# Patient Record
Sex: Male | Born: 1960 | Race: Black or African American | Hispanic: No | Smoking: Current every day smoker
Health system: Southern US, Community
[De-identification: ages and names within clinical notes are randomized; demographics above are authoritative.]

## PROBLEM LIST (undated history)

## (undated) DIAGNOSIS — I10 Essential (primary) hypertension: Secondary | ICD-10-CM

## (undated) DIAGNOSIS — R569 Unspecified convulsions: Secondary | ICD-10-CM

## (undated) DIAGNOSIS — F329 Major depressive disorder, single episode, unspecified: Secondary | ICD-10-CM

## (undated) DIAGNOSIS — F32A Depression, unspecified: Secondary | ICD-10-CM

## (undated) DIAGNOSIS — K279 Peptic ulcer, site unspecified, unspecified as acute or chronic, without hemorrhage or perforation: Secondary | ICD-10-CM

## (undated) DIAGNOSIS — F1010 Alcohol abuse, uncomplicated: Secondary | ICD-10-CM

## (undated) HISTORY — PX: TONSILLECTOMY: SUR1361

## (undated) HISTORY — PX: BACK SURGERY: SHX140

---

## 1992-01-09 HISTORY — PX: NECK SURGERY: SHX720

## 2006-09-20 ENCOUNTER — Emergency Department (HOSPITAL_COMMUNITY): Admission: EM | Admit: 2006-09-20 | Discharge: 2006-09-20 | Payer: Self-pay | Admitting: Emergency Medicine

## 2007-10-08 ENCOUNTER — Emergency Department (HOSPITAL_COMMUNITY): Admission: EM | Admit: 2007-10-08 | Discharge: 2007-10-09 | Payer: Self-pay | Admitting: Emergency Medicine

## 2007-10-25 ENCOUNTER — Emergency Department (HOSPITAL_COMMUNITY): Admission: EM | Admit: 2007-10-25 | Discharge: 2007-10-26 | Payer: Self-pay | Admitting: Emergency Medicine

## 2009-05-16 ENCOUNTER — Emergency Department (HOSPITAL_COMMUNITY): Admission: EM | Admit: 2009-05-16 | Discharge: 2009-05-16 | Payer: Self-pay | Admitting: Emergency Medicine

## 2010-03-28 LAB — CBC
HCT: 40.8 % (ref 39.0–52.0)
Hemoglobin: 14.1 g/dL (ref 13.0–17.0)
WBC: 5.2 10*3/uL (ref 4.0–10.5)

## 2010-03-28 LAB — HEPATIC FUNCTION PANEL
ALT: 27 U/L (ref 0–53)
Albumin: 3.5 g/dL (ref 3.5–5.2)
Bilirubin, Direct: 0.2 mg/dL (ref 0.0–0.3)
Indirect Bilirubin: 1 mg/dL — ABNORMAL HIGH (ref 0.3–0.9)
Total Bilirubin: 1.2 mg/dL (ref 0.3–1.2)

## 2010-03-28 LAB — POCT I-STAT, CHEM 8
BUN: 9 mg/dL (ref 6–23)
Calcium, Ion: 1.18 mmol/L (ref 1.12–1.32)
Chloride: 103 mEq/L (ref 96–112)
Creatinine, Ser: 0.8 mg/dL (ref 0.4–1.5)
Hemoglobin: 15.6 g/dL (ref 13.0–17.0)
Potassium: 3.7 mEq/L (ref 3.5–5.1)
Sodium: 138 mEq/L (ref 135–145)
TCO2: 27 mmol/L (ref 0–100)

## 2010-03-28 LAB — URINALYSIS, ROUTINE W REFLEX MICROSCOPIC
Glucose, UA: NEGATIVE mg/dL
Nitrite: NEGATIVE
Urobilinogen, UA: 0.2 mg/dL (ref 0.0–1.0)

## 2010-03-28 LAB — URINE CULTURE
Colony Count: NO GROWTH
Culture: NO GROWTH

## 2010-03-28 LAB — POCT CARDIAC MARKERS

## 2010-03-28 LAB — DIFFERENTIAL
Basophils Relative: 0 % (ref 0–1)
Eosinophils Absolute: 0.2 10*3/uL (ref 0.0–0.7)
Lymphocytes Relative: 16 % (ref 12–46)

## 2010-09-18 ENCOUNTER — Encounter: Payer: Self-pay | Admitting: Gastroenterology

## 2010-09-24 ENCOUNTER — Emergency Department (HOSPITAL_COMMUNITY)
Admission: EM | Admit: 2010-09-24 | Discharge: 2010-09-24 | Disposition: A | Discharge status: Home / Self Care | Payer: Self-pay | Attending: Emergency Medicine | Admitting: Emergency Medicine

## 2010-09-24 ENCOUNTER — Emergency Department (HOSPITAL_COMMUNITY): Payer: Self-pay

## 2010-09-24 DIAGNOSIS — S0990XA Unspecified injury of head, initial encounter: Secondary | ICD-10-CM | POA: Insufficient documentation

## 2010-09-24 DIAGNOSIS — Z87891 Personal history of nicotine dependence: Secondary | ICD-10-CM | POA: Insufficient documentation

## 2010-09-24 DIAGNOSIS — S02401A Maxillary fracture, unspecified, initial encounter for closed fracture: Secondary | ICD-10-CM | POA: Insufficient documentation

## 2010-09-24 DIAGNOSIS — Z79899 Other long term (current) drug therapy: Secondary | ICD-10-CM | POA: Insufficient documentation

## 2010-09-24 DIAGNOSIS — S0280XA Fracture of other specified skull and facial bones, unspecified side, initial encounter for closed fracture: Secondary | ICD-10-CM | POA: Insufficient documentation

## 2010-09-24 DIAGNOSIS — T797XXA Traumatic subcutaneous emphysema, initial encounter: Secondary | ICD-10-CM | POA: Insufficient documentation

## 2010-09-24 DIAGNOSIS — R209 Unspecified disturbances of skin sensation: Secondary | ICD-10-CM | POA: Insufficient documentation

## 2010-09-24 DIAGNOSIS — H539 Unspecified visual disturbance: Secondary | ICD-10-CM | POA: Insufficient documentation

## 2010-09-24 DIAGNOSIS — I10 Essential (primary) hypertension: Secondary | ICD-10-CM | POA: Insufficient documentation

## 2010-10-09 LAB — URINALYSIS, ROUTINE W REFLEX MICROSCOPIC
Bilirubin Urine: NEGATIVE
Glucose, UA: NEGATIVE
Protein, ur: NEGATIVE
Specific Gravity, Urine: 1.008
Urobilinogen, UA: 1
pH: 5.5

## 2010-10-09 LAB — CBC
Platelets: 230
RBC: 4.57
WBC: 7

## 2010-10-09 LAB — DIFFERENTIAL
Basophils Absolute: 0
Monocytes Relative: 6

## 2010-10-09 LAB — COMPREHENSIVE METABOLIC PANEL
ALT: 26
AST: 45 — ABNORMAL HIGH
Alkaline Phosphatase: 69
BUN: 4 — ABNORMAL LOW
GFR calc non Af Amer: >60
Total Bilirubin: 1.5 — ABNORMAL HIGH
Total Protein: 7.2

## 2010-10-09 LAB — ETHANOL: Alcohol, Ethyl (B): 276 — ABNORMAL HIGH

## 2010-10-09 LAB — LIPASE, BLOOD: Lipase: 34

## 2010-10-20 LAB — COMPREHENSIVE METABOLIC PANEL
Albumin: 3.4 — ABNORMAL LOW
CO2: 27
Calcium: 9
GFR calc non Af Amer: >60
Glucose, Bld: 98
Sodium: 139

## 2013-06-04 ENCOUNTER — Encounter (HOSPITAL_COMMUNITY): Payer: Self-pay | Admitting: Emergency Medicine

## 2013-06-04 ENCOUNTER — Emergency Department (HOSPITAL_COMMUNITY): Payer: Self-pay

## 2013-06-04 ENCOUNTER — Emergency Department (HOSPITAL_COMMUNITY)
Admission: EM | Admit: 2013-06-04 | Discharge: 2013-06-04 | Disposition: A | Discharge status: Home / Self Care | Payer: Self-pay | Attending: Emergency Medicine | Admitting: Emergency Medicine

## 2013-06-04 DIAGNOSIS — R079 Chest pain, unspecified: Secondary | ICD-10-CM | POA: Insufficient documentation

## 2013-06-04 DIAGNOSIS — W3400XD Accidental discharge from unspecified firearms or gun, subsequent encounter: Secondary | ICD-10-CM

## 2013-06-04 DIAGNOSIS — G8911 Acute pain due to trauma: Secondary | ICD-10-CM | POA: Insufficient documentation

## 2013-06-04 HISTORY — DX: Essential (primary) hypertension: I10

## 2013-06-04 NOTE — ED Notes (Addendum)
MD at bedside. Resident  
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
## 2014-04-05 ENCOUNTER — Emergency Department (HOSPITAL_COMMUNITY)
Admission: EM | Admit: 2014-04-05 | Discharge: 2014-04-05 | Disposition: A | Discharge status: Home / Self Care | Payer: Self-pay | Attending: Emergency Medicine | Admitting: Emergency Medicine

## 2014-04-05 ENCOUNTER — Encounter (HOSPITAL_COMMUNITY): Payer: Self-pay | Admitting: Emergency Medicine

## 2014-04-05 ENCOUNTER — Emergency Department (HOSPITAL_COMMUNITY): Payer: Self-pay

## 2014-04-05 DIAGNOSIS — S6991XA Unspecified injury of right wrist, hand and finger(s), initial encounter: Secondary | ICD-10-CM | POA: Insufficient documentation

## 2014-04-05 DIAGNOSIS — Y9389 Activity, other specified: Secondary | ICD-10-CM | POA: Insufficient documentation

## 2014-04-05 DIAGNOSIS — S0031XA Abrasion of nose, initial encounter: Secondary | ICD-10-CM | POA: Insufficient documentation

## 2014-04-05 DIAGNOSIS — Y92410 Unspecified street and highway as the place of occurrence of the external cause: Secondary | ICD-10-CM | POA: Insufficient documentation

## 2014-04-05 DIAGNOSIS — S0993XA Unspecified injury of face, initial encounter: Secondary | ICD-10-CM | POA: Insufficient documentation

## 2014-04-05 DIAGNOSIS — R519 Headache, unspecified: Secondary | ICD-10-CM

## 2014-04-05 DIAGNOSIS — I10 Essential (primary) hypertension: Secondary | ICD-10-CM | POA: Insufficient documentation

## 2014-04-05 DIAGNOSIS — M25531 Pain in right wrist: Secondary | ICD-10-CM

## 2014-04-05 DIAGNOSIS — Y998 Other external cause status: Secondary | ICD-10-CM | POA: Insufficient documentation

## 2014-04-05 DIAGNOSIS — R51 Headache: Secondary | ICD-10-CM

## 2014-04-05 DIAGNOSIS — IMO0002 Reserved for concepts with insufficient information to code with codable children: Secondary | ICD-10-CM

## 2014-04-05 DIAGNOSIS — M549 Dorsalgia, unspecified: Secondary | ICD-10-CM

## 2014-04-05 DIAGNOSIS — S3992XA Unspecified injury of lower back, initial encounter: Secondary | ICD-10-CM | POA: Insufficient documentation

## 2014-04-05 DIAGNOSIS — Z720 Tobacco use: Secondary | ICD-10-CM | POA: Insufficient documentation

## 2014-04-05 MED ORDER — HYDROCODONE-ACETAMINOPHEN 5-325 MG PO TABS: 1.0000 | ORAL_TABLET | Freq: Four times a day (QID) | ORAL | Status: DC | PRN

## 2014-04-05 MED ORDER — OXYCODONE-ACETAMINOPHEN 5-325 MG PO TABS
2.0000 | ORAL_TABLET | Freq: Once | ORAL | Status: AC
  Administered 2014-04-05: 2 via ORAL
  Filled 2014-04-05: qty 2
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
## 2014-04-05 NOTE — Discharge Instructions (Signed)
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
## 2014-04-05 NOTE — ED Notes (Signed)
Patient transported to X-ray 

## 2014-08-06 ENCOUNTER — Encounter (HOSPITAL_COMMUNITY): Payer: Self-pay | Admitting: Emergency Medicine

## 2014-08-06 ENCOUNTER — Emergency Department (HOSPITAL_COMMUNITY)
Admission: EM | Admit: 2014-08-06 | Discharge: 2014-08-07 | Disposition: A | Discharge status: Other Acute Inpatient Hospital | Payer: Self-pay | Attending: Emergency Medicine | Admitting: Emergency Medicine

## 2014-08-06 DIAGNOSIS — F29 Unspecified psychosis not due to a substance or known physiological condition: Secondary | ICD-10-CM | POA: Insufficient documentation

## 2014-08-06 DIAGNOSIS — M549 Dorsalgia, unspecified: Secondary | ICD-10-CM | POA: Insufficient documentation

## 2014-08-06 DIAGNOSIS — F1210 Cannabis abuse, uncomplicated: Secondary | ICD-10-CM | POA: Insufficient documentation

## 2014-08-06 DIAGNOSIS — Z720 Tobacco use: Secondary | ICD-10-CM | POA: Insufficient documentation

## 2014-08-06 DIAGNOSIS — F1010 Alcohol abuse, uncomplicated: Secondary | ICD-10-CM | POA: Diagnosis present

## 2014-08-06 DIAGNOSIS — I10 Essential (primary) hypertension: Secondary | ICD-10-CM | POA: Insufficient documentation

## 2014-08-06 DIAGNOSIS — F333 Major depressive disorder, recurrent, severe with psychotic symptoms: Secondary | ICD-10-CM | POA: Diagnosis present

## 2014-08-06 DIAGNOSIS — F419 Anxiety disorder, unspecified: Secondary | ICD-10-CM | POA: Insufficient documentation

## 2014-08-06 LAB — COMPREHENSIVE METABOLIC PANEL
ALT: 45 U/L (ref 17–63)
ANION GAP: 9 (ref 5–15)
AST: 53 U/L — ABNORMAL HIGH (ref 15–41)
Albumin: 4.3 g/dL (ref 3.5–5.0)
Alkaline Phosphatase: 85 U/L (ref 38–126)
BILIRUBIN TOTAL: 1.2 mg/dL (ref 0.3–1.2)
BUN: 8 mg/dL (ref 6–20)
CO2: 29 mmol/L (ref 22–32)
Calcium: 9.7 mg/dL (ref 8.9–10.3)
Chloride: 103 mmol/L (ref 101–111)
Creatinine, Ser: 0.87 mg/dL (ref 0.61–1.24)
GFR calc Af Amer: >60 mL/min (ref >60)
GFR calc non Af Amer: >60 mL/min (ref >60)
GLUCOSE: 89 mg/dL (ref 65–99)
Potassium: 3.7 mmol/L (ref 3.5–5.1)
Sodium: 141 mmol/L (ref 135–145)
Total Protein: 8.4 g/dL — ABNORMAL HIGH (ref 6.5–8.1)

## 2014-08-06 LAB — RAPID URINE DRUG SCREEN, HOSP PERFORMED
Amphetamines: NOT DETECTED
Barbiturates: NOT DETECTED
Benzodiazepines: NOT DETECTED
COCAINE: NOT DETECTED
OPIATES: NOT DETECTED
TETRAHYDROCANNABINOL: POSITIVE — AB

## 2014-08-06 LAB — ETHANOL: Alcohol, Ethyl (B): <5 mg/dL (ref <5)

## 2014-08-06 LAB — SALICYLATE LEVEL

## 2014-08-06 LAB — CBC
HCT: 43.4 % (ref 39.0–52.0)
Hemoglobin: 14.5 g/dL (ref 13.0–17.0)
MCH: 30.4 pg (ref 26.0–34.0)
MCHC: 33.4 g/dL (ref 30.0–36.0)
MCV: 91 fL (ref 78.0–100.0)
PLATELETS: 244 10*3/uL (ref 150–400)
RBC: 4.77 MIL/uL (ref 4.22–5.81)
RDW: 15.3 % (ref 11.5–15.5)
WBC: 5.6 10*3/uL (ref 4.0–10.5)

## 2014-08-06 LAB — ACETAMINOPHEN LEVEL: Acetaminophen (Tylenol), Serum: <10 ug/mL — ABNORMAL LOW (ref 10–30)

## 2014-08-06 MED ORDER — LORAZEPAM 0.5 MG PO TABS: 1.0000 mg | ORAL_TABLET | Freq: Three times a day (TID) | ORAL | Status: DC | PRN

## 2014-08-06 MED ORDER — CLONIDINE HCL 0.1 MG PO TABS
0.1000 mg | ORAL_TABLET | Freq: Once | ORAL | Status: AC
  Administered 2014-08-06: 0.1 mg via ORAL
  Filled 2014-08-06: qty 1

## 2014-08-06 MED ORDER — ACETAMINOPHEN 325 MG PO TABS: 650.0000 mg | ORAL_TABLET | ORAL | Status: DC | PRN
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
## 2014-08-06 NOTE — ED Notes (Signed)
Pt. To [facility] from ED ambulatory without difficulty, to room 37 . Report from Frances RN. Pt. Is alert and oriented, warm and dry in no distress. Pt. Denies SI, HI, and AVH. Pt. Calm and cooperative. Pt. Made aware of security cameras and Q15 minute rounds. Pt. Encouraged to let Nursing staff know of any concerns or needs.  

## 2014-08-06 NOTE — ED Notes (Signed)
Telepsych in progress with Ala Dach.

## 2014-08-06 NOTE — BH Assessment (Signed)
Informed EDP that the examination and recommendation to determine necessity for involuntary commitment will need to be completed prior to pt being accepted to [facility].

## 2014-08-06 NOTE — ED Notes (Signed)
Pt arrived under escort of [employer] under IVC paperwork from [place].  Pt's paperwork states that he has auditory hallucinations telling him to hurt others.  According to paperwork the pt has mood disorder, alcohol dependence, and cocaine addiction.  Pt also has hypertension.  Pt does take prescribed medication but has not been compliant with said.

## 2014-08-06 NOTE — ED Notes (Signed)
Pt. Noted sleeping in room. No complaints or concerns voiced. No distress or abnormal behavior noted. Will continue to monitor with security cameras. Q 15 minute rounds continue. 

## 2014-08-06 NOTE — ED Notes (Signed)
Sandwich and soft drink given.  

## 2014-08-06 NOTE — ED Provider Notes (Addendum)
CSN: [id number]     Arrival date & time 08/06/14  1949 History   First MD Initiated Contact with Patient 08/06/14 2051     No chief complaint on file.    (Consider location/radiation/quality/duration/timing/severity/associated sxs/prior Treatment) HPI Comments: Patient with a history of hypertension mood disorder alcohol dependence and cocaine abuse presents with hallucinations. He initially was seen at [facility] and IVC was done. He was sent over here for further evaluation. Patient states that he is having hallucinations. He seen people that are not there. He states voices are telling him to hurt other people. He denies any suicidal ideations. He has some pain in his lower back which he says it's been there about 6 months and is unchanged from his baseline. He denies any recent injuries. He denies any other physical complaints.   Past Medical History  Diagnosis Date  . Hypertension    Past Surgical History  Procedure Laterality Date  . Tonsillectomy    . 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    No family history on file. History  Substance Use Topics  . Smoking status: Current Some Day Smoker -- 0.50 packs/day    Types: Cigarettes  . Smokeless tobacco: Not on file  . Alcohol Use: 3.6 oz/week    6 Cans of beer per week     Comment: every other day     Review of Systems  Constitutional: Negative for fever, chills, diaphoresis and fatigue.  HENT: Negative for congestion, rhinorrhea and sneezing.   Eyes: Negative.   Respiratory: Negative for cough, chest tightness and shortness of breath.   Cardiovascular: Negative for chest pain and leg swelling.  Gastrointestinal: Negative for nausea, vomiting, abdominal pain, diarrhea and blood in stool.  Genitourinary: Negative for frequency, hematuria, flank pain and difficulty urinating.  Musculoskeletal: Positive for back pain. Negative for arthralgias.   Skin: Negative for rash.  Neurological: Negative for dizziness, speech difficulty, weakness, numbness and headaches.  Psychiatric/Behavioral: Positive for hallucinations. Negative for suicidal ideas. The patient is nervous/anxious.       Allergies  Review of patient's allergies indicates no known allergies.  Home Medications   Prior to Admission medications   Medication Sig Start Date End Date Taking? Authorizing Provider  PRESCRIPTION MEDICATION Take 1 tablet by mouth daily. Blood pressure medication   Yes Historical Provider, MD  HYDROcodone-acetaminophen (NORCO/VICODIN) 5-325 MG per tablet Take 1 tablet by mouth every 6 (six) hours as needed. Patient not taking: Reported on 08/06/2014 04/05/14   Roxy Horseman, PA-C   BP 158/94 mmHg  Pulse 70  Temp(Src) 98.3 F (36.8 C) (Oral)  Resp 18  Ht 5\' 10"  (1.778 m)  Wt 177 lb (80.287 kg)  BMI 25.40 kg/m2  SpO2 100% Physical Exam  Constitutional: He is oriented to person, place, and time. He appears well-developed and well-nourished.  HENT:  Head: Normocephalic and atraumatic.  Eyes: Pupils are equal, round, and reactive to light.  Neck: Normal range of motion. Neck supple.  Cardiovascular: Normal rate, regular rhythm and normal heart sounds.   Pulmonary/Chest: Effort normal and breath sounds normal. No respiratory distress. He has no wheezes. He has no rales. He exhibits no tenderness.  Abdominal: Soft. Bowel sounds are normal. There is no tenderness. There is no rebound and no guarding.  Musculoskeletal: Normal range of motion. He exhibits no edema.  Mild tenderness to the mid and lower lumbar spine. There  is no step-offs or deformities.  Lymphadenopathy:    He has no cervical adenopathy.  Neurological: He is alert and oriented to person, place, and time.  He has normal sensation and motor function.  Skin: Skin is warm and dry. No rash noted.  Psychiatric: His affect is labile. He is actively hallucinating. Thought content is  paranoid. He expresses homicidal ideation.    ED Course  Procedures (including critical care time) Labs Review Results for orders placed or performed during the hospital encounter of 08/06/14  Comprehensive metabolic panel  Result Value Ref Range   Sodium 141 135 - 145 mmol/L   Potassium 3.7 3.5 - 5.1 mmol/L   Chloride 103 101 - 111 mmol/L   CO2 29 22 - 32 mmol/L   Glucose, Bld 89 65 - 99 mg/dL   BUN 8 6 - 20 mg/dL   Creatinine, Ser 1.61 0.61 - 1.24 mg/dL   Calcium 9.7 8.9 - 09.6 mg/dL   Total Protein 8.4 (H) 6.5 - 8.1 g/dL   Albumin 4.3 3.5 - 5.0 g/dL   AST 53 (H) 15 - 41 U/L   ALT 45 17 - 63 U/L   Alkaline Phosphatase 85 38 - 126 U/L   Total Bilirubin 1.2 0.3 - 1.2 mg/dL   GFR calc non Af Amer >60 >60 mL/min   GFR calc Af Amer >60 >60 mL/min   Anion gap 9 5 - 15  Ethanol (ETOH)  Result Value Ref Range   Alcohol, Ethyl (B) <5 <5 mg/dL  Salicylate level  Result Value Ref Range   Salicylate Lvl <4.0 2.8 - 30.0 mg/dL  Acetaminophen level  Result Value Ref Range   Acetaminophen (Tylenol), Serum <10 (L) 10 - 30 ug/mL  CBC  Result Value Ref Range   WBC 5.6 4.0 - 10.5 K/uL   RBC 4.77 4.22 - 5.81 MIL/uL   Hemoglobin 14.5 13.0 - 17.0 g/dL   HCT 04.5 40.9 - 81.1 %   MCV 91.0 78.0 - 100.0 fL   MCH 30.4 26.0 - 34.0 pg   MCHC 33.4 30.0 - 36.0 g/dL   RDW 91.4 78.2 - 95.6 %   Platelets 244 150 - 400 K/uL  Urine rapid drug screen (hosp performed) (Not at [facility])  Result Value Ref Range   Opiates NONE DETECTED NONE DETECTED   Cocaine NONE DETECTED NONE DETECTED   Benzodiazepines NONE DETECTED NONE DETECTED   Amphetamines NONE DETECTED NONE DETECTED   Tetrahydrocannabinol POSITIVE (A) NONE DETECTED   Barbiturates NONE DETECTED NONE DETECTED   No results found.    Imaging Review No results found.   EKG Interpretation   Date/Time:  Friday August 06 2014 21:45:57 EDT Ventricular Rate:  69 PR Interval:  212 QRS Duration: 85 QT Interval:  439 QTC Calculation: 470 R Axis:    50 Text Interpretation:  Sinus rhythm Prolonged PR interval Probable  anteroseptal infarct, old Minimal ST elevation, inferior leads Baseline  wander in lead(s) V2 No old tracing to compare Confirmed by Nyquan Selbe  MD,  Brittni Hult ([id number]) on 08/06/2014 11:11:16 PM      MDM   Final diagnoses:  Psychosis, unspecified psychosis type    Patient has been cleared medically. He will be sent back to the psych ED. He's pending TTS evaluation. His blood pressure is elevated. He's asymptomatic from this. I will give him a dose of clonidine and the pharmacist can attempt to find out what his chronic medication is.  Pt has been accepted to [facility] by Dr. Elna Breslow.  Repeat BP is 158/94.  No CP, SOB or other concerning symptoms.    Rolan Bucco, MD 08/06/14 [id number]  Rolan Bucco, MD 08/06/14 2137  Rolan Bucco, MD 08/06/14 [phone]

## 2014-08-06 NOTE — BH Specialist Note (Signed)
Received notification of TTS consult request. Spoke to Rodney Bucco, MD who said Pt has a mood disorder, alcohol abuse and is experiencing hallucination . Tele-assessment will be initiated.  Harlin Rain Patsy [place], LPC, [facility], [facility] Triage Specialist [phone]

## 2014-08-07 ENCOUNTER — Encounter (HOSPITAL_COMMUNITY): Payer: Self-pay | Admitting: Family

## 2014-08-07 ENCOUNTER — Encounter (HOSPITAL_COMMUNITY): Payer: Self-pay | Admitting: *Deleted

## 2014-08-07 ENCOUNTER — Inpatient Hospital Stay (HOSPITAL_COMMUNITY)
Admission: EM | Admit: 2014-08-07 | Discharge: 2014-08-12 | DRG: 885 | Disposition: A | Discharge status: Home / Self Care | Payer: Federal, State, Local not specified - Other | Source: Intra-hospital | Attending: Psychiatry | Admitting: Psychiatry

## 2014-08-07 DIAGNOSIS — G8929 Other chronic pain: Secondary | ICD-10-CM | POA: Diagnosis present

## 2014-08-07 DIAGNOSIS — F17210 Nicotine dependence, cigarettes, uncomplicated: Secondary | ICD-10-CM | POA: Diagnosis present

## 2014-08-07 DIAGNOSIS — Z9114 Patient's other noncompliance with medication regimen: Secondary | ICD-10-CM | POA: Diagnosis present

## 2014-08-07 DIAGNOSIS — F333 Major depressive disorder, recurrent, severe with psychotic symptoms: Secondary | ICD-10-CM | POA: Diagnosis present

## 2014-08-07 DIAGNOSIS — F29 Unspecified psychosis not due to a substance or known physiological condition: Secondary | ICD-10-CM | POA: Diagnosis present

## 2014-08-07 DIAGNOSIS — F1010 Alcohol abuse, uncomplicated: Secondary | ICD-10-CM | POA: Diagnosis present

## 2014-08-07 DIAGNOSIS — R45850 Homicidal ideations: Secondary | ICD-10-CM | POA: Diagnosis present

## 2014-08-07 DIAGNOSIS — G4700 Insomnia, unspecified: Secondary | ICD-10-CM | POA: Diagnosis present

## 2014-08-07 DIAGNOSIS — I10 Essential (primary) hypertension: Secondary | ICD-10-CM | POA: Diagnosis present

## 2014-08-07 DIAGNOSIS — R45851 Suicidal ideations: Secondary | ICD-10-CM | POA: Diagnosis present

## 2014-08-07 MED ORDER — LOPERAMIDE HCL 2 MG PO CAPS: 2.0000 mg | ORAL_CAPSULE | Freq: Once | ORAL | Status: DC

## 2014-08-07 MED ORDER — ACETAMINOPHEN 325 MG PO TABS
650.0000 mg | ORAL_TABLET | Freq: Four times a day (QID) | ORAL | Status: DC | PRN
  Administered 2014-08-07 – 2014-08-09 (×2): 650 mg via ORAL
  Filled 2014-08-07: qty 2
  Filled 2014-08-07: qty 1
  Filled 2014-08-07 (×2): qty 2

## 2014-08-07 MED ORDER — NICOTINE 21 MG/24HR TD PT24
21.0000 mg | MEDICATED_PATCH | Freq: Every day | TRANSDERMAL | Status: DC
  Administered 2014-08-07 – 2014-08-11 (×5): 21 mg via TRANSDERMAL
  Filled 2014-08-07: qty 1
  Filled 2014-08-07: qty 14
  Filled 2014-08-07 (×5): qty 1

## 2014-08-07 MED ORDER — NICOTINE 21 MG/24HR TD PT24
MEDICATED_PATCH | TRANSDERMAL | Status: AC
Start: 2014-08-07 — End: 2014-08-08
  Filled 2014-08-07: qty 1

## 2014-08-07 MED ORDER — IBUPROFEN 600 MG PO TABS
ORAL_TABLET | ORAL | Status: AC
  Filled 2014-08-07: qty 1

## 2014-08-07 MED ORDER — METHOCARBAMOL 500 MG PO TABS
500.0000 mg | ORAL_TABLET | Freq: Three times a day (TID) | ORAL | Status: AC
  Administered 2014-08-07 – 2014-08-10 (×9): 500 mg via ORAL
  Filled 2014-08-07 (×10): qty 1

## 2014-08-07 MED ORDER — MAGNESIUM HYDROXIDE 400 MG/5ML PO SUSP
30.0000 mL | Freq: Every day | ORAL | Status: DC | PRN
Start: 2014-08-07 — End: 2014-08-12

## 2014-08-07 MED ORDER — HYDROXYZINE HCL 25 MG PO TABS
25.0000 mg | ORAL_TABLET | ORAL | Status: DC | PRN
  Administered 2014-08-08 – 2014-08-12 (×3): 25 mg via ORAL
  Filled 2014-08-07 (×3): qty 1

## 2014-08-07 MED ORDER — TRAZODONE HCL 50 MG PO TABS
50.0000 mg | ORAL_TABLET | Freq: Every day | ORAL | Status: DC
  Administered 2014-08-07: 50 mg via ORAL
  Filled 2014-08-07 (×4): qty 1

## 2014-08-07 MED ORDER — LISINOPRIL 10 MG PO TABS
10.0000 mg | ORAL_TABLET | Freq: Every day | ORAL | Status: DC
  Administered 2014-08-07 – 2014-08-12 (×6): 10 mg via ORAL
  Filled 2014-08-07: qty 1
  Filled 2014-08-07: qty 14
  Filled 2014-08-07 (×5): qty 1
  Filled 2014-08-07: qty 2
  Filled 2014-08-07: qty 1

## 2014-08-07 MED ORDER — ALUM & MAG HYDROXIDE-SIMETH 200-200-20 MG/5ML PO SUSP: 30.0000 mL | ORAL | Status: DC | PRN

## 2014-08-07 MED ORDER — IBUPROFEN 800 MG PO TABS
800.0000 mg | ORAL_TABLET | Freq: Once | ORAL | Status: AC
  Administered 2014-08-07: 800 mg via ORAL
  Filled 2014-08-07: qty 1

## 2014-08-07 MED ORDER — LIDOCAINE 5 % EX PTCH
1.0000 | MEDICATED_PATCH | CUTANEOUS | Status: DC
  Administered 2014-08-07 – 2014-08-11 (×5): 1 via TRANSDERMAL
  Filled 2014-08-07: qty 14
  Filled 2014-08-07 (×7): qty 1

## 2014-08-07 MED ORDER — IBUPROFEN 600 MG PO TABS
600.0000 mg | ORAL_TABLET | Freq: Four times a day (QID) | ORAL | Status: DC | PRN
  Administered 2014-08-07 – 2014-08-12 (×8): 600 mg via ORAL
  Filled 2014-08-07 (×7): qty 1

## 2014-08-07 MED ORDER — LOPERAMIDE HCL 2 MG PO CAPS: 4.0000 mg | ORAL_CAPSULE | Freq: Once | ORAL | Status: DC

## 2014-08-07 NOTE — ED Notes (Signed)
Pt. Noted sleeping in room. No complaints or concerns voiced. No distress or abnormal behavior noted. Will continue to monitor with security cameras. Q 15 minute rounds continue. 

## 2014-08-07 NOTE — ED Notes (Addendum)
Pt OK to transport to [facility] per [employer]

## 2014-08-07 NOTE — ED Notes (Signed)
Up to the bathroom 

## 2014-08-07 NOTE — Progress Notes (Signed)
Pt is new to the unit, he stated that he was feeling ok.

## 2014-08-07 NOTE — ED Notes (Signed)
[facility] will call back for report 

## 2014-08-07 NOTE — ED Notes (Signed)
[employer] contacted for transport 
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
## 2014-08-07 NOTE — ED Notes (Addendum)
Pt ambulatory w/o difficulty to [facility] w/ [employer], belongings given to officers 

## 2014-08-07 NOTE — ED Notes (Signed)
[facility] unable to take report at this time, will call back 

## 2014-08-07 NOTE — Tx Team (Signed)
Initial Interdisciplinary Treatment Plan   PATIENT STRESSORS: Financial difficulties Health problems Substance abuse   PATIENT STRENGTHS: Ability for insight Communication skills Work skills   PROBLEM LIST: Problem List/Patient Goals Date to be addressed Date deferred Reason deferred Estimated date of resolution  Pt stated,"I need to speak to my attorney about this disability. I have an appointment with him in nSept."      Pt stated,"I have had back and neck surgery and some of my hip bone was placed into my neck.""I always live with pain."      Pt stated,"I drink a lot and have lost weight and have no appetite."                                           DISCHARGE CRITERIA:  Ability to meet basic life and health needs Motivation to continue treatment in a less acute level of care Safe-care adequate arrangements made  PRELIMINARY DISCHARGE PLAN: Attend aftercare/continuing care group Outpatient therapy Return to previous living arrangement  PATIENT/FAMIILY INVOLVEMENT: This treatment plan has been presented to and reviewed with the patient, [name], .  The patient has  been given the opportunity to ask questions and make suggestions.  [facility] 08/07/2014, 5:31 PM

## 2014-08-07 NOTE — ED Notes (Signed)
Pt. Noted in room. No complaints or concerns voiced. No distress or abnormal behavior noted. Will continue to monitor with security cameras. Q 15 minute rounds continue. 

## 2014-08-07 NOTE — ED Notes (Signed)
Dr J and Conrad NP into see 

## 2014-08-07 NOTE — ED Notes (Signed)
[facility] will call when pt can transport 

## 2014-08-07 NOTE — Progress Notes (Addendum)
Patient ID: Rodney Hartman, male   DOB: Sep 09, 1960, 54 y.o.   MRN: [id number] Pt is a 54 year old IVC patient who came to [facility] from the [facility]. Pt has NKDA. His medical hx includes: HTN, smoker and ETOH heavy times one year, back and neck surgery and a tonsillectomy. Pt was sent to the ED from [facility] for increased agitation and auditory and visual hallucinations. Presently he denies any hallucinations. Pt admits to drinking at least everyday times one year as much a 3 -40's a day. He resides with his sister and plans to return to the current living situation. Pt stated he has hired an Pensions consultant to try to get his disability situated. Pt was brought onto the unit and  Shown to his room. He appears pleasant and cooperative but does c/o significant back and neck pain a 8/10, Pt was given  of motrin and  of robaxin. NP made aware. Pt has a CIWA of a 3.

## 2014-08-07 NOTE — ED Notes (Signed)
Bed reassigned delay transport until ready per Marylu Lund RN

## 2014-08-08 DIAGNOSIS — R45851 Suicidal ideations: Secondary | ICD-10-CM

## 2014-08-08 DIAGNOSIS — F333 Major depressive disorder, recurrent, severe with psychotic symptoms: Principal | ICD-10-CM

## 2014-08-08 MED ORDER — ENSURE ENLIVE PO LIQD
237.0000 mL | Freq: Two times a day (BID) | ORAL | Status: DC
  Administered 2014-08-09 – 2014-08-11 (×3): 237 mL via ORAL

## 2014-08-08 MED ORDER — TRAZODONE HCL 50 MG PO TABS
50.0000 mg | ORAL_TABLET | Freq: Every evening | ORAL | Status: DC | PRN
  Administered 2014-08-08 – 2014-08-11 (×8): 50 mg via ORAL
  Filled 2014-08-08 (×11): qty 1

## 2014-08-08 MED ORDER — HALOPERIDOL 5 MG PO TABS
5.0000 mg | ORAL_TABLET | Freq: Every day | ORAL | Status: DC
  Administered 2014-08-08 – 2014-08-11 (×4): 5 mg via ORAL
  Filled 2014-08-08: qty 14
  Filled 2014-08-08 (×5): qty 1

## 2014-08-08 MED ORDER — BENZTROPINE MESYLATE 0.5 MG PO TABS
0.5000 mg | ORAL_TABLET | Freq: Every day | ORAL | Status: DC
  Administered 2014-08-08 – 2014-08-11 (×4): 0.5 mg via ORAL
  Filled 2014-08-08: qty 1
  Filled 2014-08-08: qty 14
  Filled 2014-08-08 (×4): qty 1

## 2014-08-08 MED ORDER — FLUOXETINE HCL 10 MG PO CAPS
10.0000 mg | ORAL_CAPSULE | Freq: Every day | ORAL | Status: DC
  Administered 2014-08-08 – 2014-08-12 (×5): 10 mg via ORAL
  Filled 2014-08-08: qty 14
  Filled 2014-08-08 (×7): qty 1

## 2014-08-08 NOTE — Progress Notes (Signed)
Writer observed patient up in the dayroom earlier before group watching tv and no interaction with peers. He reports having had a good day and has attended all groups. He c/o back and knee pain and received prn motrin before attending group. Writer informed him of medication changes for sleep aid and other that were added and he is agreeable to taking them. He denies si/hi/a/v hallucinations. Writer asked if he had any visitors today and he reported that he didn't want to see anyone and did not elaborate on it. Support and encouragement given, safety maintained on unit with 15 min checks.

## 2014-08-08 NOTE — Progress Notes (Signed)
NUTRITION ASSESSMENT  Pt identified as at risk on the Malnutrition Screen Tool  INTERVENTION: 1. Educated patient on the importance of nutrition and encouraged intake of food and beverages. 2. Discussed weight goals. 3. Supplements: Ensure Enlive po BID, each supplement provides 350 kcal and 20 grams of protein  NUTRITION DIAGNOSIS: Unintentional weight loss related to sub-optimal intake as evidenced by pt report.   Goal: Pt to meet >/= 90% of their estimated nutrition needs.  Monitor:  PO intake  Assessment:  PT admitted with depression and ETOH abuse. Pt reports 25 lb weight loss with decreased appetite. Pt would benefit form nutritional supplementation. RD to order.  Per weight history, pt has lost 26 lb since 3/28 (13% weight loss x 4 months, significant for time frame).   Height: Ht Readings from Last 1 Encounters:  08/07/14  (1.651 m)    Weight: Wt Readings from Last 1 Encounters:  08/07/14 172 lb (78.019 kg)    Weight Hx: Wt Readings from Last 10 Encounters:  08/07/14 172 lb (78.019 kg)  08/06/14 177 lb (80.287 kg)  04/05/14 198 lb (89.812 kg)  06/04/13 210 lb (95.255 kg)    BMI:  Body mass index is 28.62 kg/(m^2). Pt meets criteria for overweight based on current BMI.  Estimated Nutritional Needs: Kcal: 25-30 kcal/kg Protein: > 1 gram protein/kg Fluid: 1 ml/kcal  Diet Order: Diet regular Room service appropriate?: Yes; Fluid consistency:: Thin Pt is also offered choice of unit snacks mid-morning and mid-afternoon.  Pt is eating as desired.   Lab results and medications reviewed.   Tilda Franco, MS, RD, LDN Pager: [phone] After Hours Pager: [phone]
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
## 2014-08-08 NOTE — [facility] Group Notes (Signed)
[facility] Group Notes:  (Clinical Social Work)  08/08/2014  [facility] Group Notes:  (Clinical Social Work)  08/08/2014  11:00AM-12:00PM  Summary of Progress/Problems:  The main focus of today's process group was to listen to a variety of genres of music and to identify that different types of music provoke different responses.  The patient then was able to identify personally what was soothing for them, as well as energizing.  Handouts were used to record feelings evoked, as well as how patient can personally use this knowledge in sleep habits, with depression, and with other symptoms.  The patient expressed understanding of concepts, as well as knowledge of how each type of music affected him and how this can be used at home as a wellness/recovery tool.  He participated fully and with obvious understanding and enjoyment.  Type of Therapy:  Music Therapy   Participation Level:  Active  Participation Quality:  Attentive and Sharing  Affect:  Blunted  Cognitive:  Oriented  Insight:  Engaged  Engagement in Therapy:  Engaged  Modes of Intervention:   Activity, Exploration  Rodney Mantle, LCSW 08/08/2014

## 2014-08-08 NOTE — Discharge Instructions (Signed)

## 2014-08-08 NOTE — Progress Notes (Signed)
Writer spoke with patient 1:1 and he reported that he has had an ok day since being admitted. He was observed sitting in the dayroom watching tv and no interaction with peers. He c/o lower back pain earlier and received medication and heat pack. He was informed of his scheduled medication and agrees to take it. He reports that he would like to get help with his drinking. Writer encouraged him to let his social worker be aware of his this and to follow through with this. He denies si/hi/a/v hallucinations. Support and encouragement given, safety maintained on unit with 15 min checks.

## 2014-08-08 NOTE — [facility] Suicide Risk Assessment (Signed)
[facility] Admission Suicide Risk Assessment   Nursing information obtained from:  Patient Demographic factors:  Male, Low socioeconomic status Current Mental Status:    Loss Factors:  Decline in physical health Historical Factors:    Risk Reduction Factors:  Religious beliefs about death, Living with another person, especially a relative, Positive social support Total Time spent with patient: 45 minutes Principal Problem: MDD (major depressive disorder), recurrent, severe, with psychosis Diagnosis:   Patient Active Problem List   Diagnosis Date Noted  . Alcohol abuse [F10.10] 08/07/2014  . MDD (major depressive disorder), recurrent, severe, with psychosis [F33.3] 08/07/2014     Continued Clinical Symptoms:  Alcohol Use Disorder Identification Test Final Score (AUDIT): 25 The "Alcohol Use Disorders Identification Test", Guidelines for Use in Primary Care, Second Edition.  World Science writer [facility]). Score between 0-7:  no or low risk or alcohol related problems. Score between 8-15:  moderate risk of alcohol related problems. Score between 16-19:  high risk of alcohol related problems. Score 20 or above:  warrants further diagnostic evaluation for alcohol dependence and treatment.   CLINICAL FACTORS:   Depression:   Anhedonia Comorbid alcohol abuse/dependence Hopelessness Impulsivity Insomnia Severe Alcohol/Substance Abuse/Dependencies Chronic Pain More than one psychiatric diagnosis Currently Psychotic Unstable or Poor Therapeutic Relationship Previous Psychiatric Diagnoses and Treatments   Musculoskeletal: Strength & Muscle Tone: within normal limits Gait & Station: normal Patient leans: N/A  Psychiatric Specialty Exam: Physical Exam  ROS  Blood pressure 125/95, pulse 83, temperature 97.4 F (36.3 C), temperature source Oral, resp. rate 18, height  (1.651 m), weight 78.019 kg (172 lb), SpO2 100 %.Body mass index is 28.62 kg/(m^2).  General Appearance: Guarded   Eye Contact::  Fair  Speech:  Slow  Volume:  Decreased  Mood:  Anxious, Depressed, Dysphoric and Irritable  Affect:  Constricted, Depressed and Flat  Thought Process:  Linear  Orientation:  Full (Time, Place, and Person)  Thought Content:  Hallucinations: Auditory Visual, Paranoid Ideation and Rumination  Suicidal Thoughts:  Yes.  without intent/plan  Homicidal Thoughts:  Yes.  without intent/plan  Memory:  Immediate;   Fair Recent;   Fair Remote;   Fair  Judgement:  Impaired  Insight:  Lacking  Psychomotor Activity:  Restlessness  Concentration:  Fair  Recall:  [employer] of Knowledge:Fair  Language: Fair  Akathisia:  Yes  Handed:  Right  AIMS (if indicated):     Assets:  Communication Skills Desire for Improvement Financial Resources/Insurance  Sleep:     Cognition: WNL  ADL's:  Intact     COGNITIVE FEATURES THAT CONTRIBUTE TO RISK:  Closed-mindedness, Loss of executive function, Polarized thinking and Thought constriction (tunnel vision)    SUICIDE RISK:   Moderate:  Frequent suicidal ideation with limited intensity, and duration, some specificity in terms of plans, no associated intent, good self-control, limited dysphoria/symptomatology, some risk factors present, and identifiable protective factors, including available and accessible social support.  PLAN OF CARE: See admission note for more details  Medical Decision Making:  New problem, with additional work up planned, Review of Psycho-Social Stressors (1), Review or order clinical lab tests (1), Decision to obtain old records (1), Review and summation of old records (2), Established Problem, Worsening (2), New Problem, with no additional work-up planned (3), Review of Medication Regimen & Side Effects (2) and Review of New Medication or Change in Dosage (2)  I certify that inpatient services furnished can reasonably be expected to improve the patient's condition.   ARFEEN,SYED T.  08/08/2014, 10:50 AM

## 2014-08-08 NOTE — Progress Notes (Signed)
Adult Psychoeducational Group Note  Date:  08/08/2014 Time:  9:15 PM  Group Topic/Focus:  Wrap-Up Group:   The focus of this group is to help patients review their daily goal of treatment and discuss progress on daily workbooks.  Participation Level:  Active  Participation Quality:  Appropriate  Affect:  Appropriate  Cognitive:  Appropriate  Insight: Appropriate  Engagement in Group:  Engaged  Modes of Intervention:  Discussion  Additional Comments: The patient expressed that he had a good day.The patient also said that his back issues have bothered him today.  Octavio Manns 08/08/2014, 9:15 PM

## 2014-08-08 NOTE — Progress Notes (Signed)
Patient ID: Rodney Hartman, male   DOB: 1960/01/10, 54 y.o.   MRN: [id number]   D: Pt has been very flat and depressed on the unit today. Pt reported that he was depressed and that he was in a lot of pain. Pt also reported that he did not sleep well last night. Dr. Lolly Mustache made aware of patients complaints, new orders were noted. Pt reported that his depression was 6, his hopelessness was a 0, and his anxiety was a 7. Pt attended groups and engaged in treatment.  Pt reported being negative SI/HI, no AH/VH noted. A: 15 min checks continued for patient safety. R: Pt safety maintained.

## 2014-08-08 NOTE — [facility] Group Notes (Signed)
[facility] Group Notes:  (Nursing/MHT/Case Management/Adjunct)  Date:  08/08/2014  Time:  10:28 AM  Type of Therapy:  Psychoeducational Skills  Participation Level:  Did Not Attend  Participation Quality:  Did Not Attend  Affect:  Did Not Attend  Cognitive:  Did Not Attend  Insight:  None  Engagement in Group:  Did Not Attend  Modes of Intervention:  Did Not Attend  Summary of Progress/Problems: Pt did not attend patient self inventory group.   Jacquelyne Balint Shanta 08/08/2014, 10:28 AM

## 2014-08-09 NOTE — [facility] Group Notes (Signed)
[facility] LCSW Aftercare Discharge Planning Group Note   08/09/2014 10:41 AM   Patient was invited to attend group this morning but declined; stating, "my head is still hurting".   Liliana Cline

## 2014-08-09 NOTE — Tx Team (Signed)
Date: 08/09/2014 12:45 PM Progress in Treatment:  Attending groups: Yes  Participating in groups: Yes Taking medication as prescribed: Yes  Tolerating medication: Yes  Family/Significant othe contact made: No  Patient understands diagnosis: Yes  AEB asking for help with detox and getting into rehab Discussing patient identified problems/goals with staff:  Medical problems stabilized or resolved: Active signs of withdrawal Denies suicidal/homicidal ideation: Yes Patient has not harmed self or Others: Yes  New problem(s) identified: Discharge Plan or Barriers: Patient hopes to go to [facility] at dc but understands they may have a waiting list. CSW proposed a back up plan- possibly at [facility], and patient is agreeable.  Prozac, Haldol PO. Atarax PRN  Additional comments: Pt arrived under escort of [employer] under IVC paperwork from [facility]. Pt's paperwork states that he has auditory hallucinations telling him to hurt others. According to paperwork the pt has mood disorder, alcohol dependence, and cocaine addiction. Pt also has hypertension. Pt does take prescribed medication but has not been compliant with said.   Reports having headache (secondary to etoh withdrawal) Reason for Continuation of Hospitalization:   Severe depression  psychosis  hallucination  homicidal ideation Medical Issues Medication stabilization Withdrawal symptoms Psychotic Estimated length of stay: 3-5 days Review of initial/current patient goals per problem list:  1. Goal(s): Patient will participate in aftercare plan  Met: No  Target date:  As evidenced by: Patient will participate within aftercare plan AEB aftercare provider and housing plan at discharge being identified.  08/09/2014  Pt asking for referral to [facility] and [facility]-signed releases for both 2. Goal (s): Patient will exhibit decreased depressive symptoms and suicidal ideations.  Met: No  Target date:  As evidenced by: Patient will utilize self rating of  depression at 3 or below and demonstrate decreased signs of depression or be deemed stable for discharge by MD. 08/09/2014 Pt rates his depression a 5 today. 4. Goal(s): Patient will demonstrate decreased signs of withdrawal due to substance abuse  Met: Yes  Target date:  As evidenced by: Patient will produce a CIWA/COWS score of 0, have stable vitals signs, and no symptoms of withdrawal 08/09/2014  5. Goal(s): Patient will demonstrate decreased signs of psychosis   Met: No  Target date:  As evidenced by: Patient will demonstrate decreased frequency of AVH or return to baseline function 08/09/2014 Pt is c/o of AH today.     Patient:    Family:    Physician: Dr. Aneta Mins, MD  08/09/2014 12:45 PM  Nursing:  Desma Paganini  08/09/2014 12:45 PM  Clinical Social Worker Alzena Gerber Rose, [place] 08/09/2014 12:45 PM  Other: Jake Bathe Liasion 08/09/2014 12:45 PM  Clinical: RN 08/09/2014 12:45 PM  Other: , RN Charge Nurse 08/09/2014 12:45 PM  Other:       Eduard Clos, MSW, LCSWA

## 2014-08-09 NOTE — Progress Notes (Signed)
Patient ID: Rodney Hartman, male   DOB: March 07, 1960, 54 y.o.   MRN: [id number]  D: Patient pleasant and cooperative on approach tonight. Reports mood is better tonight. Did have a verbal confrontation with a psychotic peer but redirected. Denies any distress tonight.  A: Staff will monitor on q 15 minute checks, follow treatment plan, and give meds as ordered. R: Cooperative on the unit. Taking medications without issue

## 2014-08-09 NOTE — Progress Notes (Signed)
[facility] MD Progress Note  08/09/2014 3:05 PM Rodney Hartman  MRN:  [id number] Subjective:  Pt interviewed. Chart reviewed. Pt reports feeling the same as yesterday. This morning, he had a headache (improved now) and knee pain. Pt reports muscle cramps for few months. Pt lives with sister. Pt reports a peer wanted to fight him in the cafeteria, but pt stayed calm. Mood still depressed. Appetite improved. Sleeps well. Tolerating meds well. Pt denies SI/HI/VH/paranoia. Pt heard 1 voice this AM stating "to do something", but it was not clear. Pt last drank alcohol 4 days ago, and was drinking 3-4 40 oz beer per day. Pt reports having mild tremor this AM, now resolved. No other withdrawal symptoms reported.    Principal Problem: MDD (major depressive disorder), recurrent, severe, with psychosis Diagnosis:   Patient Active Problem List   Diagnosis Date Noted  . Alcohol abuse [F10.10] 08/07/2014  . MDD (major depressive disorder), recurrent, severe, with psychosis [F33.3] 08/07/2014   Total Time spent with patient: 30 minutes   Past Medical History:  Past Medical History  Diagnosis Date  . Hypertension     Past Surgical History  Procedure Laterality Date  . Tonsillectomy    . 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    Family History: History reviewed. No pertinent family history. Social History:  History  Alcohol Use  . 3.6 oz/week  . 6 Cans of beer per week    Comment: every other day      History  Drug Use No    History   Social History  . Marital Status: Legally Separated    Spouse Name: N/A  . Number of Children: N/A  . Years of Education: N/A   Social History Main Topics  . Smoking status: Current Some Day Smoker -- 0.50 packs/day    Types: Cigarettes  . Smokeless tobacco: Not on file  . Alcohol Use: 3.6 oz/week    6 Cans of beer per week     Comment: every other day   . Drug Use: No  . Sexual  Activity: Not on file   Other Topics Concern  . None   Social History Narrative   Additional History:    Sleep: Good  Appetite:  Good   Assessment:   Musculoskeletal: Strength & Muscle Tone: within normal limits Gait & Station: normal Patient leans: N/A   Psychiatric Specialty Exam: Physical Exam  ROS  Blood pressure 109/84, pulse 71, temperature 97.6 F (36.4 C), temperature source Oral, resp. rate 20, height 5\' 5"  (1.651 m), weight 78.019 kg (172 lb), SpO2 100 %.Body mass index is 28.62 kg/(m^2).  General Appearance: Casual and Fairly Groomed  Patent attorney::  Fair  Speech:  Normal Rate  Volume:  Normal  Mood:  Depressed  Affect:  Congruent and Depressed  Thought Process:  Coherent and Goal Directed  Orientation:  Full (Time, Place, and Person)  Thought Content:  Hallucinations: Auditory Command:  "to do something"  Suicidal Thoughts:  No  Homicidal Thoughts:  No  Memory:  Immediate;   Fair Recent;   Fair Remote;   Fair  Judgement:  Intact  Insight:  Lacking  Psychomotor Activity:  Normal  Concentration:  Fair  Recall:  [employer] of Knowledge:Fair  Language: Fair  Akathisia:  Negative  Handed:  Right  AIMS (if indicated):     Assets:  Communication Skills Desire  for Improvement  ADL's:  Intact  Cognition: WNL  Sleep:        Current Medications: Current Facility-Administered Medications  Medication Dose Route Frequency Provider Last Rate Last Dose  . acetaminophen (TYLENOL) tablet 650 mg  650 mg Oral Q6H PRN Beau Fanny, FNP   650 mg at 08/07/14 1947  . alum & mag hydroxide-simeth (MAALOX/MYLANTA) 200-200-20 MG/5ML suspension 30 mL  30 mL Oral Q4H PRN Beau Fanny, FNP      . benztropine (COGENTIN) tablet 0.5 mg  0.5 mg Oral QHS Cleotis Nipper, MD   0.5 mg at 08/08/14 2123  . feeding supplement (ENSURE ENLIVE) (ENSURE ENLIVE) liquid 237 mL  237 mL Oral BID BM Tilda Franco, RD   237 mL at 08/09/14 0938  . FLUoxetine (PROZAC) capsule 10 mg  10 mg  Oral Daily Cleotis Nipper, MD   10 mg at 08/09/14 0755  . haloperidol (HALDOL) tablet 5 mg  5 mg Oral QHS Cleotis Nipper, MD   5 mg at 08/08/14 2123  . hydrOXYzine (ATARAX/VISTARIL) tablet 25 mg  25 mg Oral Q4H PRN Sanjuana Kava, NP   25 mg at 08/08/14 0759  . ibuprofen (ADVIL,MOTRIN) tablet 600 mg  600 mg Oral Q6H PRN Sanjuana Kava, NP   600 mg at 08/09/14 0759  . lidocaine (LIDODERM) 5 % 1 patch  1 patch Transdermal Q24H Sanjuana Kava, NP   1 patch at 08/08/14 1900  . lisinopril (PRINIVIL,ZESTRIL) tablet 10 mg  10 mg Oral Daily Sanjuana Kava, NP   10 mg at 08/09/14 0755  . magnesium hydroxide (MILK OF MAGNESIA) suspension 30 mL  30 mL Oral Daily PRN Beau Fanny, FNP      . methocarbamol (ROBAXIN) tablet 500 mg  500 mg Oral TID Sanjuana Kava, NP   500 mg at 08/09/14 1203  . nicotine (NICODERM CQ - dosed in mg/24 hours) patch 21 mg  21 mg Transdermal Daily Sanjuana Kava, NP   21 mg at 08/09/14 0755  . traZODone (DESYREL) tablet 50 mg  50 mg Oral QHS,MR X 1 Cleotis Nipper, MD   50 mg at 08/08/14 2216    Lab Results: No results found for this or any previous visit (from the past 48 hour(s)).  Physical Findings: AIMS: Facial and Oral Movements Muscles of Facial Expression: None, normal Lips and Perioral Area: None, normal Jaw: None, normal Tongue: None, normal,Extremity Movements Upper (arms, wrists, hands, fingers): None, normal Lower (legs, knees, ankles, toes): None, normal, Trunk Movements Neck, shoulders, hips: None, normal, Overall Severity Severity of abnormal movements (highest score from questions above): None, normal Incapacitation due to abnormal movements: None, normal Patient's awareness of abnormal movements (rate only patient's report): No Awareness, Dental Status Current problems with teeth and/or dentures?: Yes Does patient usually wear dentures?: No  CIWA:  CIWA-Ar Total: 3 COWS:     Treatment Plan Summary: Daily contact with patient to assess and evaluate symptoms and  progress in treatment, Medication management and Plan continue cogentin, prozac, and haldol. pt requesting rehab upon discharge.    Medical Decision Making:  Established Problem, Stable/Improving (1) and Review of Medication Regimen & Side Effects (2)     Endrit Gittins 08/09/2014, 3:05 PM

## 2014-08-09 NOTE — Plan of Care (Signed)
Problem: Ineffective individual coping Goal: STG: Patient will remain free from self harm Outcome: Progressing Pt denies si thoughts Goal: STG: Patient will participate in after care plan Outcome: Not Progressing Pt did not attend group this morning. He stayed in bed with complaints of back and headache.

## 2014-08-09 NOTE — Progress Notes (Signed)
D:Pt c/o headache and backache this morning. He rates anxiety and depression as a 5 on 1-10 scale with 10 being the most. Pt has been in his bed with physical complaints and not attending group this morning. A:Offered support and 15 minute checks. Gave prn medication for pain along with ice pack. R:Pt is resting in his room. Respirations are even and unlabored. Safety maintained on the unit.

## 2014-08-09 NOTE — [facility] Group Notes (Signed)
[facility] LCSW Group Therapy  08/09/2014 3:02 PM  Type of Therapy:  Group Therapy  Participation Level:  Active  Participation Quality:  Appropriate and Supportive  Affect:  Appropriate and Flat  Cognitive:  Appropriate  Insight:  Developing/Improving and Engaged  Engagement in Therapy:  Engaged and Improving  Modes of Intervention:  Discussion, Education, Exploration, Problem-solving and Support  Summary of Progress/Problems: Patient spoke up early on in group and shared his insight regarding his etoh use and the obstacles there he is facing. Patient was able to voice how the etoh use leads to inability to work and thus inability to pay bills. "I'm ready for a fresh start". Patient shared with group that he was sober for 8 months and was at a rescue mission in [place]. He is hoping to do a similar program in [place] at [employer].  He has found the faith-based support and the "bible" are helpful.   Today's Topic: Overcoming Obstacles. Patients identified one short term goal and potential obstacles in reaching this goal. Patients processed barriers involved in overcoming these obstacles. Patients identified steps necessary for overcoming these obstacles and explored motivation (internal and external) for facing these difficulties head on. Today's Topic: Overcoming Obstacles. Patients identified one short term goal and potential obstacles in reaching this goal. Patients processed barriers involved in overcoming these obstacles. Patients identified steps necessary for overcoming these obstacles and explored motivation (internal and external) for facing these difficulties head on.     Liliana Cline 08/09/2014, 3:02 PM

## 2014-08-10 NOTE — Progress Notes (Signed)
Patient ID: Rodney Hartman, male   DOB: 1960/08/03, 54 y.o.   MRN: [id number] Adult Psychoeducational Group Note  Date:  08/10/2014 Time: 09:15am   Group Topic/Focus:  Recovery Goals:   The focus of this group is to identify appropriate goals for recovery and establish a plan to achieve them.  Participation Level:  Active  Participation Quality:  Appropriate and Attentive  Affect:  Flat  Cognitive:  Alert and Oriented  Insight: Improving  Engagement in Group:  Improving  Modes of Intervention:  Activity, Education, Orientation, Socialization and Support  Additional Comments:  Pt able to identify one goal to accomplish today. Pt engaged in group and showed insight into recovery.   Aurora Mask 08/10/2014, 10:16 AM

## 2014-08-10 NOTE — Plan of Care (Signed)
Problem: Ineffective individual coping Goal: STG: Patient will remain free from self harm Outcome: Progressing Pt remains safe with 15 minute checks     

## 2014-08-10 NOTE — [facility] Group Notes (Signed)
[facility] LCSW Group Therapy  08/10/2014 , 2:27 PM   Type of Therapy:  Group Therapy  Participation Level:  Active  Participation Quality:  Attentive  Affect:  Appropriate  Cognitive:  Alert  Insight:  Improving  Engagement in Therapy:  Engaged  Modes of Intervention:  Discussion, Exploration and Socialization  Summary of Progress/Problems: Today's group focused on the term Diagnosis.  Participants were asked to define the term, and then pronounce whether it is a negative, positive or neutral term.  Stayed the entire time, and was actively engaged throughout.  Talked about alcoholism diagnosis, took responsibility for his actions, and shared some his struggles over the years.  But also shared examples of his resilience as well.  Is glad that he ended up in the hospital, and is feeling optimistic about things going forward.  Rodney Hartman 08/10/2014 , 2:27 PM

## 2014-08-10 NOTE — Progress Notes (Signed)
Patient ID: Rodney Hartman, male   DOB: 1960-07-20, 54 y.o.   MRN: [id number] D: Visible on milieu with some peer interaction noted. Open and spontaneous in conversation. Appears depressed. Denies SI/HI/AVH and contracts for safety. Did complain of headache. States he feels like his mood did improve some throughout the day. Otherwise offered no questions or concerns.  A: Support and encouragement provided. Safety has been maintained with q15 minute observation. He is compliant with meds and treatment goals. He is attending groups. PRN provided for c/o headache.\\  R: Remain safe on the unit. Will f/u response to prn for pain. Will continue current POC and q15 minute obs for safety.

## 2014-08-10 NOTE — Progress Notes (Signed)
The focus of this group is to help patients review their daily goal of treatment and discuss progress on daily workbooks. Pt attended the evening group session and responded to all discussion prompts from the Writer. Pt shared that today was a difficult day on the unit, but that he was glad to have remained calm and patient with his peers on the unit. "Some people have really been getting on my nerves, but I've kept my cool." Pt was praised for this self-control and encouraged to speak with staff if another Pt begins to bother him. Rodney Hartman reported having no additional requests from Nursing Staff this evening. Pt's affect was appropriate.

## 2014-08-10 NOTE — Progress Notes (Signed)
Patient ID: Rodney Hartman, male   DOB: [birth year], 54 y.o.   MRN: [id number] [facility] MD Progress Note  08/10/2014 1:12 PM Subjective: Patient is a 54 year old male diagnosed with of major depressive disorder recurrent, severe without psychotic features and alcohol use disorder.  Patient reports that he still feels depressed but is no longer suicidal as he's not having any withdrawal symptoms. Patient adds that his alcohol use has worsened his depression, feels he needs to be in a residential rehabilitation as he is unable to stay clean by himself. He adds that he knows he needs to take responsibility for his actions but struggles with his alcohol use. He has that he is also homeless, wants to stay sober and work towards finding a job and having a place to live.  Patient denies any hallucinations, any but drove symptoms, is working on his coping skills and discharge goals. Patient states that he's okay with being discharged to a shelter if he does not get a bed at [facility]. Principal Problem: MDD (major depressive disorder), recurrent, severe, with psychosis Diagnosis:   Patient Active Problem List   Diagnosis Date Noted  . Alcohol abuse [F10.10] 08/07/2014  . MDD (major depressive disorder), recurrent, severe, with psychosis [F33.3] 08/07/2014   Total Time spent with patient: 15 minutes   Past Medical History:  Past Medical History  Diagnosis Date  . Hypertension     Past Surgical History  Procedure Laterality Date  . Tonsillectomy    . 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    Family History: History reviewed. No pertinent family history. Social History:  History  Alcohol Use  . 3.6 oz/week  . 6 Cans of beer per week    Comment: every other day      History  Drug Use No    History   Social History  . Marital Status: Legally Separated    Spouse Name: N/A  . Number of Children: N/A  .  Years of Education: N/A   Social History Main Topics  . Smoking status: Current Some Day Smoker -- 0.50 packs/day    Types: Cigarettes  . Smokeless tobacco: Not on file  . Alcohol Use: 3.6 oz/week    6 Cans of beer per week     Comment: every other day   . Drug Use: No  . Sexual Activity: Not on file   Other Topics Concern  . None   Social History Narrative   Additional History:    Sleep: Good  Appetite:  Good   Assessment:   Musculoskeletal: Strength & Muscle Tone: within normal limits Gait & Station: normal Patient leans: N/A   Psychiatric Specialty Exam: Physical Exam  Review of Systems  Constitutional: Positive for weight loss. Negative for fever and malaise/fatigue.  HENT: Negative.  Negative for congestion, nosebleeds and sore throat.   Eyes: Negative.  Negative for blurred vision, double vision, discharge and redness.  Respiratory: Negative.  Negative for cough and wheezing.   Cardiovascular: Negative.  Negative for chest pain, palpitations and PND.  Gastrointestinal: Negative.  Negative for heartburn, nausea, vomiting, abdominal pain and diarrhea.  Genitourinary: Negative.  Negative for dysuria.  Musculoskeletal: Negative.  Negative for myalgias and falls.  Skin: Negative.  Negative for rash.  Neurological: Negative.  Negative for dizziness, tingling, tremors, seizures, loss of consciousness, weakness and headaches.  Endo/Heme/Allergies: Negative.  Negative  for environmental allergies.  Psychiatric/Behavioral: Positive for depression and substance abuse. Negative for suicidal ideas, hallucinations and memory loss. The patient is not nervous/anxious and does not have insomnia.     Blood pressure 142/86, pulse 58, temperature 98.6 F (37 C), temperature source Oral, resp. rate 17, height 5\' 5"  (1.651 m), weight 78.019 kg (172 lb), SpO2 100 %.Body mass index is 28.62 kg/(m^2).  General Appearance: Casual and Fairly Groomed  Patent attorney::  Fair  Speech:  Normal  Rate  Volume:  Normal  Mood:  Depressed  Affect:  Congruent and Depressed  Thought Process:  Coherent and Goal Directed  Orientation:  Full (Time, Place, and Person)  Thought Content:  Rumination  Suicidal Thoughts:  No  Homicidal Thoughts:  No  Memory:  Immediate;   Fair Recent;   Fair Remote;   Fair  Judgement:  Intact  Insight:  Lacking  Psychomotor Activity:  Normal  Concentration:  Fair  Recall:  [employer] of Knowledge:Fair  Language: Fair  Akathisia:  Negative  Handed:  Right  AIMS (if indicated):     Assets:  Communication Skills Desire for Improvement  ADL's:  Intact  Cognition: WNL  Sleep:        Current Medications: Current Facility-Administered Medications  Medication Dose Route Frequency Provider Last Rate Last Dose  . acetaminophen (TYLENOL) tablet 650 mg  650 mg Oral Q6H PRN Beau Fanny, FNP   650 mg at 08/09/14 1713  . alum & mag hydroxide-simeth (MAALOX/MYLANTA) 200-200-20 MG/5ML suspension 30 mL  30 mL Oral Q4H PRN Beau Fanny, FNP      . benztropine (COGENTIN) tablet 0.5 mg  0.5 mg Oral QHS Cleotis Nipper, MD   0.5 mg at 08/09/14 2139  . feeding supplement (ENSURE ENLIVE) (ENSURE ENLIVE) liquid 237 mL  237 mL Oral BID BM Tilda Franco, RD   237 mL at 08/09/14 1531  . FLUoxetine (PROZAC) capsule 10 mg  10 mg Oral Daily Cleotis Nipper, MD   10 mg at 08/10/14 0804  . haloperidol (HALDOL) tablet 5 mg  5 mg Oral QHS Cleotis Nipper, MD   5 mg at 08/09/14 2139  . hydrOXYzine (ATARAX/VISTARIL) tablet 25 mg  25 mg Oral Q4H PRN Sanjuana Kava, NP   25 mg at 08/08/14 0759  . ibuprofen (ADVIL,MOTRIN) tablet 600 mg  600 mg Oral Q6H PRN Sanjuana Kava, NP   600 mg at 08/10/14 0805  . lidocaine (LIDODERM) 5 % 1 patch  1 patch Transdermal Q24H Sanjuana Kava, NP   1 patch at 08/09/14 1822  . lisinopril (PRINIVIL,ZESTRIL) tablet 10 mg  10 mg Oral Daily Sanjuana Kava, NP   10 mg at 08/10/14 0805  . magnesium hydroxide (MILK OF MAGNESIA) suspension 30 mL  30 mL Oral Daily  PRN Beau Fanny, FNP      . nicotine (NICODERM CQ - dosed in mg/24 hours) patch 21 mg  21 mg Transdermal Daily Sanjuana Kava, NP   21 mg at 08/10/14 0806  . traZODone (DESYREL) tablet 50 mg  50 mg Oral QHS,MR X 1 Cleotis Nipper, MD   50 mg at 08/09/14 2237    Lab Results: No results found for this or any previous visit (from the past 48 hour(s)).  Physical Findings: AIMS: Facial and Oral Movements Muscles of Facial Expression: None, normal Lips and Perioral Area: None, normal Jaw: None, normal Tongue: None, normal,Extremity Movements Upper (arms, wrists, hands, fingers): None, normal  Lower (legs, knees, ankles, toes): None, normal, Trunk Movements Neck, shoulders, hips: None, normal, Overall Severity Severity of abnormal movements (highest score from questions above): None, normal Incapacitation due to abnormal movements: None, normal Patient's awareness of abnormal movements (rate only patient's report): No Awareness, Dental Status Current problems with teeth and/or dentures?: Yes Does patient usually wear dentures?: No  CIWA:  CIWA-Ar Total: 3 COWS:     Treatment Plan Summary: Daily contact with patient to assess and evaluate symptoms and progress in treatment, Medication management and Plan continue cogentin, prozac, and haldol. pt requesting rehab upon discharge.  Patient states that he's anxious about being discharged to a shelter as he's afraid he is going to relapse. He adds that he's working on coping skills to help him stay sober if he is discharged to a shelter but prefers to go to rehabilitation so he can get his life back on track    [facility] 08/10/2014, 1:12 PM

## 2014-08-10 NOTE — Progress Notes (Signed)
Patient ID: Rodney Hartman, male   DOB: 03-05-1960, 54 y.o.   MRN: [id number]   Pt currently presents with a flat affect and cooperative behavior. Per self inventory, pt rates depression, hopelessness and anxiety at a 5. Pt's daily goal is "anxiety" and they intend to do so by "stay calm." Pt reports good sleep, a good appetite, normal energy and good concentration. Pt reports headache pain this morning. Pt is cooperative but forwards little to staff member.   Pt provided with medications per providers orders. Pt's labs and vitals were monitored throughout the day. Pt supported emotionally and encouraged to express concerns and questions. Pt educated on medications.  Pt's safety ensured with 15 minute and environmental checks. Pt currently denies SI/HI and A/V hallucinations. Pt verbally agrees to seek staff if SI/HI or A/VH occurs and to consult with staff before acting on these thoughts. Will continue POC.

## 2014-08-11 NOTE — [facility] Group Notes (Signed)
[facility] [employer] Group Therapy  08/11/2014 , 1:40 PM    Type of Therapy:  [employer] Presentation  Participation Level:  Active  Participation Quality:  Attentive  Affect:  Blunted  Cognitive:  Oriented  Insight:  Limited  Engagement in Therapy:  Engaged  Modes of Intervention:  Discussion, Education and Socialization  Summary of Progress/Problems:  Onalee Hua from [employer] came to present his recovery story and play the guitar.  Sat quietly and attentively listened attentively through out.  Stayed the entire time.  Thanked the speaker for coming.  Daryel Gerald B 08/11/2014 , 1:40 PM

## 2014-08-11 NOTE — Progress Notes (Signed)
Patient ID: Rodney Hartman, male   DOB: 02/01/1960, 54 y.o.   MRN: [id number] [facility] MD Progress Note  08/11/2014 11:57 AM Subjective: Patient is a 54 year old male diagnosed with of major depressive disorder recurrent, severe without psychotic features and alcohol use disorder.  Patient reports that he he felt overwhelmed last night, knows he's sabotaged his treatment. Patient states that he's afraid of being discharged, even to rehabilitation as he is afraid he will make poor choices.  Patient denies any hallucinations but did report it to staff last night, is working on his coping skills and discharge goals. Patient states that he's okay with being discharged to a shelter tomorrow Principal Problem: MDD (major depressive disorder), recurrent, severe, with psychosis Diagnosis:   Patient Active Problem List   Diagnosis Date Noted  . Alcohol abuse [F10.10] 08/07/2014  . MDD (major depressive disorder), recurrent, severe, with psychosis [F33.3] 08/07/2014   Total Time spent with patient: 15 minutes   Past Medical History:  Past Medical History  Diagnosis Date  . Hypertension     Past Surgical History  Procedure Laterality Date  . Tonsillectomy    . 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    Family History: History reviewed. No pertinent family history. Social History:  History  Alcohol Use  . 3.6 oz/week  . 6 Cans of beer per week    Comment: every other day      History  Drug Use No    History   Social History  . Marital Status: Legally Separated    Spouse Name: N/A  . Number of Children: N/A  . Years of Education: N/A   Social History Main Topics  . Smoking status: Current Some Day Smoker -- 0.50 packs/day    Types: Cigarettes  . Smokeless tobacco: Not on file  . Alcohol Use: 3.6 oz/week    6 Cans of beer per week     Comment: every other day   . Drug Use: No  . Sexual Activity: Not on  file   Other Topics Concern  . None   Social History Narrative   Additional History:    Sleep: Good  Appetite:  Good   Assessment:   Musculoskeletal: Strength & Muscle Tone: within normal limits Gait & Station: normal Patient leans: N/A   Psychiatric Specialty Exam: Physical Exam  Review of Systems  Constitutional: Positive for weight loss. Negative for fever and malaise/fatigue.  HENT: Negative.  Negative for congestion, nosebleeds and sore throat.   Eyes: Negative.  Negative for blurred vision, double vision, discharge and redness.  Respiratory: Negative.  Negative for cough and wheezing.   Cardiovascular: Negative.  Negative for chest pain, palpitations and PND.  Gastrointestinal: Negative.  Negative for heartburn, nausea, vomiting, abdominal pain and diarrhea.  Genitourinary: Negative.  Negative for dysuria.  Musculoskeletal: Negative.  Negative for myalgias and falls.  Skin: Negative.  Negative for rash.  Neurological: Negative.  Negative for dizziness, tingling, tremors, seizures, loss of consciousness, weakness and headaches.  Endo/Heme/Allergies: Negative.  Negative for environmental allergies.  Psychiatric/Behavioral: Positive for depression and substance abuse. Negative for suicidal ideas, hallucinations and memory loss. The patient is not nervous/anxious and does not have insomnia.     Blood pressure 146/95, pulse 70, temperature 97.6 F (36.4 C), temperature source Oral, resp. rate 18, height 5\' 5"  (1.651 m), weight 78.019 kg (172 lb), SpO2 100 %.Body mass  index is 28.62 kg/(m^2).  General Appearance: Casual and Fairly Groomed  Patent attorney::  Fair  Speech:  Normal Rate  Volume:  Normal  Mood:  Depressed  Affect:  Congruent and Depressed  Thought Process:  Coherent and Goal Directed  Orientation:  Full (Time, Place, and Person)  Thought Content:  Rumination  Suicidal Thoughts:  No  Homicidal Thoughts:  No  Memory:  Immediate;   Fair Recent;    Fair Remote;   Fair  Judgement:  Intact  Insight:  Lacking  Psychomotor Activity:  Normal  Concentration:  Fair  Recall:  [employer] of Knowledge:Fair  Language: Fair  Akathisia:  Negative  Handed:  Right  AIMS (if indicated):     Assets:  Communication Skills Desire for Improvement  ADL's:  Intact  Cognition: WNL  Sleep:  Number of Hours: 5.25     Current Medications: Current Facility-Administered Medications  Medication Dose Route Frequency Provider Last Rate Last Dose  . acetaminophen (TYLENOL) tablet 650 mg  650 mg Oral Q6H PRN Beau Fanny, FNP   650 mg at 08/09/14 1713  . alum & mag hydroxide-simeth (MAALOX/MYLANTA) 200-200-20 MG/5ML suspension 30 mL  30 mL Oral Q4H PRN Beau Fanny, FNP      . benztropine (COGENTIN) tablet 0.5 mg  0.5 mg Oral QHS Cleotis Nipper, MD   0.5 mg at 08/10/14 2133  . feeding supplement (ENSURE ENLIVE) (ENSURE ENLIVE) liquid 237 mL  237 mL Oral BID BM Tilda Franco, RD   237 mL at 08/09/14 1531  . FLUoxetine (PROZAC) capsule 10 mg  10 mg Oral Daily Cleotis Nipper, MD   10 mg at 08/11/14 0802  . haloperidol (HALDOL) tablet 5 mg  5 mg Oral QHS Cleotis Nipper, MD   5 mg at 08/10/14 2133  . hydrOXYzine (ATARAX/VISTARIL) tablet 25 mg  25 mg Oral Q4H PRN Sanjuana Kava, NP   25 mg at 08/08/14 0759  . ibuprofen (ADVIL,MOTRIN) tablet 600 mg  600 mg Oral Q6H PRN Sanjuana Kava, NP   600 mg at 08/10/14 2135  . lidocaine (LIDODERM) 5 % 1 patch  1 patch Transdermal Q24H Sanjuana Kava, NP   1 patch at 08/10/14 1831  . lisinopril (PRINIVIL,ZESTRIL) tablet 10 mg  10 mg Oral Daily Sanjuana Kava, NP   10 mg at 08/11/14 0802  . magnesium hydroxide (MILK OF MAGNESIA) suspension 30 mL  30 mL Oral Daily PRN Beau Fanny, FNP      . nicotine (NICODERM CQ - dosed in mg/24 hours) patch 21 mg  21 mg Transdermal Daily Sanjuana Kava, NP   21 mg at 08/11/14 0640  . traZODone (DESYREL) tablet 50 mg  50 mg Oral QHS,MR X 1 Cleotis Nipper, MD   50 mg at 08/10/14 2238    Lab  Results: No results found for this or any previous visit (from the past 48 hour(s)).  Physical Findings: AIMS: Facial and Oral Movements Muscles of Facial Expression: None, normal Lips and Perioral Area: None, normal Jaw: None, normal Tongue: None, normal,Extremity Movements Upper (arms, wrists, hands, fingers): None, normal Lower (legs, knees, ankles, toes): None, normal, Trunk Movements Neck, shoulders, hips: None, normal, Overall Severity Severity of abnormal movements (highest score from questions above): None, normal Incapacitation due to abnormal movements: None, normal Patient's awareness of abnormal movements (rate only patient's report): No Awareness, Dental Status Current problems with teeth and/or dentures?: No Does patient usually wear dentures?: No  CIWA:  CIWA-Ar Total: 3 COWS:     Treatment Plan Summary: Daily contact with patient to assess and evaluate symptoms and progress in treatment, Medication management and Plan continue cogentin, prozac, and haldol.  Patient states that he knows he sabotaged his treatment, reports that he was anxious about going to a drug rehabilitation program, felt overwhelmed with the thought of leaving the hospital as he feels safe here. Discussed in length with patient the need for him to move forward, patient willing to contract for safety and is willing for discharge tomorrow. Patient to work on his discharge: In learning how to cope with situations outpatient and the need for him to participate actively in outpatient treatment    Demari Kropp 08/11/2014, 11:57 AM

## 2014-08-11 NOTE — [facility] Group Notes (Signed)
[facility] LCSW Aftercare Discharge Planning Group Note   08/11/2014 1:37 PM  Participation Quality:  Minimal  Mood/Affect:  Depressed  Depression Rating:  10  Anxiety Rating:  10  Thoughts of Suicide:  No Will you contract for safety?   NA  Current AVH:  Yes  Plan for Discharge/Comments:  Pt had just gotten off the phone with [facility].  Told me they asked him about hearing voices, and he had confirmed that he is currently.  He is now suspecting that he will not get in as they told him they needed to talk to me.  I confirmed that they are not set up with a psychiatrist to work with people who are experiencing psychosis, but also suggested that maybe he could get in tomorrow.  He was despondent and left.  Transportation Means:   Supports:  Daryel Gerald B

## 2014-08-11 NOTE — Progress Notes (Signed)
Patient ID: Rodney Hartman, male   DOB: 27-May-1960, 54 y.o.   MRN: [id number]   Pt currently presents with a flat affect and guarded behavior. Pt mood is anxious, per Child psychotherapist, pt was assigned a bed at [facility]. The pt called [facility] and told them that he was actively psychotic. Plan is that pt will call [facility] tomorrow and if negative for auditory hallucinations, pt will be admitted.  Per self inventory, pt rates depression at a 5, hopelessness 4 and anxiety 6. Pt's daily goal is to "agitation" and they intend to do so by "work on agitation." Pt reports poor sleep, a good appetite, normal energy and good concentration.   Pt provided with medications per providers orders. Pt's labs and vitals were monitored throughout the day. Pt supported emotionally and encouraged to express concerns and questions. Pt educated on medications.  Pt's safety ensured with 15 minute and environmental checks. Pt currently denies SI/HI and A/V hallucinations. Pt verbally agrees to seek staff if SI/HI or A/VH occurs and to consult with staff before acting on these thoughts. Pt remains isolative, stays in bed throughout the afternoon. Pt does attend groups. Will continue POC.

## 2014-08-11 NOTE — Plan of Care (Signed)
Problem: Ineffective individual coping Goal: STG: Patient will remain free from self harm Outcome: Progressing Pt remains free from harm today. Pt denies SI.

## 2014-08-12 MED ORDER — LIDOCAINE 5 % EX PTCH: 1.0000 | MEDICATED_PATCH | CUTANEOUS | Status: DC

## 2014-08-12 MED ORDER — TRAZODONE HCL 50 MG PO TABS: 50.0000 mg | ORAL_TABLET | Freq: Every evening | ORAL | Status: DC | PRN

## 2014-08-12 MED ORDER — HYDROXYZINE HCL 25 MG PO TABS: 25.0000 mg | ORAL_TABLET | Freq: Three times a day (TID) | ORAL | Status: DC | PRN

## 2014-08-12 MED ORDER — HALOPERIDOL 5 MG PO TABS: 5.0000 mg | ORAL_TABLET | Freq: Every day | ORAL | Status: DC

## 2014-08-12 MED ORDER — LISINOPRIL 10 MG PO TABS: 10.0000 mg | ORAL_TABLET | Freq: Every day | ORAL | Status: DC

## 2014-08-12 MED ORDER — FLUOXETINE HCL 10 MG PO CAPS: 10.0000 mg | ORAL_CAPSULE | Freq: Every day | ORAL | Status: DC

## 2014-08-12 MED ORDER — HYDROXYZINE HCL 25 MG PO TABS
25.0000 mg | ORAL_TABLET | Freq: Three times a day (TID) | ORAL | Status: DC | PRN
  Administered 2014-08-12: 25 mg via ORAL
  Filled 2014-08-12 (×3): qty 21
  Filled 2014-08-12: qty 1

## 2014-08-12 MED ORDER — TRAZODONE HCL 50 MG PO TABS
50.0000 mg | ORAL_TABLET | Freq: Every evening | ORAL | Status: DC | PRN
  Filled 2014-08-12 (×2): qty 14

## 2014-08-12 MED ORDER — BENZTROPINE MESYLATE 1 MG PO TABS: 1.0000 mg | ORAL_TABLET | Freq: Every day | ORAL | Status: DC

## 2014-08-12 MED ORDER — NICOTINE 21 MG/24HR TD PT24: 21.0000 mg | MEDICATED_PATCH | Freq: Every day | TRANSDERMAL | Status: DC

## 2014-08-12 NOTE — Tx Team (Signed)
Date: 08/12/2014 10:38 AM Progress in Treatment:  Attending groups: Yes  Participating in groups: Yes Taking medication as prescribed: Yes  Tolerating medication: Yes  Family/Significant othe contact made: No  Patient understands diagnosis: Yes  AEB asking for help with detox and getting into rehab Discussing patient identified problems/goals with staff:  Medical problems stabilized or resolved: Active signs of withdrawal Denies suicidal/homicidal ideation: Yes Patient has not harmed self or Others: Yes  New problem(s) identified: Discharge Plan or Barriers:  See below Additional comments: Reason for Continuation of Hospitalization:    Estimated length of stay: D/C today Review of initial/current patient goals per problem list:  1. Goal(s): Patient will participate in aftercare plan  Met: Yes  Target date:  As evidenced by: Patient will participate within aftercare plan AEB aftercare provider and housing plan at discharge being identified.  08/09/14  Pt asking for referral to [facility] and [facility]-signed releases for both 08/12/2014 Rodney Hartman will return to stay with his sister, and follow up with [facility].  In the meantime, he will contact [facility] and [facility] about beds 2. Goal (s): Patient will exhibit decreased depressive symptoms and suicidal ideations.  Met: Yes  Target date:  As evidenced by: Patient will utilize self rating of depression at 3 or below and demonstrate decreased signs of depression or be deemed stable for discharge by MD. 08/09/14 Pt rates his depression a 5 today. 08/12/2014 Rodney Hartman rates his depression at a 3 today 4. Goal(s): Patient will demonstrate decreased signs of withdrawal due to substance abuse  Met: Yes  Target date:  As evidenced by: Patient will produce a CIWA/COWS score of 0, have stable vitals signs, and no symptoms of withdrawal 08/12/2014  5. Goal(s): Patient will demonstrate decreased signs of psychosis   Met: Yes  Target date:  As  evidenced by: Patient will demonstrate decreased frequency of AVH or return to baseline function 08/09/14: Pt is c/o of AH today. 08/12/2014 [name] denies psychosis today     Patient:    Family:    Physician: Hampton Abbot, MD  08/12/2014 10:38 AM  Nursing:  Ashby Dawes, RN  08/12/2014 10:38 AM  Clinical Social Worker [place], [place] 08/12/2014 10:38 AM  Other: Jake Bathe Liasion 08/12/2014 10:38 AM  Clinical: RN 08/12/2014 10:38 AM  Other: , RN Charge Nurse 08/12/2014 10:38 AM  Other:       Eduard Clos, MSW, LCSWA

## 2014-08-12 NOTE — Progress Notes (Cosign Needed)
Pt d/c to start [facility] Monday. D/c instructions, rx's, and supply of meds given and reviewed with pt. Pt verbalizes understanding. Pt given bus pass and directions per CM. Rodney Hartman says his son "is picking me up and taking me to his house in [place]".

## 2014-08-12 NOTE — Progress Notes (Signed)
D: Pt is at this time alert and oriented x 4. Pt complained of mild anxiety and increased worrying about leaving for [facility] on Thursday. Pt didn't understand that it was his call about being actively psychotic to [facility] that prevented him from leaving Wednesday; he states, "I was told I would be going to [facility] today; I guess I was deceived" Pt also complained of mild pain, however' denies SI/HI/AVH.  A: Pt attended wrap-up group. The nurse explained to patient the reason he was unable to leave on Wednesday. Pt shaved in readiness for his d/c on Thursday. Medications administered as prescribed.  Support, encouragement, and safe environment provided.  15-minute safety checks continue. R: Pt was med compliant.  Pt was very accepting. Safety checks continue.

## 2014-08-12 NOTE — BHH Suicide Risk Assessment (Signed)
[facility] INPATIENT:  Family/Significant Other Suicide Prevention Education  Suicide Prevention Education:  Patient Refusal for Family/Significant Other Suicide Prevention Education: The patient Rodney Hartman has refused to provide written consent for family/significant other to be provided Family/Significant Other Suicide Prevention Education during admission and/or prior to discharge.  Physician notified.  Daryel Gerald B 08/12/2014, 10:42 AM

## 2014-08-12 NOTE — Progress Notes (Signed)
  [facility] Adult Case Management Discharge Plan :  Will you be returning to the same living situation after discharge:  Yes,  back to sister's At discharge, do you have transportation home?: Yes,  bus pass Do you have the ability to pay for your medications: Yes,  mental health  Release of information consent forms completed and in the chart;  Patient's signature needed at discharge.  Patient to Follow up at: Follow-up Information    Follow up with [facility].   Why:  Go within the next 5 business days between 8 and 11 for your hospital follow up appointment.  It is important you go so that you have enough medication to take with you when you go to [facility] at [phone]] or [facility] [phone]   Contact information:   [address]  [place]  [phone]      Patient denies SI/HI: Yes,  yes    Safety Planning and Suicide Prevention discussed: Yes,  yes  Have you used any form of tobacco in the last 30 days? (Cigarettes, Smokeless Tobacco, Cigars, and/or Pipes): Yes  Has patient been referred to the Quitline?: Patient refused referral  Ida Rogue 08/12/2014, 10:37 AM
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
## 2014-08-12 NOTE — [facility] Suicide Risk Assessment (Signed)
[facility] Discharge Suicide Risk Assessment Patient is a 54 year old male diagnosed with of major depressive disorder recurrent, severe without psychotic features and alcohol use disorder.  Patient states that he is doing better, would prefer to go to art, are RTS on discharge but if he does not get of bed, he is willing to go home and stay with his sister. He adds that his mood is improved, he wants to get help for substance abuse and also plans to keep his outpatient follow-up.  On a scale of 0-10, with 0 being no symptoms and 10 being the worst, patient reports his depression is a 6 out of 10. He adds that pain is a precipitating factor in regards to his depression and reports that his kids and his sister are relieving factors. He adds that he wants to get better and has no thoughts of hurting himself or anyone else. He also denies any symptoms of psychosis, any symptoms of mania, any side effects with his medications.  Demographic Factors:  Male, Low socioeconomic status and Unemployed  Total Time spent with patient: 30 minutes  Musculoskeletal: Strength & Muscle Tone: within normal limits Gait & Station: normal Patient leans: N/A  Psychiatric Specialty Exam: Physical Exam  Review of Systems  Constitutional: Negative.  Negative for fever and malaise/fatigue.  HENT: Negative.  Negative for congestion and sore throat.   Eyes: Negative.  Negative for blurred vision, double vision, discharge and redness.  Respiratory: Negative.  Negative for cough, shortness of breath and wheezing.   Cardiovascular: Negative.  Negative for chest pain and palpitations.  Gastrointestinal: Negative.  Negative for heartburn, nausea, vomiting, abdominal pain, diarrhea and constipation.  Genitourinary: Negative.  Negative for dysuria.  Musculoskeletal: Positive for back pain and joint pain. Negative for myalgias, falls and neck pain.  Neurological: Negative.  Negative for dizziness, focal weakness, seizures, loss of  consciousness, weakness and headaches.  Endo/Heme/Allergies: Negative.  Negative for environmental allergies.  Psychiatric/Behavioral: Positive for depression. Negative for suicidal ideas, hallucinations, memory loss and substance abuse. The patient is not nervous/anxious and does not have insomnia.     Blood pressure 132/88, pulse 69, temperature 97.7 F (36.5 C), temperature source Oral, resp. rate 20, height 5\' 5"  (1.651 m), weight 78.019 kg (172 lb), SpO2 100 %.Body mass index is 28.62 kg/(m^2).  General Appearance: Casual  Eye Contact::  Fair  Speech:  Clear and Coherent and Normal Rate409  Volume:  Normal  Mood:  Euthymic  Affect:  Congruent and Full Range  Thought Process:  Coherent, Goal Directed and Intact  Orientation:  Full (Time, Place, and Person)  Thought Content:  WDL  Suicidal Thoughts:  No  Homicidal Thoughts:  No  Memory:  Immediate;   Fair Recent;   Fair Remote;   Fair  Judgement:  Fair  Insight:  Shallow  Psychomotor Activity:  Normal  Concentration:  Fair  Recall:  [employer] of Knowledge:Fair  Language: Fair  Akathisia:  No  Handed:  Right  AIMS (if indicated):     Assets:  Desire for Improvement Physical Health  Sleep:  Number of Hours: 5  Cognition: WNL  ADL's:  Intact   Have you used any form of tobacco in the last 30 days? (Cigarettes, Smokeless Tobacco, Cigars, and/or Pipes): Yes  Has this patient used any form of tobacco in the last 30 days? (Cigarettes, Smokeless Tobacco, Cigars, and/or Pipes) Yes, Prescription not provided because: as patient refused  Mental Status Per Nursing Assessment::   On Admission:  Current Mental Status by Physician: NA  Loss Factors: Financial problems/change in socioeconomic status  Historical Factors: NA  Risk Reduction Factors:   Religious beliefs about death, Living with another person, especially a relative and Positive social support  Continued Clinical Symptoms:  Depression:   Comorbid alcohol  abuse/dependence Alcohol/Substance Abuse/Dependencies More than one psychiatric diagnosis  Cognitive Features That Contribute To Risk:  None    Suicide Risk:  Minimal: No identifiable suicidal ideation.  Patients presenting with no risk factors but with morbid ruminations; may be classified as minimal risk based on the severity of the depressive symptoms  Principal Problem: MDD (major depressive disorder), recurrent, severe, with psychosis Discharge Diagnoses:  Patient Active Problem List   Diagnosis Date Noted  . Alcohol abuse [F10.10] 08/07/2014  . MDD (major depressive disorder), recurrent, severe, with psychosis [F33.3] 08/07/2014    Follow-up Information    Follow up with [facility].      Follow up with [facility].      Plan Of Care/Follow-up recommendations:  Activity:  as tolerated Diet:  Low-sodium diet Other:  To keep appointments, attend AA  Is patient on multiple antipsychotic therapies at discharge:  No   Has Patient had three or more failed trials of antipsychotic monotherapy by history:  No  Recommended Plan for Multiple Antipsychotic Therapies: NA    Kire Ferg 08/12/2014, 9:48 AM

## 2014-11-26 ENCOUNTER — Emergency Department (HOSPITAL_COMMUNITY): Payer: No Typology Code available for payment source

## 2014-11-26 ENCOUNTER — Encounter (HOSPITAL_COMMUNITY): Payer: Self-pay | Admitting: *Deleted

## 2014-11-26 ENCOUNTER — Inpatient Hospital Stay (HOSPITAL_COMMUNITY)
Admission: EM | Admit: 2014-11-26 | Discharge: 2014-11-29 | DRG: 088 | Disposition: A | Discharge status: Home / Self Care | Payer: No Typology Code available for payment source | Attending: General Surgery | Admitting: General Surgery

## 2014-11-26 DIAGNOSIS — S060X9A Concussion with loss of consciousness of unspecified duration, initial encounter: Principal | ICD-10-CM | POA: Diagnosis present

## 2014-11-26 DIAGNOSIS — K5900 Constipation, unspecified: Secondary | ICD-10-CM | POA: Diagnosis present

## 2014-11-26 DIAGNOSIS — I10 Essential (primary) hypertension: Secondary | ICD-10-CM | POA: Diagnosis present

## 2014-11-26 DIAGNOSIS — S36892A Contusion of other intra-abdominal organs, initial encounter: Secondary | ICD-10-CM

## 2014-11-26 DIAGNOSIS — F17200 Nicotine dependence, unspecified, uncomplicated: Secondary | ICD-10-CM | POA: Diagnosis present

## 2014-11-26 DIAGNOSIS — S300XXA Contusion of lower back and pelvis, initial encounter: Secondary | ICD-10-CM | POA: Diagnosis present

## 2014-11-26 DIAGNOSIS — D62 Acute posthemorrhagic anemia: Secondary | ICD-10-CM | POA: Diagnosis present

## 2014-11-26 DIAGNOSIS — K661 Hemoperitoneum: Secondary | ICD-10-CM | POA: Diagnosis present

## 2014-11-26 DIAGNOSIS — S0083XA Contusion of other part of head, initial encounter: Secondary | ICD-10-CM | POA: Diagnosis present

## 2014-11-26 LAB — PREPARE FRESH FROZEN PLASMA
UNIT DIVISION: 0
Unit division: 0

## 2014-11-26 LAB — URINALYSIS, ROUTINE W REFLEX MICROSCOPIC
BILIRUBIN URINE: NEGATIVE
Glucose, UA: NEGATIVE mg/dL
KETONES UR: NEGATIVE mg/dL
Leukocytes, UA: NEGATIVE
NITRITE: NEGATIVE
PH: 5.5 (ref 5.0–8.0)
Protein, ur: NEGATIVE mg/dL
SPECIFIC GRAVITY, URINE: 1.013 (ref 1.005–1.030)

## 2014-11-26 LAB — COMPREHENSIVE METABOLIC PANEL
ALK PHOS: 75 U/L (ref 38–126)
ALT: 28 U/L (ref 17–63)
AST: 57 U/L — AB (ref 15–41)
Albumin: 4 g/dL (ref 3.5–5.0)
Anion gap: 10 (ref 5–15)
BUN: 8 mg/dL (ref 6–20)
CALCIUM: 9.3 mg/dL (ref 8.9–10.3)
CHLORIDE: 101 mmol/L (ref 101–111)
CO2: 25 mmol/L (ref 22–32)
CREATININE: 0.95 mg/dL (ref 0.61–1.24)
GFR calc non Af Amer: >60 mL/min (ref >60)
Glucose, Bld: 95 mg/dL (ref 65–99)
Potassium: 4 mmol/L (ref 3.5–5.1)
SODIUM: 136 mmol/L (ref 135–145)
Total Bilirubin: 0.5 mg/dL (ref 0.3–1.2)
Total Protein: 7.9 g/dL (ref 6.5–8.1)

## 2014-11-26 LAB — TYPE AND SCREEN
ABO/RH(D): A POS
Antibody Screen: NEGATIVE
UNIT DIVISION: 0
Unit division: 0

## 2014-11-26 LAB — I-STAT CHEM 8, ED
BUN: 8 mg/dL (ref 6–20)
CALCIUM ION: 1.13 mmol/L (ref 1.12–1.23)
CHLORIDE: 98 mmol/L — AB (ref 101–111)
Creatinine, Ser: 1.5 mg/dL — ABNORMAL HIGH (ref 0.61–1.24)
GLUCOSE: 101 mg/dL — AB (ref 65–99)
HCT: 45 % (ref 39.0–52.0)
Hemoglobin: 15.3 g/dL (ref 13.0–17.0)
Potassium: 3.8 mmol/L (ref 3.5–5.1)
SODIUM: 136 mmol/L (ref 135–145)
TCO2: 25 mmol/L (ref 0–100)

## 2014-11-26 LAB — ETHANOL: ALCOHOL ETHYL (B): 359 mg/dL — AB (ref <5)

## 2014-11-26 LAB — CBC
HCT: 39.1 % (ref 39.0–52.0)
Hemoglobin: 13.2 g/dL (ref 13.0–17.0)
MCH: 30.8 pg (ref 26.0–34.0)
MCHC: 33.8 g/dL (ref 30.0–36.0)
MCV: 91.1 fL (ref 78.0–100.0)
PLATELETS: 267 10*3/uL (ref 150–400)
RBC: 4.29 MIL/uL (ref 4.22–5.81)
RDW: 15.1 % (ref 11.5–15.5)
WBC: 6.4 10*3/uL (ref 4.0–10.5)

## 2014-11-26 LAB — ABO/RH: ABO/RH(D): A POS

## 2014-11-26 LAB — URINE MICROSCOPIC-ADD ON

## 2014-11-26 LAB — I-STAT CG4 LACTIC ACID, ED: LACTIC ACID, VENOUS: 2.02 mmol/L — AB (ref 0.5–2.0)

## 2014-11-26 LAB — PROTIME-INR
INR: 1.05 (ref 0.00–1.49)
Prothrombin Time: 13.9 seconds (ref 11.6–15.2)

## 2014-11-26 MED ORDER — ONDANSETRON HCL 4 MG PO TABS: 4.0000 mg | ORAL_TABLET | Freq: Four times a day (QID) | ORAL | Status: DC | PRN

## 2014-11-26 MED ORDER — MORPHINE SULFATE (PF) 4 MG/ML IV SOLN
4.0000 mg | Freq: Once | INTRAVENOUS | Status: AC
  Administered 2014-11-26: 4 mg via INTRAVENOUS
  Filled 2014-11-26: qty 1

## 2014-11-26 MED ORDER — ACETAMINOPHEN 325 MG PO TABS
650.0000 mg | ORAL_TABLET | ORAL | Status: DC | PRN
  Administered 2014-11-28: 650 mg via ORAL
  Filled 2014-11-26: qty 2

## 2014-11-26 MED ORDER — HYDROCHLOROTHIAZIDE 25 MG PO TABS
25.0000 mg | ORAL_TABLET | Freq: Every day | ORAL | Status: DC
  Administered 2014-11-27 – 2014-11-29 (×3): 25 mg via ORAL
  Filled 2014-11-26 (×5): qty 1

## 2014-11-26 MED ORDER — OXYCODONE HCL 5 MG PO TABS: 5.0000 mg | ORAL_TABLET | ORAL | Status: DC | PRN

## 2014-11-26 MED ORDER — FENTANYL CITRATE (PF) 100 MCG/2ML IJ SOLN
50.0000 ug | Freq: Once | INTRAMUSCULAR | Status: AC
  Administered 2014-11-26: 50 ug via INTRAVENOUS

## 2014-11-26 MED ORDER — TETANUS-DIPHTH-ACELL PERTUSSIS 5-2.5-18.5 LF-MCG/0.5 IM SUSP
0.5000 mL | Freq: Once | INTRAMUSCULAR | Status: AC
  Administered 2014-11-26: 0.5 mL via INTRAMUSCULAR
  Filled 2014-11-26: qty 0.5

## 2014-11-26 MED ORDER — PANTOPRAZOLE SODIUM 40 MG IV SOLR
40.0000 mg | Freq: Every day | INTRAVENOUS | Status: DC
  Filled 2014-11-26 (×2): qty 40

## 2014-11-26 MED ORDER — SODIUM CHLORIDE 0.9 % IV SOLN
INTRAVENOUS | Status: DC
  Administered 2014-11-26 – 2014-11-28 (×2): via INTRAVENOUS

## 2014-11-26 MED ORDER — ONDANSETRON HCL 4 MG/2ML IJ SOLN
4.0000 mg | Freq: Four times a day (QID) | INTRAMUSCULAR | Status: DC | PRN
  Administered 2014-11-27: 4 mg via INTRAVENOUS
  Filled 2014-11-26: qty 2

## 2014-11-26 MED ORDER — OXYCODONE HCL 5 MG PO TABS
10.0000 mg | ORAL_TABLET | ORAL | Status: DC | PRN
  Administered 2014-11-27 – 2014-11-29 (×11): 10 mg via ORAL
  Filled 2014-11-26 (×11): qty 2

## 2014-11-26 MED ORDER — PNEUMOCOCCAL VAC POLYVALENT 25 MCG/0.5ML IJ INJ
0.5000 mL | INJECTION | INTRAMUSCULAR | Status: DC
  Filled 2014-11-26: qty 0.5

## 2014-11-26 MED ORDER — HYDROMORPHONE HCL 1 MG/ML IJ SOLN
1.0000 mg | INTRAMUSCULAR | Status: DC | PRN
  Administered 2014-11-26 – 2014-11-29 (×4): 1 mg via INTRAVENOUS
  Filled 2014-11-26 (×4): qty 1

## 2014-11-26 MED ORDER — PANTOPRAZOLE SODIUM 40 MG PO TBEC
40.0000 mg | DELAYED_RELEASE_TABLET | Freq: Every day | ORAL | Status: DC
  Administered 2014-11-27 – 2014-11-29 (×3): 40 mg via ORAL
  Filled 2014-11-26 (×3): qty 1

## 2014-11-26 MED ORDER — IOHEXOL 300 MG/ML  SOLN
100.0000 mL | Freq: Once | INTRAMUSCULAR | Status: AC | PRN
  Administered 2014-11-26: 100 mL via INTRAVENOUS

## 2014-11-26 MED ORDER — FENTANYL CITRATE (PF) 100 MCG/2ML IJ SOLN
INTRAMUSCULAR | Status: AC
  Filled 2014-11-26: qty 2

## 2014-11-26 NOTE — ED Notes (Signed)
Pt returned from CT. Pt in no apparent distress  

## 2014-11-26 NOTE — ED Notes (Signed)
Mrs Charlton HawsMcConnell (neice) [phone] called with BP med.  Losartin/HCTZ 50-12.5mg  daily

## 2014-11-26 NOTE — ED Notes (Signed)
Placed 4X4 over pt head lac. Pt complaining of pain, requesting pain meds. Nurse notified.
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
## 2014-11-26 NOTE — ED Notes (Signed)
Pt transported to CT by RN.
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
## 2014-11-26 NOTE — ED Notes (Signed)
Patient laying on his left side with family at the bedside.  CSI here to photograph
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
## 2014-11-27 ENCOUNTER — Inpatient Hospital Stay (HOSPITAL_COMMUNITY): Payer: No Typology Code available for payment source

## 2014-11-27 LAB — CBC
HEMATOCRIT: 31.4 % — AB (ref 39.0–52.0)
HEMATOCRIT: 28.3 % — AB (ref 39.0–52.0)
HEMOGLOBIN: 10.5 g/dL — AB (ref 13.0–17.0)
Hemoglobin: 9.2 g/dL — ABNORMAL LOW (ref 13.0–17.0)
MCH: 30.7 pg (ref 26.0–34.0)
MCH: 29.8 pg (ref 26.0–34.0)
MCHC: 33.4 g/dL (ref 30.0–36.0)
MCHC: 32.5 g/dL (ref 30.0–36.0)
MCV: 91.8 fL (ref 78.0–100.0)
MCV: 91.6 fL (ref 78.0–100.0)
Platelets: 240 10*3/uL (ref 150–400)
Platelets: 219 10*3/uL (ref 150–400)
RBC: 3.42 MIL/uL — ABNORMAL LOW (ref 4.22–5.81)
RBC: 3.09 MIL/uL — AB (ref 4.22–5.81)
RDW: 15.6 % — ABNORMAL HIGH (ref 11.5–15.5)
RDW: 15.3 % (ref 11.5–15.5)
WBC: 9.3 10*3/uL (ref 4.0–10.5)
WBC: 7.8 10*3/uL (ref 4.0–10.5)

## 2014-11-27 LAB — BASIC METABOLIC PANEL
Anion gap: 12 (ref 5–15)
BUN: 5 mg/dL — ABNORMAL LOW (ref 6–20)
CHLORIDE: 104 mmol/L (ref 101–111)
CO2: 21 mmol/L — AB (ref 22–32)
CREATININE: 0.92 mg/dL (ref 0.61–1.24)
Calcium: 8.3 mg/dL — ABNORMAL LOW (ref 8.9–10.3)
GFR calc non Af Amer: >60 mL/min (ref >60)
GLUCOSE: 98 mg/dL (ref 65–99)
Potassium: 3.8 mmol/L (ref 3.5–5.1)
Sodium: 137 mmol/L (ref 135–145)

## 2014-11-27 LAB — MRSA PCR SCREENING: MRSA by PCR: NEGATIVE

## 2014-11-27 LAB — CDS SEROLOGY

## 2014-11-27 MED ORDER — PNEUMOCOCCAL VAC POLYVALENT 25 MCG/0.5ML IJ INJ: 0.5000 mL | INJECTION | INTRAMUSCULAR | Status: DC | PRN

## 2014-11-27 MED ORDER — METHOCARBAMOL 500 MG PO TABS: 1000.0000 mg | ORAL_TABLET | Freq: Three times a day (TID) | ORAL | Status: DC | PRN
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
## 2014-11-28 ENCOUNTER — Inpatient Hospital Stay (HOSPITAL_COMMUNITY): Payer: No Typology Code available for payment source

## 2014-11-28 LAB — CBC
HEMATOCRIT: 26.9 % — AB (ref 39.0–52.0)
Hemoglobin: 8.8 g/dL — ABNORMAL LOW (ref 13.0–17.0)
MCH: 29.8 pg (ref 26.0–34.0)
MCHC: 32.7 g/dL (ref 30.0–36.0)
MCV: 91.2 fL (ref 78.0–100.0)
PLATELETS: 215 10*3/uL (ref 150–400)
RBC: 2.95 MIL/uL — AB (ref 4.22–5.81)
RDW: 14.9 % (ref 11.5–15.5)
WBC: 7.1 10*3/uL (ref 4.0–10.5)

## 2014-11-28 MED ORDER — POLYETHYLENE GLYCOL 3350 17 G PO PACK
17.0000 g | PACK | Freq: Two times a day (BID) | ORAL | Status: DC | PRN
  Administered 2014-11-29: 17 g via ORAL
  Filled 2014-11-28: qty 1

## 2014-11-28 MED ORDER — DOCUSATE SODIUM 100 MG PO CAPS
100.0000 mg | ORAL_CAPSULE | Freq: Two times a day (BID) | ORAL | Status: DC
Start: 2014-11-28 — End: 2014-11-29
  Administered 2014-11-28 – 2014-11-29 (×3): 100 mg via ORAL
  Filled 2014-11-28 (×3): qty 1

## 2014-11-28 MED ORDER — BISACODYL 10 MG RE SUPP
10.0000 mg | Freq: Every day | RECTAL | Status: DC
Start: 2014-11-28 — End: 2014-11-29
  Filled 2014-11-28 (×2): qty 1
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
## 2014-11-28 NOTE — Clinical Social Work Note (Signed)
Clinical Social Work Assessment  Patient Details  Name: Rodney Hartman MRN: [id number] Date of Birth: [birth year]  Date of referral:  11/28/14               Reason for consult:  Housing Concerns/Homelessness                Permission sought to share information with:  Other (none) Permission granted to share information::  No  Name::        Agency::     Relationship::     Contact Information:     Housing/Transportation Living arrangements for the past 2 months:   (off-and-on with sister) Source of Information:  Patient Patient Interpreter Needed:  None Criminal Activity/Legal Involvement Pertinent to Current Situation/Hospitalization:  No - Comment as needed Significant Relationships:  Siblings Lives with:  Siblings Do you feel safe going back to the place where you live?  Yes Need for family participation in patient care:  No (Coment)  Care giving concerns:  n/a   Office managerocial Worker assessment / plan:  Pt stated that he has been living with his sister sporadically for a period of time prior to this admission.  Pt stated that he will return to his sister's home upon d/c.  Pt stated that this is a safe place for him and that he is fine with being there.  SW provided Pt with the homeless shelter list and [employer], complete with free meal listings in [place].  Employment status:  Unemployed Health and safety inspectornsurance information:  Other (Comment Required) (no ins) PT Recommendations:  Not assessed at this time Information / Referral to community resources:  Shelter, Other (Comment Required) ([employer] (list of meal offerings in [place]  Patient/Family's Response to care:  Pt's was accepting of his need to go back to his sister's.  Patient/Family's Understanding of and Emotional Response to Diagnosis, Current Treatment, and Prognosis:  Pt voiced an understanding of his situation and was interested in reviewing the materials that SW provided.  Emotional Assessment Appearance:   Appears stated age Attitude/Demeanor/Rapport:  Guarded Affect (typically observed):  Accepting, Flat Orientation:  Oriented to Self, Oriented to Place, Oriented to  Time, Oriented to Situation Alcohol / Substance use:  Not Applicable Psych involvement (Current and /or in the community):  No (Comment)  Discharge Needs  Concerns to be addressed:  Denies Needs/Concerns at this time Readmission within the last 30 days:  No Current discharge risk:  None Barriers to Discharge:  No Barriers Identified   Lebron QuamSimpson, Gladine Plude Amanda, LCSW 11/28/2014, 11:44 AM

## 2014-11-28 NOTE — Progress Notes (Signed)
Pt transferred to 5N21 per MD order, report called to receiving nurse.All questions answered.
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
## 2014-11-29 LAB — CBC
HEMATOCRIT: 26.5 % — AB (ref 39.0–52.0)
HEMOGLOBIN: 9 g/dL — AB (ref 13.0–17.0)
MCH: 31.1 pg (ref 26.0–34.0)
MCHC: 34 g/dL (ref 30.0–36.0)
MCV: 91.7 fL (ref 78.0–100.0)
Platelets: 228 10*3/uL (ref 150–400)
RBC: 2.89 MIL/uL — ABNORMAL LOW (ref 4.22–5.81)
RDW: 14.5 % (ref 11.5–15.5)
WBC: 5.8 10*3/uL (ref 4.0–10.5)

## 2014-11-29 MED ORDER — OXYCODONE HCL 10 MG PO TABS: 5.0000 mg | ORAL_TABLET | ORAL | Status: DC | PRN
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
## 2014-11-29 NOTE — Discharge Instructions (Signed)
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
## 2014-11-29 NOTE — Progress Notes (Signed)
Charge nurse and Janee Mornhompson, MD (Trauma) aware of patient's premature discharge. MD advised that prescription be left at front desk with secretary in case patient returns.

## 2014-11-29 NOTE — Progress Notes (Signed)
Patient last seen in bed around 1045 by student nurse. Approximately 1215 RN was gathering items for a dressing change and before RN entered patient's room she was informed by student nurse that patient could not be found. RN walked the halls to be sure that patient was not on the floor. Price tags for new clothing is left on patient's bedside table. Gown left in bed. Patient is homeless.

## 2014-11-30 ENCOUNTER — Encounter (HOSPITAL_COMMUNITY): Payer: Self-pay | Admitting: Emergency Medicine

## 2014-11-30 ENCOUNTER — Emergency Department (HOSPITAL_COMMUNITY)
Admission: EM | Admit: 2014-11-30 | Discharge: 2014-11-30 | Disposition: A | Discharge status: Home / Self Care | Payer: Self-pay | Attending: Emergency Medicine | Admitting: Emergency Medicine

## 2014-11-30 ENCOUNTER — Encounter: Payer: Self-pay | Admitting: Surgery

## 2014-11-30 DIAGNOSIS — I10 Essential (primary) hypertension: Secondary | ICD-10-CM | POA: Insufficient documentation

## 2014-11-30 DIAGNOSIS — X58XXXA Exposure to other specified factors, initial encounter: Secondary | ICD-10-CM | POA: Insufficient documentation

## 2014-11-30 DIAGNOSIS — Y9289 Other specified places as the place of occurrence of the external cause: Secondary | ICD-10-CM | POA: Insufficient documentation

## 2014-11-30 DIAGNOSIS — Y998 Other external cause status: Secondary | ICD-10-CM | POA: Insufficient documentation

## 2014-11-30 DIAGNOSIS — T162XXA Foreign body in left ear, initial encounter: Secondary | ICD-10-CM | POA: Insufficient documentation

## 2014-11-30 DIAGNOSIS — Z79899 Other long term (current) drug therapy: Secondary | ICD-10-CM | POA: Insufficient documentation

## 2014-11-30 DIAGNOSIS — Y9389 Activity, other specified: Secondary | ICD-10-CM | POA: Insufficient documentation

## 2014-11-30 DIAGNOSIS — F17210 Nicotine dependence, cigarettes, uncomplicated: Secondary | ICD-10-CM | POA: Insufficient documentation

## 2014-11-30 MED ORDER — CIPROFLOXACIN-DEXAMETHASONE 0.3-0.1 % OT SUSP: 4.0000 [drp] | Freq: Two times a day (BID) | OTIC | Status: AC

## 2014-11-30 MED ORDER — OXYCODONE-ACETAMINOPHEN 10-325 MG PO TABS: 0.5000 | ORAL_TABLET | Freq: Four times a day (QID) | ORAL | Status: DC | PRN
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
## 2014-11-30 NOTE — ED Notes (Signed)
Pt stable, ambulatory, states understanding of discharge instructions 
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
## 2014-11-30 NOTE — Discharge Instructions (Signed)
Ear Foreign Body °An ear foreign body is an object that is stuck in your ear. The object is usually stuck in the ear canal. °CAUSES °In all ages of people, the most common foreign bodies are insects that enter the ear canal. It is common for young children to put objects into the ear canal. These may include pebbles, beads, parts of toys, and any other small objects that fit into the ear. In adults, objects such as cotton swabs may become lodged in the ear canal.  °SIGNS AND SYMPTOMS °A foreign body in the ear may cause: °· Pain. °· Buzzing or roaring sounds. °· Hearing loss. °· Ear drainage or bleeding. °· Nausea and vomiting. °· A feeling that your ear is full. °DIAGNOSIS °Your health care provider may be able to diagnose an ear foreign body based on the information that you provide, your symptoms, and a physical exam. Your health care provider may also perform tests, such as testing your hearing and your ear pressure, to check for infection or other problems that are caused by the foreign body in your ear. °TREATMENT °Treatment depends on what the foreign body is, the location of the foreign body in your ear, and whether or not the foreign body has injured any part of your inner ear. If the foreign body is visible to your health care provider, it may be possible to remove the foreign body using: °· A tool, such as medical tweezers (forceps) or a suction tube (catheter). °· Irrigation. This uses water to flush the foreign body out of your ear. This is used only if the foreign body is not likely to swell or enlarge when it is put in water. °If the foreign body is not visible or your health care provider was not able to remove the foreign body, you may be referred to a specialist for removal. You may also be prescribed antibiotic medicine or ear drops to prevent infection. If the foreign body has caused injury to other parts of your ear, you may need additional treatment. °HOME CARE INSTRUCTIONS °· Keep all  follow-up visits as directed by your health care provider. This is important. °· Take medicines only as directed by your health care provider. °· If you were prescribed an antibiotic medicine, finish it all even if you start to feel better. °PREVENTION °· Keep small objects out of reach of young children. Tell children not to put anything in their ears. °· Do not put anything in your ear, including cotton swabs, to clean your ears. Talk to your health care provider about how to clean your ears safely. °SEEK MEDICAL CARE IF: °· You have a headache. °· Your have blood coming from your ear. °· You have a fever. °· You have increased pain or swelling of your ear. °· Your hearing is reduced. °· You have discharge coming from your ear. °  °This information is not intended to replace advice given to you by your health care provider. Make sure you discuss any questions you have with your health care provider. °  °Document Released: 12/23/1999 Document Revised: 01/15/2014 Document Reviewed: 08/10/2013 °Elsevier Interactive Patient Education ©2016 [employer] ° °

## 2014-11-30 NOTE — ED Notes (Signed)
WAITING FOR CORRECTED MRN.

## 2014-12-01 ENCOUNTER — Encounter (HOSPITAL_COMMUNITY): Payer: Self-pay | Admitting: Emergency Medicine

## 2014-12-17 ENCOUNTER — Inpatient Hospital Stay (HOSPITAL_COMMUNITY)
Admission: EM | Admit: 2014-12-17 | Discharge: 2014-12-22 | DRG: 885 | Disposition: A | Discharge status: Home / Self Care | Payer: Self-pay | Attending: Internal Medicine | Admitting: Internal Medicine

## 2014-12-17 ENCOUNTER — Encounter (HOSPITAL_COMMUNITY): Payer: Self-pay

## 2014-12-17 DIAGNOSIS — Y908 Blood alcohol level of 240 mg/100 ml or more: Secondary | ICD-10-CM | POA: Diagnosis present

## 2014-12-17 DIAGNOSIS — G4700 Insomnia, unspecified: Secondary | ICD-10-CM | POA: Diagnosis present

## 2014-12-17 DIAGNOSIS — F10931 Alcohol use, unspecified with withdrawal delirium: Secondary | ICD-10-CM | POA: Diagnosis present

## 2014-12-17 DIAGNOSIS — R45851 Suicidal ideations: Secondary | ICD-10-CM

## 2014-12-17 DIAGNOSIS — R45850 Homicidal ideations: Secondary | ICD-10-CM

## 2014-12-17 DIAGNOSIS — F29 Unspecified psychosis not due to a substance or known physiological condition: Secondary | ICD-10-CM | POA: Diagnosis present

## 2014-12-17 DIAGNOSIS — F419 Anxiety disorder, unspecified: Secondary | ICD-10-CM | POA: Diagnosis present

## 2014-12-17 DIAGNOSIS — F332 Major depressive disorder, recurrent severe without psychotic features: Principal | ICD-10-CM | POA: Diagnosis present

## 2014-12-17 DIAGNOSIS — F17210 Nicotine dependence, cigarettes, uncomplicated: Secondary | ICD-10-CM | POA: Diagnosis present

## 2014-12-17 DIAGNOSIS — F10229 Alcohol dependence with intoxication, unspecified: Secondary | ICD-10-CM | POA: Diagnosis present

## 2014-12-17 DIAGNOSIS — F333 Major depressive disorder, recurrent, severe with psychotic symptoms: Secondary | ICD-10-CM | POA: Diagnosis present

## 2014-12-17 DIAGNOSIS — F10231 Alcohol dependence with withdrawal delirium: Secondary | ICD-10-CM | POA: Diagnosis present

## 2014-12-17 DIAGNOSIS — I10 Essential (primary) hypertension: Secondary | ICD-10-CM | POA: Diagnosis present

## 2014-12-17 DIAGNOSIS — Z79899 Other long term (current) drug therapy: Secondary | ICD-10-CM

## 2014-12-17 DIAGNOSIS — F1020 Alcohol dependence, uncomplicated: Secondary | ICD-10-CM | POA: Diagnosis present

## 2014-12-17 LAB — COMPREHENSIVE METABOLIC PANEL
ALBUMIN: 4.2 g/dL (ref 3.5–5.0)
ALT: 22 U/L (ref 17–63)
ANION GAP: 9 (ref 5–15)
AST: 36 U/L (ref 15–41)
Alkaline Phosphatase: 101 U/L (ref 38–126)
CHLORIDE: 105 mmol/L (ref 101–111)
CO2: 28 mmol/L (ref 22–32)
Calcium: 9.3 mg/dL (ref 8.9–10.3)
Creatinine, Ser: 0.81 mg/dL (ref 0.61–1.24)
GFR calc Af Amer: >60 mL/min (ref >60)
Glucose, Bld: 104 mg/dL — ABNORMAL HIGH (ref 65–99)
POTASSIUM: 4.3 mmol/L (ref 3.5–5.1)
Sodium: 142 mmol/L (ref 135–145)
Total Bilirubin: 0.6 mg/dL (ref 0.3–1.2)
Total Protein: 8.5 g/dL — ABNORMAL HIGH (ref 6.5–8.1)

## 2014-12-17 LAB — SALICYLATE LEVEL: Salicylate Lvl: <4 mg/dL (ref 2.8–30.0)

## 2014-12-17 LAB — RAPID URINE DRUG SCREEN, HOSP PERFORMED
AMPHETAMINES: NOT DETECTED
BENZODIAZEPINES: NOT DETECTED
Barbiturates: NOT DETECTED
COCAINE: NOT DETECTED
OPIATES: NOT DETECTED
Tetrahydrocannabinol: NOT DETECTED

## 2014-12-17 LAB — ACETAMINOPHEN LEVEL

## 2014-12-17 LAB — CBC
HCT: 38.4 % — ABNORMAL LOW (ref 39.0–52.0)
Hemoglobin: 12.9 g/dL — ABNORMAL LOW (ref 13.0–17.0)
MCH: 31.3 pg (ref 26.0–34.0)
MCHC: 33.6 g/dL (ref 30.0–36.0)
MCV: 93.2 fL (ref 78.0–100.0)
PLATELETS: 315 10*3/uL (ref 150–400)
RBC: 4.12 MIL/uL — AB (ref 4.22–5.81)
RDW: 16.8 % — AB (ref 11.5–15.5)
WBC: 6.1 10*3/uL (ref 4.0–10.5)

## 2014-12-17 LAB — ETHANOL
ALCOHOL ETHYL (B): 429 mg/dL — AB (ref <5)
ALCOHOL ETHYL (B): 257 mg/dL — AB (ref <5)

## 2014-12-17 MED ORDER — NICOTINE 7 MG/24HR TD PT24
7.0000 mg | MEDICATED_PATCH | Freq: Once | TRANSDERMAL | Status: AC
  Administered 2014-12-17: 7 mg via TRANSDERMAL
  Filled 2014-12-17: qty 1

## 2014-12-17 MED ORDER — IBUPROFEN 200 MG PO TABS
600.0000 mg | ORAL_TABLET | Freq: Three times a day (TID) | ORAL | Status: DC | PRN
  Administered 2014-12-17 – 2014-12-22 (×9): 600 mg via ORAL
  Filled 2014-12-17 (×10): qty 3

## 2014-12-17 MED ORDER — ZOLPIDEM TARTRATE 5 MG PO TABS: 5.0000 mg | ORAL_TABLET | Freq: Every evening | ORAL | Status: DC | PRN

## 2014-12-17 MED ORDER — BENZTROPINE MESYLATE 1 MG PO TABS: 1.0000 mg | ORAL_TABLET | Freq: Every day | ORAL | Status: DC

## 2014-12-17 MED ORDER — ONDANSETRON HCL 4 MG PO TABS: 4.0000 mg | ORAL_TABLET | Freq: Three times a day (TID) | ORAL | Status: DC | PRN

## 2014-12-17 MED ORDER — HYDROXYZINE HCL 25 MG PO TABS
25.0000 mg | ORAL_TABLET | Freq: Three times a day (TID) | ORAL | Status: DC | PRN
  Administered 2014-12-19: 25 mg via ORAL

## 2014-12-17 MED ORDER — PANTOPRAZOLE SODIUM 20 MG PO TBEC
20.0000 mg | DELAYED_RELEASE_TABLET | Freq: Every day | ORAL | Status: DC
  Administered 2014-12-17 – 2014-12-22 (×6): 20 mg via ORAL
  Filled 2014-12-17 (×6): qty 1

## 2014-12-17 MED ORDER — LORAZEPAM 1 MG PO TABS
1.0000 mg | ORAL_TABLET | Freq: Three times a day (TID) | ORAL | Status: DC | PRN
  Filled 2014-12-17: qty 1

## 2014-12-17 MED ORDER — LORAZEPAM 1 MG PO TABS
1.0000 mg | ORAL_TABLET | Freq: Three times a day (TID) | ORAL | Status: DC | PRN
Start: 2014-12-17 — End: 2014-12-20
  Administered 2014-12-20: 1 mg via ORAL
  Filled 2014-12-17: qty 1

## 2014-12-17 MED ORDER — HYDROCHLOROTHIAZIDE 25 MG PO TABS
25.0000 mg | ORAL_TABLET | Freq: Every day | ORAL | Status: DC
  Administered 2014-12-17 – 2014-12-20 (×4): 25 mg via ORAL
  Filled 2014-12-17 (×4): qty 1

## 2014-12-17 MED ORDER — HYDRALAZINE HCL 20 MG/ML IJ SOLN
5.0000 mg | Freq: Once | INTRAMUSCULAR | Status: AC
  Administered 2014-12-17: 5 mg via INTRAMUSCULAR
  Filled 2014-12-17: qty 0.25

## 2014-12-17 MED ORDER — FLUOXETINE HCL 10 MG PO CAPS
10.0000 mg | ORAL_CAPSULE | Freq: Every day | ORAL | Status: DC
  Administered 2014-12-17 – 2014-12-21 (×5): 10 mg via ORAL
  Filled 2014-12-17 (×5): qty 1

## 2014-12-17 MED ORDER — ALUM & MAG HYDROXIDE-SIMETH 200-200-20 MG/5ML PO SUSP: 30.0000 mL | ORAL | Status: DC | PRN

## 2014-12-17 MED ORDER — ACETAMINOPHEN 325 MG PO TABS
650.0000 mg | ORAL_TABLET | ORAL | Status: DC | PRN
  Administered 2014-12-17 – 2014-12-20 (×3): 650 mg via ORAL
  Filled 2014-12-17 (×3): qty 2

## 2014-12-17 MED ORDER — LISINOPRIL 10 MG PO TABS
10.0000 mg | ORAL_TABLET | Freq: Every day | ORAL | Status: DC
  Administered 2014-12-17 – 2014-12-20 (×4): 10 mg via ORAL
  Filled 2014-12-17 (×4): qty 1

## 2014-12-17 MED ORDER — HALOPERIDOL 5 MG PO TABS
5.0000 mg | ORAL_TABLET | Freq: Every day | ORAL | Status: DC
  Administered 2014-12-17 – 2014-12-19 (×4): 5 mg via ORAL
  Filled 2014-12-17 (×4): qty 1

## 2014-12-17 MED ORDER — TRAZODONE HCL 50 MG PO TABS: 50.0000 mg | ORAL_TABLET | Freq: Every evening | ORAL | Status: DC | PRN

## 2014-12-17 MED ORDER — BENZTROPINE MESYLATE 0.5 MG PO TABS
0.5000 mg | ORAL_TABLET | Freq: Every day | ORAL | Status: DC
  Administered 2014-12-17 – 2014-12-21 (×4): 0.5 mg via ORAL
  Filled 2014-12-17 (×5): qty 1

## 2014-12-17 NOTE — ED Notes (Signed)
Pt. Noted in room. No complaints or concerns voiced. No distress or abnormal behavior noted. Will continue to monitor with security cameras. Q 15 minute rounds continue. 

## 2014-12-17 NOTE — ED Notes (Signed)
[facility]. Pt. Alert and oriented in no distress denies SI, HI, and AVH.  Pt. Instructed to come to me with problems or concerns.Will continue to monitor for safety via security cameras and Q 15 minute checks.
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
## 2014-12-17 NOTE — ED Notes (Signed)
Pt brought in by [employer], pt is voluntary, pt walks up to the registration window and states to registration that he's going to kill her and and then kill himself. Security and Off duty [employer] called at this time. Pt is HI and SI

## 2014-12-17 NOTE — ED Notes (Signed)
Pt. Noted sleeping in room. No complaints or concerns voiced. No distress or abnormal behavior noted. Will continue to monitor with security cameras. Q 15 minute rounds continue. 
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
## 2014-12-17 NOTE — ED Notes (Signed)
Belongings moved to locker 38 

## 2014-12-17 NOTE — ED Notes (Signed)
[facility] Expected date:  Expected time:  Means of arrival:  Comments: Hold for room 33
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
## 2014-12-17 NOTE — BH Assessment (Signed)
Assessment completed. Consulted Hulan FessIjeoma Nwaeze, NP who reccommended inpatient treatment .TTS to seek placement.
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
## 2014-12-17 NOTE — ED Notes (Signed)
TTS in to evaluate pt.  

## 2014-12-18 MED ORDER — LOPERAMIDE HCL 2 MG PO CAPS: 2.0000 mg | ORAL_CAPSULE | ORAL | Status: DC | PRN

## 2014-12-18 MED ORDER — VITAMIN B-1 100 MG PO TABS
100.0000 mg | ORAL_TABLET | Freq: Every day | ORAL | Status: DC
  Administered 2014-12-19 – 2014-12-20 (×2): 100 mg via ORAL
  Filled 2014-12-18 (×2): qty 1

## 2014-12-18 MED ORDER — CHLORDIAZEPOXIDE HCL 25 MG PO CAPS: 25.0000 mg | ORAL_CAPSULE | Freq: Four times a day (QID) | ORAL | Status: DC | PRN

## 2014-12-18 MED ORDER — CHLORDIAZEPOXIDE HCL 25 MG PO CAPS: 25.0000 mg | ORAL_CAPSULE | Freq: Every day | ORAL | Status: DC

## 2014-12-18 MED ORDER — ONDANSETRON 4 MG PO TBDP: 4.0000 mg | ORAL_TABLET | Freq: Four times a day (QID) | ORAL | Status: DC | PRN

## 2014-12-18 MED ORDER — CHLORDIAZEPOXIDE HCL 25 MG PO CAPS
25.0000 mg | ORAL_CAPSULE | Freq: Three times a day (TID) | ORAL | Status: AC
  Administered 2014-12-19 (×3): 25 mg via ORAL
  Filled 2014-12-18 (×3): qty 1

## 2014-12-18 MED ORDER — THIAMINE HCL 100 MG/ML IJ SOLN
100.0000 mg | Freq: Once | INTRAMUSCULAR | Status: AC
  Administered 2014-12-18: 100 mg via INTRAMUSCULAR
  Filled 2014-12-18: qty 2

## 2014-12-18 MED ORDER — CHLORDIAZEPOXIDE HCL 25 MG PO CAPS
25.0000 mg | ORAL_CAPSULE | ORAL | Status: DC
  Administered 2014-12-20: 25 mg via ORAL
  Filled 2014-12-18: qty 1

## 2014-12-18 MED ORDER — HYDROXYZINE HCL 25 MG PO TABS
25.0000 mg | ORAL_TABLET | Freq: Four times a day (QID) | ORAL | Status: DC | PRN
  Filled 2014-12-18: qty 1

## 2014-12-18 MED ORDER — CHLORDIAZEPOXIDE HCL 25 MG PO CAPS
25.0000 mg | ORAL_CAPSULE | Freq: Four times a day (QID) | ORAL | Status: AC
  Administered 2014-12-18 (×4): 25 mg via ORAL
  Filled 2014-12-18 (×4): qty 1

## 2014-12-18 MED ORDER — ADULT MULTIVITAMIN W/MINERALS CH
1.0000 | ORAL_TABLET | Freq: Every day | ORAL | Status: DC
  Administered 2014-12-18 – 2014-12-20 (×3): 1 via ORAL
  Filled 2014-12-18 (×3): qty 1

## 2014-12-18 NOTE — ED Notes (Signed)
Pt. Noted sleeping in room. No complaints or concerns voiced. No distress or abnormal behavior noted. Will continue to monitor with security cameras. Q 15 minute rounds continue. 

## 2014-12-18 NOTE — ED Notes (Signed)
Report received from Lizbeth BarkJanie Rambo RN. Pt. Alert and oriented in no distress denies SI, HI, VH and pain. Pt. States he hears voices with various commands. Pt. Instructed to come to me with problems or concerns.Will continue to monitor for safety via security cameras and Q 15 minute checks.

## 2014-12-18 NOTE — ED Notes (Signed)
On the phone 

## 2014-12-18 NOTE — ED Notes (Signed)
Dr J and Josephine NP into see 

## 2014-12-18 NOTE — ED Notes (Signed)
Pt. Noted in room. No complaints or concerns voiced. No distress or abnormal behavior noted. Will continue to monitor with security cameras. Q 15 minute rounds continue. 

## 2014-12-18 NOTE — Progress Notes (Signed)
Disposition CSW completed patient referrals to the following inpatient Psych facilities:  [facility]  CSW will continue to follow patient for placement needs.  Seward SpeckLeo Mardel Grudzien [facility]W,LCAS Behavioral Health Disposition CSW [phone]

## 2014-12-18 NOTE — ED Notes (Signed)
In the shower, clean scrubs given

## 2014-12-18 NOTE — Consult Note (Signed)
Psychiatric Specialty Exam: Physical Exam  ROS  Blood pressure 142/98, pulse 84, temperature 98.5 F (36.9 C), temperature source Oral, resp. rate 20, height 5\' 11"  (1.803 m), weight 85.73 kg (189 lb), SpO2 100 %.Body mass index is 26.37 kg/(m^2).  General Appearance: Casual and Fairly Groomed  Patent attorneyye Contact::  Good  Speech:  Clear and Coherent and Normal Rate  Volume:  Normal  Mood:  Anxious and Depressed  Affect:  Appropriate, Congruent and Depressed  Thought Process:  Circumstantial  Orientation:  Full (Time, Place, and Person)  Thought Content:  WDL  Suicidal Thoughts:  Yes.  without intent/plan  Homicidal Thoughts:  Yes.  without intent/plan  Memory:  Immediate;   Fair Recent;   Fair Remote;   Fair  Judgement:  Fair  Insight:  Fair  Psychomotor Activity:  Normal  Concentration:  Fair  Recall:  [employer]  Fund of Knowledge:Fair  Language: Fair  Akathisia:  No  Handed:    AIMS (if indicated):     Assets:  Communication Skills Desire for Improvement Resilience Social Support  ADL's:  Intact  Cognition: WNL     Patient was seen in his room waiting for an inpatient Psychiatric admission bed.  He reports feeling depressed due to his sister passing few days ago.  He reports that he was going to drink himself to death yesterday due to feeling hopeless, helpless and angry.  Patient denies SI/HI/AVH today.  We are using Librium per our Alcohol detox protocol.  We will continue to seek inpatient hospitalization.  Alcohol use disorder, severe, dependence (HCC)   Plan:  Admit and seek placement.  Rodney Hartman  PMHNP-BC   Reviewed the information documented and agree with the treatment plan.  [name],JANARDHAHA R. 12/20/2014 9:05 AM

## 2014-12-19 MED ORDER — NICOTINE 21 MG/24HR TD PT24
21.0000 mg | MEDICATED_PATCH | Freq: Every day | TRANSDERMAL | Status: DC
  Administered 2014-12-19: 21 mg via TRANSDERMAL
  Filled 2014-12-19 (×2): qty 1

## 2014-12-19 NOTE — ED Notes (Signed)
Pt. Noted sleeping in room. No complaints or concerns voiced. No distress or abnormal behavior noted. Will continue to monitor with security cameras. Q 15 minute rounds continue. 

## 2014-12-19 NOTE — ED Notes (Signed)
Up to the bathroom 

## 2014-12-19 NOTE — ED Notes (Signed)
C/O moderate anxiety r/t etoh withdrawal. POC discussed with patient.  Support and encouragement offered.  No physical complaints.

## 2014-12-19 NOTE — ED Notes (Signed)
Pt. Noted in hall. No complaints or concerns voiced. No distress or abnormal behavior noted. Will continue to monitor with security cameras. Q 15 minute rounds continue. 

## 2014-12-19 NOTE — ED Notes (Signed)
Report received from LuAnn RN. Pt. Alert and oriented in no distress denies SI, HI, AVH and pain.  Pt. Instructed to come to me with problems or concerns.Will continue to monitor for safety via security cameras and Q 15 minute checks.  
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
## 2014-12-19 NOTE — ED Notes (Signed)
Pt. Noted in room. No complaints or concerns voiced. No distress or abnormal behavior noted. Will continue to monitor with security cameras. Q 15 minute rounds continue. 

## 2014-12-20 ENCOUNTER — Inpatient Hospital Stay (HOSPITAL_COMMUNITY)
Admission: AD | Admit: 2014-12-20 | Payer: Federal, State, Local not specified - Other | Source: Intra-hospital | Admitting: Psychiatry

## 2014-12-20 DIAGNOSIS — F10931 Alcohol use, unspecified with withdrawal delirium: Secondary | ICD-10-CM | POA: Diagnosis present

## 2014-12-20 DIAGNOSIS — F10231 Alcohol dependence with withdrawal delirium: Secondary | ICD-10-CM

## 2014-12-20 DIAGNOSIS — F333 Major depressive disorder, recurrent, severe with psychotic symptoms: Secondary | ICD-10-CM

## 2014-12-20 LAB — URINALYSIS W MICROSCOPIC (NOT AT ARMC)
BILIRUBIN URINE: NEGATIVE
Bacteria, UA: NONE SEEN
GLUCOSE, UA: NEGATIVE mg/dL
HGB URINE DIPSTICK: NEGATIVE
Ketones, ur: NEGATIVE mg/dL
Leukocytes, UA: NEGATIVE
NITRITE: NEGATIVE
PH: 6 (ref 5.0–8.0)
Protein, ur: NEGATIVE mg/dL
RBC / HPF: NONE SEEN RBC/hpf (ref 0–5)
SPECIFIC GRAVITY, URINE: 1.009 (ref 1.005–1.030)
WBC, UA: NONE SEEN WBC/hpf (ref 0–5)

## 2014-12-20 LAB — CBC
HEMATOCRIT: 35.4 % — AB (ref 39.0–52.0)
HEMOGLOBIN: 11.6 g/dL — AB (ref 13.0–17.0)
MCH: 30.9 pg (ref 26.0–34.0)
MCHC: 32.8 g/dL (ref 30.0–36.0)
MCV: 94.4 fL (ref 78.0–100.0)
Platelets: 204 10*3/uL (ref 150–400)
RBC: 3.75 MIL/uL — AB (ref 4.22–5.81)
RDW: 16 % — ABNORMAL HIGH (ref 11.5–15.5)
WBC: 5 10*3/uL (ref 4.0–10.5)

## 2014-12-20 LAB — PROTIME-INR
INR: 1.02 (ref 0.00–1.49)
PROTHROMBIN TIME: 13.6 s (ref 11.6–15.2)

## 2014-12-20 LAB — COMPREHENSIVE METABOLIC PANEL
ALT: 20 U/L (ref 17–63)
AST: 33 U/L (ref 15–41)
Albumin: 3.9 g/dL (ref 3.5–5.0)
Alkaline Phosphatase: 70 U/L (ref 38–126)
Anion gap: 7 (ref 5–15)
BUN: 15 mg/dL (ref 6–20)
CHLORIDE: 100 mmol/L — AB (ref 101–111)
CO2: 27 mmol/L (ref 22–32)
CREATININE: 0.84 mg/dL (ref 0.61–1.24)
Calcium: 9.4 mg/dL (ref 8.9–10.3)
GFR calc Af Amer: >60 mL/min (ref >60)
Glucose, Bld: 93 mg/dL (ref 65–99)
POTASSIUM: 4.1 mmol/L (ref 3.5–5.1)
SODIUM: 134 mmol/L — AB (ref 135–145)
Total Bilirubin: 0.6 mg/dL (ref 0.3–1.2)
Total Protein: 7.3 g/dL (ref 6.5–8.1)

## 2014-12-20 LAB — MAGNESIUM: MAGNESIUM: 1.9 mg/dL (ref 1.7–2.4)

## 2014-12-20 LAB — TSH: TSH: 0.446 u[IU]/mL (ref 0.350–4.500)

## 2014-12-20 LAB — AMMONIA: AMMONIA: 19 umol/L (ref 9–35)

## 2014-12-20 LAB — PHOSPHORUS: PHOSPHORUS: 3.7 mg/dL (ref 2.5–4.6)

## 2014-12-20 MED ORDER — LORAZEPAM 1 MG PO TABS: 1.0000 mg | ORAL_TABLET | Freq: Three times a day (TID) | ORAL | Status: DC

## 2014-12-20 MED ORDER — ONDANSETRON HCL 4 MG PO TABS: 4.0000 mg | ORAL_TABLET | Freq: Four times a day (QID) | ORAL | Status: DC | PRN

## 2014-12-20 MED ORDER — ADULT MULTIVITAMIN W/MINERALS CH: 1.0000 | ORAL_TABLET | Freq: Every day | ORAL | Status: DC

## 2014-12-20 MED ORDER — THIAMINE HCL 100 MG/ML IJ SOLN
Freq: Every day | INTRAVENOUS | Status: DC
  Filled 2014-12-20 (×2): qty 1000

## 2014-12-20 MED ORDER — HEPARIN SODIUM (PORCINE) 5000 UNIT/ML IJ SOLN
5000.0000 [IU] | Freq: Three times a day (TID) | INTRAMUSCULAR | Status: DC
Start: 2014-12-20 — End: 2014-12-22
  Administered 2014-12-21 – 2014-12-22 (×5): 5000 [IU] via SUBCUTANEOUS
  Filled 2014-12-20 (×7): qty 1

## 2014-12-20 MED ORDER — LOPERAMIDE HCL 2 MG PO CAPS: 2.0000 mg | ORAL_CAPSULE | ORAL | Status: DC | PRN

## 2014-12-20 MED ORDER — ACETAMINOPHEN 650 MG RE SUPP: 650.0000 mg | Freq: Four times a day (QID) | RECTAL | Status: DC | PRN

## 2014-12-20 MED ORDER — THIAMINE HCL 100 MG/ML IJ SOLN
100.0000 mg | Freq: Once | INTRAMUSCULAR | Status: AC
  Administered 2014-12-20: 100 mg via INTRAMUSCULAR
  Filled 2014-12-20: qty 2

## 2014-12-20 MED ORDER — ONDANSETRON HCL 4 MG/2ML IJ SOLN: 4.0000 mg | Freq: Four times a day (QID) | INTRAMUSCULAR | Status: DC | PRN

## 2014-12-20 MED ORDER — HYDROXYZINE HCL 25 MG PO TABS: 25.0000 mg | ORAL_TABLET | Freq: Four times a day (QID) | ORAL | Status: DC | PRN

## 2014-12-20 MED ORDER — VITAMIN B-1 100 MG PO TABS: 100.0000 mg | ORAL_TABLET | Freq: Every day | ORAL | Status: DC

## 2014-12-20 MED ORDER — LORAZEPAM 2 MG/ML IJ SOLN
2.0000 mg | Freq: Once | INTRAMUSCULAR | Status: AC
  Administered 2014-12-20: 2 mg via INTRAVENOUS
  Filled 2014-12-20: qty 1

## 2014-12-20 MED ORDER — LORAZEPAM 1 MG PO TABS: 1.0000 mg | ORAL_TABLET | Freq: Two times a day (BID) | ORAL | Status: DC

## 2014-12-20 MED ORDER — LORAZEPAM 2 MG/ML IJ SOLN
4.0000 mg | Freq: Once | INTRAMUSCULAR | Status: AC
  Administered 2014-12-20: 4 mg via INTRAVENOUS
  Filled 2014-12-20: qty 2

## 2014-12-20 MED ORDER — ACETAMINOPHEN 325 MG PO TABS
650.0000 mg | ORAL_TABLET | Freq: Four times a day (QID) | ORAL | Status: DC | PRN
  Administered 2014-12-22: 650 mg via ORAL
  Filled 2014-12-20: qty 2

## 2014-12-20 MED ORDER — LORAZEPAM 1 MG PO TABS
1.0000 mg | ORAL_TABLET | Freq: Four times a day (QID) | ORAL | Status: DC
  Filled 2014-12-20: qty 1

## 2014-12-20 MED ORDER — LORAZEPAM 1 MG PO TABS: 1.0000 mg | ORAL_TABLET | Freq: Four times a day (QID) | ORAL | Status: DC | PRN

## 2014-12-20 MED ORDER — LORAZEPAM 2 MG/ML IJ SOLN
3.0000 mg | Freq: Once | INTRAMUSCULAR | Status: AC
  Administered 2014-12-20: 3 mg via INTRAVENOUS
  Filled 2014-12-20: qty 2

## 2014-12-20 MED ORDER — LORAZEPAM 1 MG PO TABS: 1.0000 mg | ORAL_TABLET | Freq: Every day | ORAL | Status: DC

## 2014-12-20 MED ORDER — NICOTINE 21 MG/24HR TD PT24
21.0000 mg | MEDICATED_PATCH | Freq: Every day | TRANSDERMAL | Status: DC
  Administered 2014-12-21 – 2014-12-22 (×2): 21 mg via TRANSDERMAL
  Filled 2014-12-20 (×2): qty 1

## 2014-12-20 MED ORDER — SODIUM CHLORIDE 0.9 % IJ SOLN
3.0000 mL | Freq: Two times a day (BID) | INTRAMUSCULAR | Status: DC
  Administered 2014-12-20 – 2014-12-22 (×4): 3 mL via INTRAVENOUS

## 2014-12-20 MED ORDER — THIAMINE HCL 100 MG/ML IJ SOLN
Freq: Once | INTRAVENOUS | Status: AC
  Administered 2014-12-20: 16:00:00 via INTRAVENOUS
  Filled 2014-12-20: qty 1000

## 2014-12-20 MED ORDER — LORAZEPAM 2 MG/ML IJ SOLN: 2.0000 mg | INTRAMUSCULAR | Status: DC | PRN

## 2014-12-20 MED ORDER — LORAZEPAM 2 MG/ML IJ SOLN
2.0000 mg | Freq: Once | INTRAMUSCULAR | Status: AC
  Administered 2014-12-20: 2 mg via INTRAMUSCULAR
  Filled 2014-12-20: qty 1

## 2014-12-20 MED ORDER — ONDANSETRON 4 MG PO TBDP: 4.0000 mg | ORAL_TABLET | Freq: Four times a day (QID) | ORAL | Status: DC | PRN

## 2014-12-20 NOTE — ED Notes (Signed)
Pt appears confused, wanting to "finish cutting the grass"  Prn ativan given per NP order.

## 2014-12-20 NOTE — ED Notes (Signed)
Pt. Noted sleeping in room. No complaints or concerns voiced. No distress or abnormal behavior noted. Will continue to monitor with security cameras. Q 15 minute rounds continue. 

## 2014-12-20 NOTE — ED Provider Notes (Signed)
Please see previous provider's notes regarding patient's presenting history and physical, initial ED course, and associated MDM. 54 year old male with prior history of psychotic disorder on Haldol who had presented with alcohol intoxication with homicidal and suicidal thoughts. Was here on December 9, and has not had any further alcohol intake. There was concern for alcohol withdrawal on my shift. On my evaluation, patient now has auditory and visual hallucinations with tremulousness. Seems confused and unable to provide coherent history. He is hemodynamically stable. Has a CIWA score of 21. Given 2 mg and 4 mg ativan. Discussed with Dr. Allena KatzPatel and admitted to stepdown for possible delirium tremens.  Lavera Guiseana Duo Cornie Herrington, MD 12/21/14 [phone]

## 2014-12-20 NOTE — Progress Notes (Signed)
[facility] once bed available. Rosey BathKelly Brylie Sneath, RN

## 2014-12-20 NOTE — ED Notes (Addendum)
RN unable to complete CIWA as Pt is sleeping.  Safety sitter bedside.

## 2014-12-20 NOTE — ED Notes (Addendum)
Safety sitter called Rn to Pt bedside at 2135 .  Pt thrashing  in the bed with significant tremoring.  Pt was arousable  To voice and light touch and able to respond to RN though speech was garbled.  Ativan given per CIWA protocol.
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
## 2014-12-20 NOTE — ED Provider Notes (Signed)
54 yo M patient is currently in cycle the waiting palpation placement. I was called back to the room as the patient is having some active hallucinations. On my exam patient not actively hallucinating blunted affect not directly answering my questions. Psychiatric providers concern for delirium tremens is going to give him IV thiamine. Will continue on CIWA protocol.  Melene Planan Kariana Wiles, DO 12/20/14 1429

## 2014-12-20 NOTE — ED Notes (Signed)
Bed: WA20 Expected date:  Expected time:  Means of arrival:  [facility]

## 2014-12-20 NOTE — ED Notes (Signed)
Pt denies SI but claims HI toward someone who accused him of using cocaine.  Pt claims he has been clean from drugs 15 years and now wants to get clean from alcohol.  Pt shows no symptoms of withdrawal.  15 minute checks and video monitoring continue.

## 2014-12-20 NOTE — ED Notes (Signed)
Patient is becoming more and more confused.  EDP and NP notified.  Order obtained to transfer pt. To room 20.  Attempted to call report.

## 2014-12-21 MED ORDER — LORAZEPAM 2 MG/ML IJ SOLN: 1.0000 mg | Freq: Four times a day (QID) | INTRAMUSCULAR | Status: DC | PRN

## 2014-12-21 MED ORDER — FLUOXETINE HCL 20 MG PO CAPS
20.0000 mg | ORAL_CAPSULE | Freq: Every day | ORAL | Status: DC
  Administered 2014-12-22: 20 mg via ORAL
  Filled 2014-12-21: qty 1

## 2014-12-21 MED ORDER — HALOPERIDOL 2 MG PO TABS
3.0000 mg | ORAL_TABLET | Freq: Every day | ORAL | Status: DC
  Administered 2014-12-21: 3 mg via ORAL
  Filled 2014-12-21 (×2): qty 1

## 2014-12-21 MED ORDER — LORAZEPAM 1 MG PO TABS: 1.0000 mg | ORAL_TABLET | Freq: Four times a day (QID) | ORAL | Status: DC | PRN

## 2014-12-21 MED ORDER — ADULT MULTIVITAMIN W/MINERALS CH
1.0000 | ORAL_TABLET | Freq: Every day | ORAL | Status: DC
  Administered 2014-12-21 – 2014-12-22 (×2): 1 via ORAL
  Filled 2014-12-21 (×2): qty 1

## 2014-12-21 MED ORDER — FOLIC ACID 1 MG PO TABS
1.0000 mg | ORAL_TABLET | Freq: Every day | ORAL | Status: DC
  Administered 2014-12-21 – 2014-12-22 (×2): 1 mg via ORAL
  Filled 2014-12-21 (×2): qty 1

## 2014-12-21 NOTE — Progress Notes (Signed)
[facility] Progress Note    Patient: Rodney Hartman    ZOX:[id number]  DOB: 04-Feb-1960     DOA: 12/17/2014 Date of Service: the patient was seen and examined on 12/21/2014  Subjective: Patient denies any acute complaint. He is not willing to talk about suicidal or homicidal ideation with me. Nutrition: Able to tolerate oral diet Activity: Ambulating in the room Last BM: Prior to arrival  Assessment and Plan: 1. MDD (major depressive disorder), recurrent, severe, with psychosis (HCC) Patient initially presented in the ER and was placed in the ED psych hold. The patient was found to be having homicidal as well as suicidal ideation initially. Requested psychiatric consultation and appreciate their input today. Patient appears to be medically cleared and therefore social worker will start working on finding a place for the patient. Medication adjustment per psychiatric  2. Delirium tremens, alcohol withdrawal. Patient yesterday had significantly elevated CIWA score. After one dose of lorazepam he remains calm and does not have any further episodes of agitation or hallucination. We'll continue close monitoring and continue CIWA protocol. Continue daily thiamine and folic acid.   DVT Prophylaxis: subcutaneous Heparin Nutrition: Regular diet Advance goals of care discussion: Full code  Brief Summary of Hospitalization:  HPI: As per the H and P dictated on admission, "Rodney Hartman is a 54 y.o. male with Past medical history of major depression, substance abuse, alcohol abuse. The history is limited as since the patient is seen after receiving IV lorazepam. History is taken from ED documentation as well as nurse taking care of the patient. Patient initially presented on 12/17/2014, patient called [employer] and told them that he was drinking too much and needs some help. Patient was homicidal in the ER but apparently did not have any plan. He also initially had suicidal ideation. Psychiatry  will or the patient and recommended inpatient psychiatric admission. Patient was intoxicated and was placed in psych hold and was placed on CIWA protocol. Over the course of the stay in the ED hold the patient started having hallucination. He was homicidal earlier in the morning towards someone was accusing him using cocaine. Patient was also saying to finish cutting grass. Due to his progressive confusion the patient was transferred back to [facility] long ED where his CIWA score was 21 and therefore Hospital as was called for admission for possible delirium tremens. Patient the time of my evaluation was under the effect of lorazepam, and comfortable. Has some tremors." Daily update: 12/21/2014 psychiatric consulted Consultants: Psychiatric Procedures: None Antibiotics: Anti-infectives    None      Family Communication: No family was present at bedside, at the time of interview.   Disposition:  Expected discharge date: 12/22/2014 Barriers to safe discharge: Placement   Intake/Output Summary (Last 24 hours) at 12/21/14 1728 Last data filed at 12/21/14 1542  Gross per 24 hour  Intake    600 ml  Output    400 ml  Net    200 ml   Filed Weights   12/17/14 0102 12/20/14 2240  Weight: 85.73 kg (189 lb) 83.054 kg (183 lb 1.6 oz)    Objective: Physical Exam: Filed Vitals:   12/20/14 2240 12/20/14 2244 12/21/14 0614 12/21/14 1152  BP:  125/92 107/72 105/68  Pulse:  75 73 86  Temp:  97.2 F (36.2 C) 97.6 F (36.4 C) 98 F (36.7 C)  TempSrc:  Axillary Oral Oral  Resp:  Height:      Weight: 83.054 kg (  183 lb 1.6 oz)     SpO2:  100% 100% 100%     General: Appear in mild distress, no Rash; Oral Mucosa moist. Cardiovascular: S1 and S2 Present, no Murmur, no JVD Respiratory: Bilateral Air entry present and Clear to Auscultation, no Crackles, no wheezes Abdomen: Bowel Sound present, Soft and no tenderness Extremities: no Pedal edema, no calf tenderness Neurology: Grossly  no focal neuro deficit.  Data Reviewed: CBC:  Recent Labs Lab 12/17/14 0120 12/20/14 1851  WBC 6.1 5.0  HGB 12.9* 11.6*  HCT 38.4* 35.4*  MCV 93.2 94.4  PLT 315 204   Basic Metabolic Panel:  Recent Labs Lab 12/17/14 0120 12/20/14 1851  NA 142 134*  K 4.3 4.1  CL 105 100*  CO2 28 27  GLUCOSE 104* 93  BUN <5* 15  CREATININE 0.81 0.84  CALCIUM 9.3 9.4  MG  --  1.9  PHOS  --  3.7   Liver Function Tests:  Recent Labs Lab 12/17/14 0120 12/20/14 1851  AST 36 33  ALT 22 20  ALKPHOS 101 70  BILITOT 0.6 0.6  PROT 8.5* 7.3  ALBUMIN 4.2 3.9   No results for input(s): LIPASE, AMYLASE in the last 168 hours.  Recent Labs Lab 12/20/14 1852  AMMONIA 19    Cardiac Enzymes: No results for input(s): CKTOTAL, CKMB, CKMBINDEX, TROPONINI in the last 168 hours. BNP (last 3 results) No results for input(s): BNP in the last 8760 hours.  ProBNP (last 3 results) No results for input(s): PROBNP in the last 8760 hours.   CBG: No results for input(s): GLUCAP in the last 168 hours.  No results found for this or any previous visit (from the past 240 hour(s)).   Studies: No results found.   Scheduled Meds: . benztropine  0.5 mg Oral QHS  . [START ON 12/22/2014] FLUoxetine  20 mg Oral Daily  . folic acid  1 mg Oral Daily  . haloperidol  3 mg Oral QHS  . heparin  5,000 Units Subcutaneous 3 times per day  . multivitamin with minerals  1 tablet Oral Daily  . nicotine  21 mg Transdermal Daily  . pantoprazole  20 mg Oral Daily  . sodium chloride  3 mL Intravenous Q12H  . [START ON 12/23/2014] thiamine  100 mg Oral Daily   Continuous Infusions:  PRN Meds: acetaminophen **OR** acetaminophen, ibuprofen, LORazepam **OR** LORazepam, ondansetron **OR** ondansetron (ZOFRAN) IV, traZODone  Time spent: 30 minutes  Author: Lynden OxfordPranav Patel, MD [facility] Pager: [phone] 12/21/2014 5:28 PM  If 7PM-7AM, please contact night-coverage at [email or web address], password [email or web address]
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
## 2014-12-22 DIAGNOSIS — R45851 Suicidal ideations: Secondary | ICD-10-CM | POA: Diagnosis not present

## 2014-12-22 DIAGNOSIS — F333 Major depressive disorder, recurrent, severe with psychotic symptoms: Secondary | ICD-10-CM | POA: Diagnosis not present

## 2014-12-22 MED ORDER — FOLIC ACID 1 MG PO TABS: 1.0000 mg | ORAL_TABLET | Freq: Every day | ORAL | Status: DC

## 2014-12-22 MED ORDER — HALOPERIDOL 2 MG PO TABS: 2.0000 mg | ORAL_TABLET | Freq: Every day | ORAL | Status: DC

## 2014-12-22 MED ORDER — SIMETHICONE 80 MG PO CHEW: 80.0000 mg | CHEWABLE_TABLET | Freq: Four times a day (QID) | ORAL | Status: DC

## 2014-12-22 MED ORDER — BENZTROPINE MESYLATE 0.5 MG PO TABS: 0.5000 mg | ORAL_TABLET | Freq: Every day | ORAL | Status: DC

## 2014-12-22 MED ORDER — LISINOPRIL 10 MG PO TABS: 10.0000 mg | ORAL_TABLET | Freq: Every day | ORAL | Status: DC

## 2014-12-22 MED ORDER — TRAZODONE HCL 50 MG PO TABS: 50.0000 mg | ORAL_TABLET | Freq: Every evening | ORAL | Status: DC | PRN

## 2014-12-22 MED ORDER — THIAMINE HCL 100 MG PO TABS: 100.0000 mg | ORAL_TABLET | Freq: Every day | ORAL | Status: DC

## 2014-12-22 MED ORDER — FLUOXETINE HCL 20 MG PO CAPS: 20.0000 mg | ORAL_CAPSULE | Freq: Every day | ORAL | Status: DC

## 2014-12-22 MED ORDER — HALOPERIDOL 2 MG PO TABS: 3.0000 mg | ORAL_TABLET | Freq: Every day | ORAL | Status: DC

## 2014-12-22 MED ORDER — HALOPERIDOL 2 MG PO TABS
2.0000 mg | ORAL_TABLET | Freq: Every day | ORAL | Status: DC
  Filled 2014-12-22: qty 1

## 2014-12-22 MED ORDER — SIMETHICONE 80 MG PO CHEW
80.0000 mg | CHEWABLE_TABLET | Freq: Four times a day (QID) | ORAL | Status: DC
  Administered 2014-12-22 (×3): 80 mg via ORAL
  Filled 2014-12-22 (×4): qty 1

## 2014-12-22 MED ORDER — NICOTINE 21 MG/24HR TD PT24: 21.0000 mg | MEDICATED_PATCH | Freq: Every day | TRANSDERMAL | Status: DC

## 2014-12-22 MED ORDER — ACETAMINOPHEN 325 MG PO TABS: 625.0000 mg | ORAL_TABLET | Freq: Four times a day (QID) | ORAL | Status: DC | PRN
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
## 2014-12-22 NOTE — Progress Notes (Signed)
Nutrition Brief Note  Patient identified on the Malnutrition Screening Tool (MST) Report  Wt Readings from Last 15 Encounters:  12/22/14 182 lb 8 oz (82.781 kg)  11/26/14 182 lb 15.7 oz (83 kg)  08/07/14 172 lb (78.019 kg)  08/06/14 177 lb (80.287 kg)  04/05/14 198 lb (89.812 kg)  06/04/13 210 lb (95.255 kg)    Body mass index is 25.46 kg/(m^2). Patient meets criteria for overweight based on current BMI.   Pt mainly mumbles answers, keeps eyes closed and head turned during discussion. He states that he ate breakfast this AM. He states he had some abdominal pain before starting this meal but that this pain was not exacerbated by eating. Pt states UBW of 188 lbs and that he has lost weight recently. Chart review indicates stable weight x1 month after weight gain of 10 lbs from July 2016-November 2016. Pt has been eating 100% of meals since transfer to the floor 2 days ago.  Current diet order is Heart Healthy, patient is consuming approximately 100% of meals at this time. Labs and medications reviewed.   No nutrition interventions warranted at this time. If nutrition issues arise, please consult RD.      Trenton GammonJessica Jaion Lagrange, RD, LDN Inpatient Clinical Dietitian Pager # [phone] After hours/weekend pager # [phone]

## 2014-12-22 NOTE — Progress Notes (Signed)
[facility] for [facility]. Inetta Fermoina, [facility] to review and advise-  Reece LevyJanet Shanena Pellegrino, MSW, Theresia MajorsLCSWA  [phone]/

## 2014-12-22 NOTE — Progress Notes (Signed)
EDCM consulted to speak to patient regarding finding a pcp.  EDCM spoke to patient at bedside.  Patient reports he has an orange card and it expires Feb 2017.  EDCM explained to patient that on the bottom right hand corner of his orange card is the name of his pcp.  [facility]  [facility] explained to [facility] that [facility] years ago.  Patient reports it is in the same place.  EDCM mentioned [facility] [address]  Patient confirms that [facility] is his pcp.  EDCM also supplied patient with list of discount pharmacies, and websites needymeds.org and goodrx.com for medication assistance, list of agencies that provide financial services such as [employer],,list of dentists who accept uninsured patients, contact information for [facility], list of pcps who accept uninsured patients.  Patient thankful for resources.  No further EDCM needs at this time.
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
## 2015-01-02 ENCOUNTER — Emergency Department (HOSPITAL_COMMUNITY)
Admission: EM | Admit: 2015-01-02 | Discharge: 2015-01-03 | Disposition: A | Discharge status: Home Health | Payer: Federal, State, Local not specified - Other | Attending: Emergency Medicine | Admitting: Emergency Medicine

## 2015-01-02 ENCOUNTER — Encounter (HOSPITAL_COMMUNITY): Payer: Self-pay | Admitting: Emergency Medicine

## 2015-01-02 DIAGNOSIS — F1020 Alcohol dependence, uncomplicated: Secondary | ICD-10-CM | POA: Diagnosis present

## 2015-01-02 DIAGNOSIS — R443 Hallucinations, unspecified: Secondary | ICD-10-CM | POA: Insufficient documentation

## 2015-01-02 DIAGNOSIS — R451 Restlessness and agitation: Secondary | ICD-10-CM | POA: Insufficient documentation

## 2015-01-02 DIAGNOSIS — F1210 Cannabis abuse, uncomplicated: Secondary | ICD-10-CM | POA: Insufficient documentation

## 2015-01-02 DIAGNOSIS — F17210 Nicotine dependence, cigarettes, uncomplicated: Secondary | ICD-10-CM | POA: Insufficient documentation

## 2015-01-02 DIAGNOSIS — R45850 Homicidal ideations: Secondary | ICD-10-CM | POA: Insufficient documentation

## 2015-01-02 DIAGNOSIS — F1010 Alcohol abuse, uncomplicated: Secondary | ICD-10-CM | POA: Insufficient documentation

## 2015-01-02 DIAGNOSIS — R45851 Suicidal ideations: Secondary | ICD-10-CM

## 2015-01-02 DIAGNOSIS — F1094 Alcohol use, unspecified with alcohol-induced mood disorder: Secondary | ICD-10-CM | POA: Diagnosis present

## 2015-01-02 DIAGNOSIS — R454 Irritability and anger: Secondary | ICD-10-CM | POA: Insufficient documentation

## 2015-01-02 DIAGNOSIS — I10 Essential (primary) hypertension: Secondary | ICD-10-CM | POA: Insufficient documentation

## 2015-01-02 DIAGNOSIS — F1310 Sedative, hypnotic or anxiolytic abuse, uncomplicated: Secondary | ICD-10-CM | POA: Insufficient documentation

## 2015-01-02 DIAGNOSIS — Z79899 Other long term (current) drug therapy: Secondary | ICD-10-CM | POA: Insufficient documentation

## 2015-01-02 LAB — CBC
HEMATOCRIT: 44.1 % (ref 39.0–52.0)
HEMOGLOBIN: 14.4 g/dL (ref 13.0–17.0)
MCH: 30.8 pg (ref 26.0–34.0)
MCHC: 32.7 g/dL (ref 30.0–36.0)
MCV: 94.4 fL (ref 78.0–100.0)
Platelets: 447 10*3/uL — ABNORMAL HIGH (ref 150–400)
RBC: 4.67 MIL/uL (ref 4.22–5.81)
RDW: 15.9 % — AB (ref 11.5–15.5)
WBC: 5.4 10*3/uL (ref 4.0–10.5)

## 2015-01-02 LAB — RAPID URINE DRUG SCREEN, HOSP PERFORMED
Amphetamines: NOT DETECTED
Barbiturates: NOT DETECTED
Benzodiazepines: POSITIVE — AB
Cocaine: NOT DETECTED
Opiates: NOT DETECTED
Tetrahydrocannabinol: POSITIVE — AB

## 2015-01-02 LAB — COMPREHENSIVE METABOLIC PANEL
ALT: 25 U/L (ref 17–63)
AST: 31 U/L (ref 15–41)
Albumin: 4.4 g/dL (ref 3.5–5.0)
Alkaline Phosphatase: 99 U/L (ref 38–126)
Anion gap: 12 (ref 5–15)
BUN: <5 mg/dL — ABNORMAL LOW (ref 6–20)
CO2: 27 mmol/L (ref 22–32)
Calcium: 9.5 mg/dL (ref 8.9–10.3)
Chloride: 107 mmol/L (ref 101–111)
Creatinine, Ser: 0.89 mg/dL (ref 0.61–1.24)
GFR calc Af Amer: >60 mL/min (ref >60)
GFR calc non Af Amer: >60 mL/min (ref >60)
Glucose, Bld: 112 mg/dL — ABNORMAL HIGH (ref 65–99)
Potassium: 3.9 mmol/L (ref 3.5–5.1)
Sodium: 146 mmol/L — ABNORMAL HIGH (ref 135–145)
Total Bilirubin: 0.5 mg/dL (ref 0.3–1.2)
Total Protein: 8.5 g/dL — ABNORMAL HIGH (ref 6.5–8.1)

## 2015-01-02 LAB — ETHANOL: Alcohol, Ethyl (B): 358 mg/dL (ref <5)

## 2015-01-02 LAB — ACETAMINOPHEN LEVEL: Acetaminophen (Tylenol), Serum: <10 ug/mL — ABNORMAL LOW (ref 10–30)

## 2015-01-02 LAB — SALICYLATE LEVEL: Salicylate Lvl: <4 mg/dL (ref 2.8–30.0)

## 2015-01-02 MED ORDER — LORAZEPAM 1 MG PO TABS: 0.0000 mg | ORAL_TABLET | Freq: Four times a day (QID) | ORAL | Status: DC

## 2015-01-02 MED ORDER — LORAZEPAM 1 MG PO TABS: 0.0000 mg | ORAL_TABLET | Freq: Two times a day (BID) | ORAL | Status: DC

## 2015-01-02 MED ORDER — VITAMIN B-1 100 MG PO TABS
100.0000 mg | ORAL_TABLET | Freq: Every day | ORAL | Status: DC
  Administered 2015-01-03: 100 mg via ORAL
  Filled 2015-01-02: qty 1

## 2015-01-02 MED ORDER — THIAMINE HCL 100 MG/ML IJ SOLN: 100.0000 mg | Freq: Every day | INTRAMUSCULAR | Status: DC

## 2015-01-02 NOTE — ED Notes (Signed)
Pt has a black and green cooler with his clothes in it. Pt has a set of 2 cowboy glasses and a tube of deoderant in the cooler as well. Pt also has a belongings bag consisting of the clothes he wore here. Shirt pants socks slippers and cowboy tobaggan.

## 2015-01-02 NOTE — ED Provider Notes (Signed)
CSN: [id number]     Arrival date & time 01/02/15  2148 History   By signing my name below, I, [facility], attest that this documentation has been prepared under the direction and in the presence of Draeden Kellman Camprubi-Soms, PA. Electronically Signed: Randell Patient, ED Scribe. 01/02/2015. 11:13 PM.   Chief Complaint  Patient presents with  . Suicidal   Patient is a 54 y.o. male presenting with mental health disorder. The history is provided by the patient and medical records. No language interpreter was used.  Mental Health Problem Presenting symptoms: agitation, hallucinations, homicidal ideas and suicidal thoughts   Patient accompanied by:  Law enforcement Onset quality:  Unable to specify Timing:  Constant Progression:  Unable to specify Chronicity:  Recurrent Context: alcohol use and noncompliance   Treatment compliance:  Untreated Time since last psychoactive medication taken:  11 days Relieved by:  None tried Worsened by:  Nothing tried Ineffective treatments:  None tried Associated symptoms: no abdominal pain and no chest pain   Risk factors: hx of mental illness and recent psychiatric admission    HPI Comments: Rodney Hartman is a 54 y.o. male with a PMHx of HTN and alcohol abuse, who presents to the Emergency Department complaining of SI and HI with a plan for both. He states he plans to injure himself with a knife and hurt 2 other individuals by "blowing their brains out." Patient endorses hallucinations that he converses with a "small dog" present in the room. He also reports that he drank one 40 oz beer earlier today around midday. Patient was admitted to the [facility] ED on 12/9-14/16 for similar symptoms, ended up being admitted for DTs, was discharged home to f/[facility]. Pt states he hasn't been taking any of his medications since then because he can't afford them. He denies illicit drug use, fevers, chills, CP, SOB, abd pain, N/V/D/C, hematuria,  dysuria, myalgias, arthralgias, numbness, tingling, or weakness. Patient is a cigarette smoker. Pt brought in by [employer], no IVC paperwork taken out prior to arrival.  Past Medical History  Diagnosis Date  . Hypertension    Past Surgical History  Procedure Laterality Date  . Tonsillectomy    . 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    No family history on file. Social History  Substance Use Topics  . Smoking status: Current Some Day Smoker -- 0.50 packs/day    Types: Cigarettes  . Smokeless tobacco: None  . Alcohol Use: 3.6 oz/week    6 Cans of beer per week     Comment: every other day     Review of Systems  Constitutional: Negative for fever and chills.  Respiratory: Negative for shortness of breath.   Cardiovascular: Negative for chest pain.  Gastrointestinal: Negative for nausea, vomiting, abdominal pain, diarrhea and constipation.  Genitourinary: Negative for dysuria and hematuria.  Musculoskeletal: Negative for myalgias and arthralgias.  Skin: Negative for color change.  Allergic/Immunologic: Negative for immunocompromised state.  Neurological: Negative for weakness and numbness.  Psychiatric/Behavioral: Positive for suicidal ideas, homicidal ideas, hallucinations and agitation. Negative for confusion.   10 Systems reviewed and all are negative for acute change except as noted in the HPI.     Allergies  Review of patient's allergies indicates no known allergies.  Home Medications   Prior to Admission medications   Medication Sig Start Date End Date Taking? Authorizing Provider  acetaminophen (TYLENOL) 325 MG tablet Take 2 tablets (650 mg total) by mouth every 6 (six) hours as needed for moderate pain. 12/22/14   Rolly Salter, MD  benztropine (COGENTIN) 0.5 MG tablet Take 1 tablet (0.5 mg total) by mouth at bedtime. 12/22/14   Rolly Salter, MD  FLUoxetine (PROZAC) 10 MG capsule Take  1 capsule (10 mg total) by mouth daily. 08/12/14   Beau Fanny, FNP  FLUoxetine (PROZAC) 20 MG capsule Take 1 capsule (20 mg total) by mouth daily. 12/22/14   Rolly Salter, MD  folic acid (FOLVITE) 1 MG tablet Take 1 tablet (1 mg total) by mouth daily. 12/22/14   Rolly Salter, MD  haloperidol (HALDOL) 2 MG tablet Take 1 tablet (2 mg total) by mouth at bedtime. 12/22/14   Rolly Salter, MD  hydrOXYzine (ATARAX/VISTARIL) 25 MG tablet Take 1 tablet (25 mg total) by mouth 3 (three) times daily as needed for anxiety. 08/12/14   Beau Fanny, FNP  ibuprofen (ADVIL,MOTRIN) 200 MG tablet Take 400 mg by mouth every 6 (six) hours as needed for moderate pain.    Historical Provider, MD  lisinopril (PRINIVIL,ZESTRIL) 10 MG tablet Take 1 tablet (10 mg total) by mouth daily. 12/22/14   Rolly Salter, MD  nicotine (NICODERM CQ - DOSED IN MG/24 HOURS) 21 mg/24hr patch Place 1 patch (21 mg total) onto the skin daily. 12/22/14   Rolly Salter, MD  oxyCODONE-acetaminophen (PERCOCET) 10-325 MG tablet Take 0.5-1 tablets by mouth every 6 (six) hours as needed for pain. 11/30/14   Tiffany Neva Seat, PA-C  PANTOPRAZOLE SODIUM PO Take 1 tablet by mouth daily.    Historical Provider, MD  simethicone (MYLICON) 80 MG chewable tablet Chew 1 tablet (80 mg total) by mouth 4 (four) times daily. 12/22/14   Rolly Salter, MD  thiamine 100 MG tablet Take 1 tablet (100 mg total) by mouth daily. 12/23/14   Rolly Salter, MD  traZODone (DESYREL) 50 MG tablet Take 1 tablet (50 mg total) by mouth at bedtime as needed for sleep. 12/22/14   Rolly Salter, MD   BP 133/98 mmHg  Pulse 79  Temp(Src) 98 F (36.7 C) (Oral)  Resp 20  SpO2 98% Physical Exam  Constitutional: He is oriented to person, place, and time. Vital signs are normal. He appears well-developed and well-nourished.  Non-toxic appearance. No distress.  Afebrile, nontoxic, NAD  HENT:  Head: Normocephalic and atraumatic.  Mouth/Throat: Oropharynx is clear and moist  and mucous membranes are normal.  Eyes: Conjunctivae and EOM are normal. Right eye exhibits no discharge. Left eye exhibits no discharge.  Neck: Normal range of motion. Neck supple.  Cardiovascular: Normal rate, regular rhythm, normal heart sounds and intact distal pulses.  Exam reveals no gallop and no friction rub.   No murmur heard. Pulmonary/Chest: Effort normal and breath sounds normal. No respiratory distress. He has no decreased breath sounds. He has no wheezes. He has no rhonchi. He has no rales.  Abdominal: Soft. Normal appearance and bowel sounds are normal. He exhibits no distension. There is no tenderness. There is no rigidity, no rebound, no guarding, no CVA tenderness, no tenderness at McBurney's point and negative Murphy's sign.  Musculoskeletal: Normal range of motion.  Neurological: He is alert and oriented to person, place, and time. He has normal strength. He displays no tremor. No sensory deficit.  No tremors noted  Skin: Skin is warm, dry and intact. No rash noted.  Psychiatric: His  affect is angry. He is actively hallucinating. He expresses homicidal and suicidal ideation. He expresses suicidal plans and homicidal plans.  Somewhat angry/agitated affect, but cooperative with questioning. Endorsing SI/HI with a plan for both. Also reports that he has a "small dog" that he sees and talks to.  Nursing note and vitals reviewed.   ED Course  Procedures   DIAGNOSTIC STUDIES: Oxygen Saturation is 98% on RA, normal by my interpretation.    COORDINATION OF CARE: 10:59 PM Will order labs. Discussed treatment plan with pt at bedside and pt agreed to plan.  Labs Review Labs Reviewed  COMPREHENSIVE METABOLIC PANEL - Abnormal; Notable for the following:    Sodium 146 (*)    Glucose, Bld 112 (*)    BUN <5 (*)    Total Protein 8.5 (*)    All other components within normal limits  ETHANOL - Abnormal; Notable for the following:    Alcohol, Ethyl (B) 358 (*)    All other  components within normal limits  ACETAMINOPHEN LEVEL - Abnormal; Notable for the following:    Acetaminophen (Tylenol), Serum <10 (*)    All other components within normal limits  CBC - Abnormal; Notable for the following:    RDW 15.9 (*)    Platelets 447 (*)    All other components within normal limits  URINE RAPID DRUG SCREEN, HOSP PERFORMED - Abnormal; Notable for the following:    Benzodiazepines POSITIVE (*)    Tetrahydrocannabinol POSITIVE (*)    All other components within normal limits  SALICYLATE LEVEL    Imaging Review No results found. I have personally reviewed and evaluated these images and lab results as part of my medical decision-making.   EKG Interpretation None      MDM   Final diagnoses:  Suicidal ideation  Hallucination  Alcohol abuse    54 y.o. male here with SI and HI with a plan for both, and actively hallucinating. Recently discharged after DTs from alcohol use, also had SI/HI during that time. Pt noncompliant with medications. No evidence of tremors at this time, doubt DTs currently, will get clearance labs and likely consult TTS, will monitor closely to ensure no evidence of DTs develops. Will write up IVC paperwork in case patient attempts to leave. Will reassess shortly.   12:27 AM UDS with +benzos and +THC. CMP with Na 146, will encourage PO intake. EtOH 358. CIWA 3. Salicylate and acetaminophen WNL. CBC WNL. Pt not exhibiting DTs at this time. Medically cleared, please see [facility] notes for further documentation of care. Psych hold orders placed.  I personally performed the services described in this documentation, which was scribed in my presence. The recorded information has been reviewed and is accurate.  BP 142/88 mmHg  Pulse 88  Temp(Src) 98 F (36.7 C) (Oral)  Resp 20  SpO2 98%  Meds ordered this encounter  Medications  . LORazepam (ATIVAN) tablet 0-4 mg    Sig:     Order Specific Question:  CIWA-AR < 5 =    Answer:  0    Order  Specific Question:  CIWA-AR 5 -10 =    Answer:  1 mg    Order Specific Question:  CIWA-AR 11 -15 =    Answer:  2 mg    Order Specific Question:  CIWA-AR 16 -24 =    Answer:  2 mg    Order Specific Question:  CIWA-AR 16 -24 =    Answer:  Recheck CIWA-AR in 1 hour; if >  15 notify MD    Order Specific Question:  CIWA-AR > 24 =    Answer:  4 mg    Order Specific Question:  CIWA-AR > 24 =    Answer:  Call Rapid Response   Followed by  . LORazepam (ATIVAN) tablet 0-4 mg    Sig:     Order Specific Question:  CIWA-AR < 5 =    Answer:  0    Order Specific Question:  CIWA-AR 5 -10 =    Answer:  1 mg    Order Specific Question:  CIWA-AR 11 -15 =    Answer:  2 mg    Order Specific Question:  CIWA-AR 16 -24 =    Answer:  2 mg    Order Specific Question:  CIWA-AR 16 -24 =    Answer:  Recheck CIWA-AR in 1 hour; if > 15 notify MD    Order Specific Question:  CIWA-AR > 24 =    Answer:  4 mg    Order Specific Question:  CIWA-AR > 24 =    Answer:  Call Rapid Response  . thiamine (VITAMIN B-1) tablet 100 mg    Sig:    Or  . thiamine (B-1) injection 100 mg    Sig:   . alum & mag hydroxide-simeth (MAALOX/MYLANTA) 200-200-20 MG/5ML suspension 30 mL    Sig:   . ondansetron (ZOFRAN) tablet 4 mg    Sig:   . nicotine (NICODERM CQ - dosed in mg/24 hours) patch 21 mg    Sig:   . zolpidem (AMBIEN) tablet 5 mg    Sig:   . ibuprofen (ADVIL,MOTRIN) tablet 600 mg    Sig:   . acetaminophen (TYLENOL) tablet 650 mg    Sig:       Evonne Rinks Camprubi-Soms, PA-C 01/03/15 0029  Layla MawKristen N Ward, DO 01/03/15 [id number]

## 2015-01-02 NOTE — ED Notes (Addendum)
Pt transported from home by EMS after [employer] called by family for SI, pt reports to EMS if he had a gun he would shoot himself in the head. Pt reports he has lost several family members recently, pt also has hx of alcohol abuse.  Pt continues to endorse SI, HI. Denies hallucinations. Pt continually referencing the deaths in his family as source of anxiety. Pt appears very agitated. Meal provided.

## 2015-01-03 DIAGNOSIS — F1010 Alcohol abuse, uncomplicated: Secondary | ICD-10-CM

## 2015-01-03 DIAGNOSIS — F1094 Alcohol use, unspecified with alcohol-induced mood disorder: Secondary | ICD-10-CM | POA: Diagnosis present

## 2015-01-03 MED ORDER — IBUPROFEN 200 MG PO TABS
600.0000 mg | ORAL_TABLET | Freq: Three times a day (TID) | ORAL | Status: DC | PRN
  Administered 2015-01-03: 600 mg via ORAL
  Filled 2015-01-03: qty 3

## 2015-01-03 MED ORDER — ONDANSETRON HCL 4 MG PO TABS: 4.0000 mg | ORAL_TABLET | Freq: Three times a day (TID) | ORAL | Status: DC | PRN

## 2015-01-03 MED ORDER — ACETAMINOPHEN 325 MG PO TABS
650.0000 mg | ORAL_TABLET | ORAL | Status: DC | PRN
  Administered 2015-01-03: 650 mg via ORAL
  Filled 2015-01-03: qty 2

## 2015-01-03 MED ORDER — ALUM & MAG HYDROXIDE-SIMETH 200-200-20 MG/5ML PO SUSP: 30.0000 mL | ORAL | Status: DC | PRN

## 2015-01-03 MED ORDER — ZOLPIDEM TARTRATE 5 MG PO TABS: 5.0000 mg | ORAL_TABLET | Freq: Every evening | ORAL | Status: DC | PRN

## 2015-01-03 MED ORDER — NICOTINE 21 MG/24HR TD PT24
21.0000 mg | MEDICATED_PATCH | Freq: Every day | TRANSDERMAL | Status: DC
  Administered 2015-01-03: 21 mg via TRANSDERMAL
  Filled 2015-01-03: qty 1
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
## 2015-01-03 NOTE — Progress Notes (Signed)
CSW provided patient with shelter information and a bus pass for discharge. Patient was asleep upon entrance. Patient had no questions for CSW at the time.   Elenore PaddyLaVonia Chae Oommen, LCSWA [phone] ED CSW 01/03/2015 11:52 AM
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
## 2015-01-03 NOTE — ED Notes (Signed)
Pt has one black bag and one pt belonging bag locked in locker 29.

## 2015-01-03 NOTE — Discharge Instructions (Signed)
To help you maintain a sober lifestyle, a substance abuse treatment program may be beneficial to you.  Contact one of the following providers to ask about enrolling in their program:  RESIDENTIAL PROGRAMS:       [facility]      [address]      [place], [place] [address]      [phone]       [facility]      [address]      [place], [place] [address]      [phone]       [facility]      [address]      [place], [place] [address]      [phone]  OUTPATIENT PROGRAMS:       [facility]      [address]      [place], [place] [address]      [phone]      New patients are seen at the walk-in clinic every Tuesday from 9:00 am - 12:00 pm.
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
## 2015-01-03 NOTE — [facility] Assessment (Signed)
[facility] Assessment Progress Note  Per Thedore MinsMojeed Akintayo, MD, this pt does not require psychiatric hospitalization at this time.  Pt is under IVC initiated by EDP Rochele RaringKristen Ward, MD.  Petition is to be rescinded and [facility] referral information for area substance abuse treatment providers.  Petition has been rescinded.  Referral information for [facility], [facility], [facility], and [facility] has been included in discharge instructions.  Pt's nurse has been notified.  Doylene Canninghomas [place] Mincey, MA Triage Specialist [phone]

## 2015-01-24 ENCOUNTER — Inpatient Hospital Stay (HOSPITAL_COMMUNITY)
Admission: AD | Admit: 2015-01-24 | Discharge: 2015-01-28 | DRG: 897 | Disposition: A | Discharge status: Other Acute Inpatient Hospital | Payer: Federal, State, Local not specified - Other | Attending: Psychiatry | Admitting: Psychiatry

## 2015-01-24 ENCOUNTER — Emergency Department (HOSPITAL_COMMUNITY)
Admission: EM | Admit: 2015-01-24 | Discharge: 2015-01-24 | Disposition: A | Discharge status: Other Acute Inpatient Hospital | Payer: Federal, State, Local not specified - Other | Attending: Emergency Medicine | Admitting: Emergency Medicine

## 2015-01-24 DIAGNOSIS — R45851 Suicidal ideations: Secondary | ICD-10-CM

## 2015-01-24 DIAGNOSIS — F1014 Alcohol abuse with alcohol-induced mood disorder: Secondary | ICD-10-CM | POA: Diagnosis present

## 2015-01-24 DIAGNOSIS — F333 Major depressive disorder, recurrent, severe with psychotic symptoms: Secondary | ICD-10-CM | POA: Diagnosis present

## 2015-01-24 DIAGNOSIS — F17210 Nicotine dependence, cigarettes, uncomplicated: Secondary | ICD-10-CM | POA: Diagnosis present

## 2015-01-24 DIAGNOSIS — F10229 Alcohol dependence with intoxication, unspecified: Principal | ICD-10-CM | POA: Diagnosis present

## 2015-01-24 DIAGNOSIS — F1094 Alcohol use, unspecified with alcohol-induced mood disorder: Secondary | ICD-10-CM | POA: Diagnosis present

## 2015-01-24 DIAGNOSIS — F1010 Alcohol abuse, uncomplicated: Secondary | ICD-10-CM

## 2015-01-24 DIAGNOSIS — F1020 Alcohol dependence, uncomplicated: Secondary | ICD-10-CM

## 2015-01-24 DIAGNOSIS — R45850 Homicidal ideations: Secondary | ICD-10-CM | POA: Diagnosis not present

## 2015-01-24 DIAGNOSIS — F1024 Alcohol dependence with alcohol-induced mood disorder: Secondary | ICD-10-CM | POA: Diagnosis not present

## 2015-01-24 DIAGNOSIS — I10 Essential (primary) hypertension: Secondary | ICD-10-CM | POA: Insufficient documentation

## 2015-01-24 DIAGNOSIS — E860 Dehydration: Secondary | ICD-10-CM | POA: Insufficient documentation

## 2015-01-24 DIAGNOSIS — F3289 Other specified depressive episodes: Secondary | ICD-10-CM | POA: Diagnosis not present

## 2015-01-24 LAB — BASIC METABOLIC PANEL
ANION GAP: 10 (ref 5–15)
BUN: 8 mg/dL (ref 6–20)
CALCIUM: 10.1 mg/dL (ref 8.9–10.3)
CHLORIDE: 109 mmol/L (ref 101–111)
CO2: 30 mmol/L (ref 22–32)
CREATININE: 0.91 mg/dL (ref 0.61–1.24)
GFR calc non Af Amer: >60 mL/min (ref >60)
Glucose, Bld: 98 mg/dL (ref 65–99)
Potassium: 4.7 mmol/L (ref 3.5–5.1)
SODIUM: 149 mmol/L — AB (ref 135–145)

## 2015-01-24 LAB — COMPREHENSIVE METABOLIC PANEL
ALBUMIN: 4.2 g/dL (ref 3.5–5.0)
ALT: 27 U/L (ref 17–63)
ANION GAP: 13 (ref 5–15)
AST: 46 U/L — ABNORMAL HIGH (ref 15–41)
Alkaline Phosphatase: 110 U/L (ref 38–126)
BUN: <5 mg/dL — ABNORMAL LOW (ref 6–20)
CALCIUM: 9.4 mg/dL (ref 8.9–10.3)
CO2: 29 mmol/L (ref 22–32)
Chloride: 108 mmol/L (ref 101–111)
Creatinine, Ser: 0.86 mg/dL (ref 0.61–1.24)
GFR calc non Af Amer: >60 mL/min (ref >60)
GLUCOSE: 89 mg/dL (ref 65–99)
POTASSIUM: 4.2 mmol/L (ref 3.5–5.1)
SODIUM: 150 mmol/L — AB (ref 135–145)
TOTAL PROTEIN: 7.9 g/dL (ref 6.5–8.1)
Total Bilirubin: 0.4 mg/dL (ref 0.3–1.2)

## 2015-01-24 LAB — CBC
HEMATOCRIT: 47.6 % (ref 39.0–52.0)
HEMOGLOBIN: 15.9 g/dL (ref 13.0–17.0)
MCH: 31.1 pg (ref 26.0–34.0)
MCHC: 33.4 g/dL (ref 30.0–36.0)
MCV: 93.2 fL (ref 78.0–100.0)
Platelets: 280 10*3/uL (ref 150–400)
RBC: 5.11 MIL/uL (ref 4.22–5.81)
RDW: 15.3 % (ref 11.5–15.5)
WBC: 3.2 10*3/uL — AB (ref 4.0–10.5)

## 2015-01-24 LAB — ETHANOL
Alcohol, Ethyl (B): 447 mg/dL (ref <5)
Alcohol, Ethyl (B): 41 mg/dL — ABNORMAL HIGH (ref <5)

## 2015-01-24 LAB — ACETAMINOPHEN LEVEL: Acetaminophen (Tylenol), Serum: <10 ug/mL — ABNORMAL LOW (ref 10–30)

## 2015-01-24 LAB — RAPID URINE DRUG SCREEN, HOSP PERFORMED
AMPHETAMINES: NOT DETECTED
BENZODIAZEPINES: NOT DETECTED
Barbiturates: NOT DETECTED
Cocaine: NOT DETECTED
Opiates: NOT DETECTED
TETRAHYDROCANNABINOL: NOT DETECTED

## 2015-01-24 LAB — SALICYLATE LEVEL: Salicylate Lvl: <4 mg/dL (ref 2.8–30.0)

## 2015-01-24 MED ORDER — HALOPERIDOL 2 MG PO TABS
2.0000 mg | ORAL_TABLET | Freq: Every day | ORAL | Status: DC
  Administered 2015-01-24: 2 mg via ORAL
  Filled 2015-01-24: qty 1

## 2015-01-24 MED ORDER — ACETAMINOPHEN 325 MG PO TABS
650.0000 mg | ORAL_TABLET | ORAL | Status: DC | PRN
  Filled 2015-01-24: qty 2

## 2015-01-24 MED ORDER — LIDOCAINE 5 % EX PTCH
1.0000 | MEDICATED_PATCH | Freq: Every day | CUTANEOUS | Status: DC
  Administered 2015-01-24: 1 via TRANSDERMAL
  Filled 2015-01-24: qty 1

## 2015-01-24 MED ORDER — CHLORDIAZEPOXIDE HCL 25 MG PO CAPS
25.0000 mg | ORAL_CAPSULE | Freq: Four times a day (QID) | ORAL | Status: DC | PRN
  Administered 2015-01-24: 25 mg via ORAL
  Filled 2015-01-24: qty 1

## 2015-01-24 MED ORDER — LORAZEPAM 1 MG PO TABS: 0.0000 mg | ORAL_TABLET | Freq: Two times a day (BID) | ORAL | Status: DC

## 2015-01-24 MED ORDER — HYDROXYZINE HCL 25 MG PO TABS
25.0000 mg | ORAL_TABLET | Freq: Four times a day (QID) | ORAL | Status: DC | PRN
  Administered 2015-01-24: 25 mg via ORAL
  Filled 2015-01-24: qty 1

## 2015-01-24 MED ORDER — PROMETHAZINE HCL 25 MG PO TABS
25.0000 mg | ORAL_TABLET | Freq: Three times a day (TID) | ORAL | Status: DC | PRN
  Administered 2015-01-24: 25 mg via ORAL
  Filled 2015-01-24: qty 1

## 2015-01-24 MED ORDER — LOPERAMIDE HCL 2 MG PO CAPS: 2.0000 mg | ORAL_CAPSULE | ORAL | Status: DC | PRN

## 2015-01-24 MED ORDER — ONDANSETRON 4 MG PO TBDP
4.0000 mg | ORAL_TABLET | Freq: Four times a day (QID) | ORAL | Status: DC | PRN
  Administered 2015-01-24: 4 mg via ORAL
  Filled 2015-01-24: qty 1

## 2015-01-24 MED ORDER — BENZTROPINE MESYLATE 1 MG PO TABS
0.5000 mg | ORAL_TABLET | Freq: Every day | ORAL | Status: DC
  Administered 2015-01-24: 0.5 mg via ORAL
  Filled 2015-01-24: qty 1

## 2015-01-24 MED ORDER — THIAMINE HCL 100 MG/ML IJ SOLN: 100.0000 mg | Freq: Every day | INTRAMUSCULAR | Status: DC

## 2015-01-24 MED ORDER — CHLORDIAZEPOXIDE HCL 25 MG PO CAPS: 25.0000 mg | ORAL_CAPSULE | Freq: Three times a day (TID) | ORAL | Status: DC

## 2015-01-24 MED ORDER — ADULT MULTIVITAMIN W/MINERALS CH
1.0000 | ORAL_TABLET | Freq: Every day | ORAL | Status: DC
  Administered 2015-01-24: 1 via ORAL
  Filled 2015-01-24: qty 1

## 2015-01-24 MED ORDER — LORAZEPAM 1 MG PO TABS
1.0000 mg | ORAL_TABLET | Freq: Four times a day (QID) | ORAL | Status: DC | PRN
Start: 2015-01-24 — End: 2015-01-24

## 2015-01-24 MED ORDER — NICOTINE 21 MG/24HR TD PT24
21.0000 mg | MEDICATED_PATCH | Freq: Every day | TRANSDERMAL | Status: DC
  Administered 2015-01-24: 21 mg via TRANSDERMAL
  Filled 2015-01-24: qty 1

## 2015-01-24 MED ORDER — VITAMIN B-1 100 MG PO TABS
100.0000 mg | ORAL_TABLET | Freq: Every day | ORAL | Status: DC
  Administered 2015-01-24: 100 mg via ORAL
  Filled 2015-01-24: qty 1

## 2015-01-24 MED ORDER — TRAZODONE HCL 100 MG PO TABS: 100.0000 mg | ORAL_TABLET | Freq: Every day | ORAL | Status: DC

## 2015-01-24 MED ORDER — FLUOXETINE HCL 10 MG PO CAPS
10.0000 mg | ORAL_CAPSULE | Freq: Every day | ORAL | Status: DC
  Administered 2015-01-24: 10 mg via ORAL
  Filled 2015-01-24: qty 1

## 2015-01-24 MED ORDER — CHLORDIAZEPOXIDE HCL 25 MG PO CAPS: 25.0000 mg | ORAL_CAPSULE | Freq: Every day | ORAL | Status: DC

## 2015-01-24 MED ORDER — THIAMINE HCL 100 MG/ML IJ SOLN
100.0000 mg | Freq: Once | INTRAMUSCULAR | Status: DC
Start: 2015-01-24 — End: 2015-01-24

## 2015-01-24 MED ORDER — FOLIC ACID 1 MG PO TABS
1.0000 mg | ORAL_TABLET | Freq: Every day | ORAL | Status: DC
  Administered 2015-01-24: 1 mg via ORAL
  Filled 2015-01-24 (×2): qty 1

## 2015-01-24 MED ORDER — FLUOXETINE HCL 20 MG PO CAPS: 20.0000 mg | ORAL_CAPSULE | Freq: Every day | ORAL | Status: DC

## 2015-01-24 MED ORDER — IBUPROFEN 800 MG PO TABS
800.0000 mg | ORAL_TABLET | Freq: Once | ORAL | Status: AC
  Administered 2015-01-24: 800 mg via ORAL
  Filled 2015-01-24: qty 1

## 2015-01-24 MED ORDER — CHLORDIAZEPOXIDE HCL 25 MG PO CAPS: 25.0000 mg | ORAL_CAPSULE | ORAL | Status: DC

## 2015-01-24 MED ORDER — CHLORDIAZEPOXIDE HCL 25 MG PO CAPS
25.0000 mg | ORAL_CAPSULE | Freq: Four times a day (QID) | ORAL | Status: AC
  Administered 2015-01-24 (×4): 25 mg via ORAL
  Filled 2015-01-24 (×3): qty 1

## 2015-01-24 MED ORDER — TRAZODONE HCL 50 MG PO TABS: 50.0000 mg | ORAL_TABLET | Freq: Every evening | ORAL | Status: DC | PRN

## 2015-01-24 MED ORDER — LORAZEPAM 1 MG PO TABS
0.0000 mg | ORAL_TABLET | Freq: Four times a day (QID) | ORAL | Status: DC
  Administered 2015-01-24: 1 mg via ORAL
  Filled 2015-01-24: qty 1

## 2015-01-24 MED ORDER — LORAZEPAM 2 MG/ML IJ SOLN: 1.0000 mg | Freq: Four times a day (QID) | INTRAMUSCULAR | Status: DC | PRN

## 2015-01-24 MED ORDER — LISINOPRIL 10 MG PO TABS
10.0000 mg | ORAL_TABLET | Freq: Every day | ORAL | Status: DC
  Administered 2015-01-24: 10 mg via ORAL
  Filled 2015-01-24 (×2): qty 1
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
## 2015-01-24 NOTE — BH Assessment (Signed)
[facility] Assessment Progress Note  Per Thedore MinsMojeed Akintayo, MD, this pt requires psychiatric hospitalization at this time. Berneice Heinrichina Tate, RN, [facility] will be available for the pt sometime after 21:00 today. Pt is under IVC and [facility]. Pt's nurse, Kendal Hymendie, has been notified, and agrees to call report to [phone]. Pt is to be transported via [employer].  Doylene Canninghomas Narek Kniss, MA Triage Specialist [phone]

## 2015-01-24 NOTE — ED Notes (Addendum)
Patient states that he drank 2 40 oz beers today and now wants to kill several people and himself. Access to a gun at home. Alert. ETOH. Voluntary at the current time.

## 2015-01-24 NOTE — ED Notes (Addendum)
Pt ambulated from triage to room 32  With nurse and [employer], Gait is abnormal, due to ETOH. Pt is cooperative at this time. Pt is without sitter. Charge is nurse aware. Will hourly round and continue to monitor patient for safety.
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
## 2015-01-24 NOTE — ED Notes (Signed)
Pt reports he feels better, no vomiting or dry heaves noted at present.

## 2015-01-24 NOTE — BH Assessment (Addendum)
Tele Assessment Note   Rodney Hartman is an 55 y.o. male, divorced, African-American who presents unaccompanied to [facility] intoxicated and reporting suicidal and homicidal ideation. Pt is a poor historian due to apparent intoxication (blood alcohol level is pending). He doesn't know how he came to the ED or events earlier this evening. He reports drinking "three beers. " Pt has a history of presenting to the ED very intoxicated. He has a history of inpatient treatment at [facility] for alcohol dependence and mood disorder. He also has history of admission to a medical unit due to delirium tremens. Pt reports he has been depressed. He states he cannot afford prescribed psychiatric medications. He reports current suicidal ideation with plan to jump from a bridge. He reports several family members have attempted suicide and a second cousin who completed suicide. He report homicidal ideation towards "three people... My niece's husband, his friend and a dude across the street." Pt reports he has access to a gun. Pt reports he has experienced hallucinations of "a monkey that runs in front or the window and the television." Pt reports drinking alcohol daily and using marijuana regularly. He cannot provide any details of use. He reports a history of alcohol withdrawal symptoms including nausea, vomiting, diarrhea, sweats, tremors, irritability, blood pressure changes; Pt denies history of seizures but reports a history of blackouts and DT's.. He states his longest period of sobriety is one month.  Pt cannot explain his current living situation. He reports he is depressed because his youngest sister died. He says he has no support. He does have three adult children. He denies any current outpatient providers. He denies any legal problems. Pt reports he has a history of hypertension and is concerned about his blood pressure.  Pt is dressed in hospital scrubs, alert but obviously heavily intoxicated.  He is oriented to person and place but not date or situation. Pt's speech is slurred and with slight psychomotor retardation. Eye contact is fair. Pt's mood is depressed and irritable; affect is congruent with mood. Thought process is coherent and relevant. There is no indication Pt is currently responding to internal stimuli or experiencing delusional thought content. Pt is agreeable to inpatient treatment.   Diagnosis: Alcohol Use Disorder, Severe; Alcohol-induced Mood Disorder  Past Medical History:  Past Medical History  Diagnosis Date  . Hypertension     Past Surgical History  Procedure Laterality Date  . Tonsillectomy    . 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     Family History: No family history on file.  Social History:  reports that he has been smoking Cigarettes.  He has been smoking about 0.50 packs per day. He does not have any smokeless tobacco history on file. He reports that he drinks about 3.6 oz of alcohol per week. He reports that he does not use illicit drugs.  Additional Social History:  Alcohol / Drug Use Pain Medications: None Prescriptions: See MAR Over the Counter: N/A History of alcohol / drug use?: Yes Longest period of sobriety (when/how long): One month Negative Consequences of Use: Financial, Personal relationships Withdrawal Symptoms: DTs, Nausea / Vomiting, Weakness, Patient aware of relationship between substance abuse and physical/medical complications, Cramps, Sweats, Diarrhea Substance #1 Name of Substance 1: ETOH 1 - Age of First Use: Teens  1 - Amount (size/oz): Two 40 oz beers per day 1 -  Frequency: Daily use 1 - Duration: On-gong 1 - Last Use / Amount: 01/23/15, "Three beers"  CIWA: CIWA-Ar BP: (!) 171/137 mmHg (Dr. Bebe Shaggy aware of BP) Pulse Rate: 86 COWS:    PATIENT STRENGTHS: (choose at least two) Average or above average intelligence Capable of  independent living Motivation for treatment/growth  Allergies: No Known Allergies  Home Medications:  (Not in a hospital admission)  OB/GYN Status:  No LMP for male patient.  General Assessment Data Location of Assessment: [facility] TTS Assessment: In system Is this a Tele or Face-to-Face Assessment?: Tele Assessment Is this an Initial Assessment or a Re-assessment for this encounter?: Initial Assessment Marital status: Single Maiden name: NA Is patient pregnant?: No Pregnancy Status: No Living Arrangements: Other (Comment) (Pt unable to say) Can pt return to current living arrangement?: Yes Admission Status: Voluntary Is patient capable of signing voluntary admission?: Yes Referral Source: Self/Family/Friend Insurance type: Self-pay     Crisis Care Plan Living Arrangements: Other (Comment) (Pt unable to say) Legal Guardian: Other: (None) Name of Psychiatrist: None  Name of Therapist: None   Education Status Is patient currently in school?: No Current Grade: NA Highest grade of school patient has completed: NA Name of school: NA Contact person: NA  Risk to self with the past 6 months Suicidal Ideation: Yes-Currently Present Has patient been a risk to self within the past 6 months prior to admission? : Yes Suicidal Intent: Yes-Currently Present Has patient had any suicidal intent within the past 6 months prior to admission? : Yes Is patient at risk for suicide?: Yes Suicidal Plan?: Yes-Currently Present Has patient had any suicidal plan within the past 6 months prior to admission? : Yes Specify Current Suicidal Plan: Pt reports plan to jump from a bridge Access to Means: Yes Specify Access to Suicidal Means: Access to bridge What has been your use of drugs/alcohol within the last 12 months?: Pt reports drinking alcohol and using marijuana Previous Attempts/Gestures: Yes How many times?: 4 (Several) Other Self Harm Risks: Pt appears very intoxicated Triggers for Past  Attempts: Other (Comment) (intoxication) Intentional Self Injurious Behavior: None Family Suicide History: No Recent stressful life event(s): Loss (Comment) (Sister died) Persecutory voices/beliefs?: No Depression: Yes Depression Symptoms: Despondent, Insomnia, Isolating, Guilt, Loss of interest in usual pleasures, Feeling worthless/self pity, Feeling angry/irritable, Fatigue Substance abuse history and/or treatment for substance abuse?: Yes Suicide prevention information given to non-admitted patients: Not applicable  Risk to Others within the past 6 months Homicidal Ideation: Yes-Currently Present Does patient have any lifetime risk of violence toward others beyond the six months prior to admission? : Unknown Thoughts of Harm to Others: Yes-Currently Present Comment - Thoughts of Harm to Others: Pt reports three people he wants to harm Current Homicidal Intent: No Current Homicidal Plan: Yes-Currently Present Describe Current Homicidal Plan: Shoot people Access to Homicidal Means: Yes Describe Access to Homicidal Means: Pt reports he has access to a gun Identified Victim: "Niece's husband, his friend, dude across the street" History of harm to others?: No (Unknown) Assessment of Violence: None Noted Violent Behavior Description: Pt makes threats to kill people when intoxicated Does patient have access to weapons?: Yes (Comment) Criminal Charges Pending?: No Does patient have a court date: No Is patient on probation?: No  Psychosis Hallucinations: Visual (Reports seeing monkey) Delusions: None noted  Mental Status Report Appearance/Hygiene: Disheveled, In scrubs Eye Contact: Fair Motor Activity: Psychomotor retardation Speech: Slow, Slurred Level of Consciousness: Irritable, Other (Comment) (Intoxicated) Mood: Depressed, Irritable Affect: Irritable  Anxiety Level: Minimal Thought Processes: Coherent Judgement: Impaired Orientation: Person, Place Obsessive Compulsive  Thoughts/Behaviors: None  Cognitive Functioning Concentration: Decreased Memory: Recent Impaired, Remote Intact IQ: Average Insight: Poor Impulse Control: Poor Appetite: Fair Weight Loss: 0 Weight Gain: 0 Sleep: Decreased Total Hours of Sleep: 4 (Unknown) Vegetative Symptoms: Decreased grooming  ADLScreening [facility]) Patient's cognitive ability adequate to safely complete daily activities?: Yes Patient able to express need for assistance with ADLs?: Yes Independently performs ADLs?: Yes (appropriate for developmental age)  Prior Inpatient Therapy Prior Inpatient Therapy: Yes Prior Therapy Dates: Multiple admits Prior Therapy Facilty/Provider(s): [facility], [facility] Reason for Treatment: Depression, substance abuse  Prior Outpatient Therapy Prior Outpatient Therapy: No Prior Therapy Dates: N/A Prior Therapy Facilty/Provider(s): N/A Reason for Treatment: N/A Does patient have an ACCT team?: No Does patient have Intensive In-House Services?  : No Does patient have [facility] services? : No Does patient have P4CC services?: No  ADL Screening (condition at time of admission) Patient's cognitive ability adequate to safely complete daily activities?: Yes Is the patient deaf or have difficulty hearing?: No Does the patient have difficulty seeing, even when wearing glasses/contacts?: No Does the patient have difficulty concentrating, remembering, or making decisions?: No Patient able to express need for assistance with ADLs?: Yes Does the patient have difficulty dressing or bathing?: No Independently performs ADLs?: Yes (appropriate for developmental age) Does the patient have difficulty walking or climbing stairs?: No Weakness of Legs: None Weakness of Arms/Hands: None       Abuse/Neglect Assessment (Assessment to be complete while patient is alone) Physical Abuse: Denies Verbal Abuse: Denies Sexual Abuse: Denies Exploitation of patient/patient's  resources: Denies Self-Neglect: Denies     Merchant navy officerAdvance Directives (For Healthcare) Does patient have an advance directive?: No Would patient like information on creating an advanced directive?: No - patient declined information    Additional Information 1:1 In Past 12 Months?: No CIRT Risk: No Elopement Risk: No Does patient have medical clearance?: No     Disposition: Binnie RailJoann Glover, [facility], confirms adult unit is currently at capacity. Gave clinical report to Hulan FessIjeoma Nwaeze, NP who said Pt meets criteria for inpatient dual-diagnosis treatment when he is medically cleared. Notified Dr. Zadie Rhineonald Wickline and Guy Francoravia Benjamin, RN of recommendation.  Disposition Initial Assessment Completed for this Encounter: Yes Disposition of Patient: Other dispositions Type of inpatient treatment program: Adult Other disposition(s): Other (Comment)   Pamalee LeydenFord [name] Clevland Cork Jr, [facility], [facility], [facility] Triage Specialist [phone]   Patsy BaltimoreWarrick Jr, Harlin RainFord [name] 01/24/2015 4:18 AM

## 2015-01-24 NOTE — ED Provider Notes (Signed)
CSN: [id number]     Arrival date & time 01/24/15  0141 History  By signing my name below, I, Rodney Hartman, attest that this documentation has been prepared under the direction and in the presence of Zadie Rhineonald Tharon Kitch, MD. Electronically Signed: Gonzella LexKimberly Bianca Hartman, Scribe. 01/24/2015. 2:53 AM.   Chief Complaint  Patient presents with  . Suicidal  LEVEL 5 CAVEAT DUE TO PSYCHIATRIC DISORDER  Patient is a 55 y.o. male presenting with alcohol problem. The history is provided by the patient. No language interpreter was used.  Alcohol Problem This is a new problem. The current episode started 12 to 24 hours ago. The problem occurs constantly. The problem has not changed since onset.Nothing aggravates the symptoms. Nothing relieves the symptoms. He has tried nothing for the symptoms.    HPI Comments: Sena HitchWillie N Sjogren is a 55 y.o. male who presents to the Emergency Department complaining of suicidal and homicidal thoughts after drinking 2 40 oz beers today.   During the exam he became combative and tried to swing at me during interview.  Past Medical History  Diagnosis Date  . Hypertension    Past Surgical History  Procedure Laterality Date  . Tonsillectomy    . 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    No family history on file. Social History  Substance Use Topics  . Smoking status: Current Some Day Smoker -- 0.50 packs/day    Types: Cigarettes  . Smokeless tobacco: Not on file  . Alcohol Use: 3.6 oz/week    6 Cans of beer per week     Comment: every other day     Review of Systems  Unable to perform ROS: Psychiatric disorder  All other systems reviewed and are negative.   Allergies  Review of patient's allergies indicates no known allergies.  Home Medications   Prior to Admission medications   Medication Sig Start Date End Date Taking? Authorizing Provider  acetaminophen (TYLENOL) 325 MG  tablet Take 2 tablets (650 mg total) by mouth every 6 (six) hours as needed for moderate pain. Patient not taking: Reported on 01/24/2015 12/22/14   Rolly SalterPranav M Patel, MD  benztropine (COGENTIN) 0.5 MG tablet Take 1 tablet (0.5 mg total) by mouth at bedtime. Patient not taking: Reported on 01/02/2015 12/22/14   Rolly SalterPranav M Patel, MD  FLUoxetine (PROZAC) 10 MG capsule Take 1 capsule (10 mg total) by mouth daily. Patient not taking: Reported on 01/02/2015 08/12/14   Beau FannyJohn C Withrow, FNP  FLUoxetine (PROZAC) 20 MG capsule Take 1 capsule (20 mg total) by mouth daily. Patient not taking: Reported on 01/02/2015 12/22/14   Rolly SalterPranav M Patel, MD  folic acid (FOLVITE) 1 MG tablet Take 1 tablet (1 mg total) by mouth daily. Patient not taking: Reported on 01/02/2015 12/22/14   Rolly SalterPranav M Patel, MD  haloperidol (HALDOL) 2 MG tablet Take 1 tablet (2 mg total) by mouth at bedtime. Patient not taking: Reported on 01/02/2015 12/22/14   Rolly SalterPranav M Patel, MD  hydrOXYzine (ATARAX/VISTARIL) 25 MG tablet Take 1 tablet (25 mg total) by mouth 3 (three) times daily as needed for anxiety. Patient not taking: Reported on 01/02/2015 08/12/14   Beau FannyJohn C Withrow, FNP  lisinopril (PRINIVIL,ZESTRIL) 10 MG tablet Take 1 tablet (10 mg total) by mouth daily. Patient not taking: Reported on 01/02/2015 12/22/14   Rolly SalterPranav M Patel, MD  nicotine (NICODERM CQ - DOSED IN MG/24  HOURS) 21 mg/24hr patch Place 1 patch (21 mg total) onto the skin daily. Patient not taking: Reported on 01/02/2015 12/22/14   Rolly Salter, MD  oxyCODONE-acetaminophen (PERCOCET) 10-325 MG tablet Take 0.5-1 tablets by mouth every 6 (six) hours as needed for pain. Patient not taking: Reported on 01/02/2015 11/30/14   [name], PA-C  simethicone (MYLICON) 80 MG chewable tablet Chew 1 tablet (80 mg total) by mouth 4 (four) times daily. Patient not taking: Reported on 01/02/2015 12/22/14   Rolly Salter, MD  thiamine 100 MG tablet Take 1 tablet (100 mg total) by mouth  daily. Patient not taking: Reported on 01/02/2015 12/23/14   Rolly Salter, MD  traZODone (DESYREL) 50 MG tablet Take 1 tablet (50 mg total) by mouth at bedtime as needed for sleep. Patient not taking: Reported on 01/02/2015 12/22/14   Rolly Salter, MD   BP 156/105 mmHg  Pulse 93  Temp(Src) 97.9 F (36.6 C) (Oral)  SpO2 99% Physical Exam CONSTITUTIONAL: disheveled, smells of alcohol  HEAD: Normocephalic/atraumatic EYES: EOMI ENMT: Mucous membranes moist NECK: supple no meningeal signs SPINE/BACK:entire spine nontender CV: S1/S2 noted, no murmurs/rubs/gallops noted LUNGS: Lungs are clear to auscultation bilaterally, no apparent distress ABDOMEN: soft, nontender, no rebound or guarding, bowel sounds noted throughout abdomen NEURO: Pt is awake/alert, moves all extremitiesx4.  EXTREMITIES: pulses normal/equal, full ROM SKIN: warm, color normal PSYCH: anxious   ED Course  Procedures  DIAGNOSTIC STUDIES:    Oxygen Saturation is 99% on RA, normal by my interpretation.   COORDINATION OF CARE:  2:48 AM Will review labs. Will monitor in the ED. Discussed treatment plan with pt at bedside and pt agreed to plan.   Pt was initially combative, IVC Was completed as he was intoxicated and threatening harm Home meds ordered He was initially hypertensive but this is improved D/w psych and they recommend inpatient He is noted to be mildly hypernatremic, mildly worsened from prior He is taking PO fluids I have discussed this abnormality with triad hospitalist for recommendations As long as patient is taking PO fluids without vomiting, this should improve Will advise to push PO fluids while awaiting psych admission   Labs Review Labs Reviewed  ETHANOL - Abnormal; Notable for the following:    Alcohol, Ethyl (B) 447 (*)    All other components within normal limits  ACETAMINOPHEN LEVEL - Abnormal; Notable for the following:    Acetaminophen (Tylenol), Serum <10 (*)    All other  components within normal limits  CBC - Abnormal; Notable for the following:    WBC 3.2 (*)    All other components within normal limits  COMPREHENSIVE METABOLIC PANEL - Abnormal; Notable for the following:    Sodium 150 (*)    BUN <5 (*)    AST 46 (*)    All other components within normal limits  SALICYLATE LEVEL  URINE RAPID DRUG SCREEN, HOSP PERFORMED   I have personally reviewed and evaluated these lab results as part of my medical decision-making.  MDM   Final diagnoses:  Suicidal ideation  Dehydration  Alcohol abuse    Nursing notes including past medical history and social history reviewed and considered in documentation Labs/vital reviewed myself and considered during evaluation   I personally performed the services described in this documentation, which was scribed in my presence. The recorded information has been reviewed and is accurate.      Zadie Rhine, MD 01/24/15 [phone]

## 2015-01-24 NOTE — ED Notes (Signed)
TTS consult in progress. °

## 2015-01-24 NOTE — ED Notes (Signed)
Pt vomited 100 ccs of gatorade returns.  Remains pending observation until [facility]

## 2015-01-24 NOTE — ED Notes (Signed)
Pt oriented to room and unit.  Pt is very withdrawn with flat affect.  Pt states he is suicidal although he does contracts for safety.  Pt states that he hears voices telling him to hurt himself.  Pt reassured of his safety.  15 minute checks and video monitoring in place.

## 2015-01-24 NOTE — ED Notes (Signed)
Pt has been seen and wanded by security.  Pt has 2 bags. 

## 2015-01-24 NOTE — ED Notes (Signed)
Report called to RN Apolinar JunesBrandon, [employer] transport requested. Pt tolerating sips of Gatorade.

## 2015-01-24 NOTE — ED Notes (Signed)
Pt AAO x 3, watching TV at present, no distress noted, calm & cooperative.  Pending [facility] transfer after 9pm.  Monitoring for safety, Q 15 min checks in effect.

## 2015-01-25 ENCOUNTER — Encounter (HOSPITAL_COMMUNITY): Payer: Self-pay | Admitting: *Deleted

## 2015-01-25 DIAGNOSIS — R45851 Suicidal ideations: Secondary | ICD-10-CM

## 2015-01-25 DIAGNOSIS — F10229 Alcohol dependence with intoxication, unspecified: Principal | ICD-10-CM

## 2015-01-25 DIAGNOSIS — F3289 Other specified depressive episodes: Secondary | ICD-10-CM

## 2015-01-25 DIAGNOSIS — F1014 Alcohol abuse with alcohol-induced mood disorder: Secondary | ICD-10-CM | POA: Diagnosis present

## 2015-01-25 DIAGNOSIS — F1024 Alcohol dependence with alcohol-induced mood disorder: Secondary | ICD-10-CM

## 2015-01-25 DIAGNOSIS — F333 Major depressive disorder, recurrent, severe with psychotic symptoms: Secondary | ICD-10-CM

## 2015-01-25 MED ORDER — LORAZEPAM 1 MG PO TABS
1.0000 mg | ORAL_TABLET | Freq: Three times a day (TID) | ORAL | Status: AC
  Administered 2015-01-26 – 2015-01-27 (×2): 1 mg via ORAL
  Filled 2015-01-25 (×3): qty 1

## 2015-01-25 MED ORDER — MAGNESIUM HYDROXIDE 400 MG/5ML PO SUSP: 30.0000 mL | Freq: Every day | ORAL | Status: DC | PRN

## 2015-01-25 MED ORDER — ONDANSETRON 4 MG PO TBDP
4.0000 mg | ORAL_TABLET | Freq: Four times a day (QID) | ORAL | Status: AC | PRN
  Administered 2015-01-25: 4 mg via ORAL
  Filled 2015-01-25: qty 1

## 2015-01-25 MED ORDER — ACETAMINOPHEN 325 MG PO TABS
650.0000 mg | ORAL_TABLET | Freq: Four times a day (QID) | ORAL | Status: DC | PRN
Start: 2015-01-25 — End: 2015-01-28
  Administered 2015-01-25 – 2015-01-26 (×4): 650 mg via ORAL
  Filled 2015-01-25 (×4): qty 2

## 2015-01-25 MED ORDER — IBUPROFEN 600 MG PO TABS
600.0000 mg | ORAL_TABLET | Freq: Four times a day (QID) | ORAL | Status: DC | PRN
  Administered 2015-01-25 – 2015-01-26 (×2): 600 mg via ORAL
  Filled 2015-01-25 (×3): qty 1

## 2015-01-25 MED ORDER — LORAZEPAM 1 MG PO TABS: 1.0000 mg | ORAL_TABLET | Freq: Every day | ORAL | Status: DC

## 2015-01-25 MED ORDER — HYDROXYZINE HCL 25 MG PO TABS
25.0000 mg | ORAL_TABLET | Freq: Four times a day (QID) | ORAL | Status: DC | PRN
  Administered 2015-01-25 – 2015-01-27 (×2): 25 mg via ORAL
  Filled 2015-01-25 (×2): qty 1

## 2015-01-25 MED ORDER — NICOTINE 21 MG/24HR TD PT24
21.0000 mg | MEDICATED_PATCH | Freq: Every day | TRANSDERMAL | Status: DC
  Administered 2015-01-25 – 2015-01-28 (×4): 21 mg via TRANSDERMAL
  Filled 2015-01-25 (×4): qty 1
  Filled 2015-01-25: qty 14
  Filled 2015-01-25: qty 1

## 2015-01-25 MED ORDER — LORAZEPAM 1 MG PO TABS
1.0000 mg | ORAL_TABLET | Freq: Four times a day (QID) | ORAL | Status: AC | PRN
  Administered 2015-01-26: 1 mg via ORAL
  Filled 2015-01-25: qty 1

## 2015-01-25 MED ORDER — LORAZEPAM 1 MG PO TABS
1.0000 mg | ORAL_TABLET | Freq: Four times a day (QID) | ORAL | Status: AC
  Administered 2015-01-25 – 2015-01-26 (×6): 1 mg via ORAL
  Filled 2015-01-25 (×6): qty 1

## 2015-01-25 MED ORDER — ADULT MULTIVITAMIN W/MINERALS CH
1.0000 | ORAL_TABLET | Freq: Every day | ORAL | Status: DC
  Administered 2015-01-25 – 2015-01-28 (×4): 1 via ORAL
  Filled 2015-01-25 (×6): qty 1

## 2015-01-25 MED ORDER — ALUM & MAG HYDROXIDE-SIMETH 200-200-20 MG/5ML PO SUSP
30.0000 mL | ORAL | Status: DC | PRN
Start: 2015-01-25 — End: 2015-01-28

## 2015-01-25 MED ORDER — TRAZODONE HCL 50 MG PO TABS
50.0000 mg | ORAL_TABLET | Freq: Every evening | ORAL | Status: DC | PRN
  Administered 2015-01-25 – 2015-01-26 (×2): 50 mg via ORAL
  Filled 2015-01-25 (×9): qty 1

## 2015-01-25 MED ORDER — LORAZEPAM 1 MG PO TABS
1.0000 mg | ORAL_TABLET | Freq: Two times a day (BID) | ORAL | Status: AC
  Administered 2015-01-27 – 2015-01-28 (×2): 1 mg via ORAL
  Filled 2015-01-25 (×2): qty 1

## 2015-01-25 MED ORDER — LISINOPRIL 10 MG PO TABS
10.0000 mg | ORAL_TABLET | Freq: Every day | ORAL | Status: DC
  Administered 2015-01-25 – 2015-01-28 (×4): 10 mg via ORAL
  Filled 2015-01-25 (×4): qty 1
  Filled 2015-01-25: qty 14
  Filled 2015-01-25: qty 1

## 2015-01-25 MED ORDER — FOLIC ACID 1 MG PO TABS
1.0000 mg | ORAL_TABLET | Freq: Every day | ORAL | Status: DC
  Administered 2015-01-25 – 2015-01-28 (×4): 1 mg via ORAL
  Filled 2015-01-25: qty 1
  Filled 2015-01-25: qty 14
  Filled 2015-01-25 (×4): qty 1

## 2015-01-25 MED ORDER — ENSURE ENLIVE PO LIQD
237.0000 mL | Freq: Two times a day (BID) | ORAL | Status: DC
  Administered 2015-01-25: 237 mL via ORAL

## 2015-01-25 MED ORDER — VITAMIN B-1 100 MG PO TABS
100.0000 mg | ORAL_TABLET | Freq: Every day | ORAL | Status: DC
  Administered 2015-01-25 – 2015-01-28 (×4): 100 mg via ORAL
  Filled 2015-01-25 (×2): qty 1
  Filled 2015-01-25: qty 14
  Filled 2015-01-25 (×3): qty 1

## 2015-01-25 MED ORDER — LOPERAMIDE HCL 2 MG PO CAPS: 2.0000 mg | ORAL_CAPSULE | ORAL | Status: AC | PRN

## 2015-01-25 NOTE — Progress Notes (Signed)
Recreation Therapy Notes  Animal-Assisted Activity (AAA) Program Checklist/Progress Notes PAnimal-Assisted Activity (AAA) Program Checklist/Progress Notes Patient Eligibility Criteria Checklist & Daily Group note for Rec Tx Intervention  Date: 01.17.2017 Time: 2:45pm Location: [address]    AAA/T Program Assumption of Risk Form signed by Patient/ or Parent Legal Guardian NO Patient refused to sign consent form at admission.   Behavioral Response: Did not attend   Jearl Klinefelter, LRT/CTRS  Jearl Klinefelter 01/25/2015 2:51 PM

## 2015-01-25 NOTE — Progress Notes (Signed)
NUTRITION ASSESSMENT  Pt identified as at risk on the Malnutrition Screen Tool  INTERVENTION: 1. Educated patient on the importance of nutrition and encouraged intake of food and beverages. 2. Discussed weight goals. 3. Supplements:  Continue Ensure Enlive po BID, each supplement provides 350 kcal and 20 grams of protein   NUTRITION DIAGNOSIS: No nutrition dx at this time.   Goal: Pt to meet >/= 90% of their estimated nutrition needs.  Monitor:  PO intake  Assessment:  Pt seen for MST. He was admitted for substance abuse, SI, and HI. Per report, pt drinks three 40 ounce beers/day. Pt had previously been losing weight but in the past 6 months he has been gaining weight. He lost 21 lbs (11% body weight) from March 2016-July 2016 (4 months) which is significant for time frame. Since July he has gained 10 lbs. Ensure Enlive order already in place.  55 y.o. male  Height: Ht Readings from Last 1 Encounters:  01/25/15 5' 10.5" (1.791 m)    Weight: Wt Readings from Last 1 Encounters:  01/25/15 182 lb (82.555 kg)    Weight Hx: Wt Readings from Last 10 Encounters:  01/25/15 182 lb (82.555 kg)  12/22/14 182 lb 8 oz (82.781 kg)  11/26/14 182 lb 15.7 oz (83 kg)  08/07/14 172 lb (78.019 kg)  08/06/14 177 lb (80.287 kg)  04/05/14 198 lb (89.812 kg)  06/04/13 210 lb (95.255 kg)    BMI:  Body mass index is 25.74 kg/(m^2). Pt meets criteria for overweight based on current BMI.  Estimated Nutritional Needs: Kcal: 25-30 kcal/kg Protein: > 1 gram protein/kg Fluid: 1 ml/kcal  Diet Order: Diet regular Room service appropriate?: Yes; Fluid consistency:: Thin Pt is also offered choice of unit snacks mid-morning and mid-afternoon.  Pt is eating as desired.   Lab results and medications reviewed.      Trenton Gammon, RD, LDN Inpatient Clinical Dietitian Pager # [phone] After hours/weekend pager # [phone]

## 2015-01-25 NOTE — [facility] Group Notes (Signed)
Pt stated his day was better than yesterday.  His goal was to mingle more.  He doesn't not have any discharge plans yet.  Caroll Rancher, MHT

## 2015-01-25 NOTE — Progress Notes (Signed)
D: Patient sitting in the dayroom quietly.  Patient states his day was ok but states he has had some passive SI.  Patient appears flat and depressed.  Patient is irritable with writer on approach but does become less irritable after speaking with Clinical research associate.  Patient states he is passive SI but verbally contracts for safety.  Patient denies HI and denies AVH. A: Staff to monitor Q 15 mins for safety.  Encouragement and support offered.  Scheduled medications administered per orders.  Ibuprofen administered prn for pain. R: Patient remains safe on the unit.  Patient attended group tonight. Patient visible on the unit but does not appear to be interacting with peers.  Patient taking administered medications.
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
## 2015-01-25 NOTE — Tx Team (Signed)
Initial Interdisciplinary Treatment Plan   PATIENT STRESSORS: Financial difficulties Loss of sister (pt reports sister died of cancer 2 weeks ago) Medication change or noncompliance Occupational concerns Substance abuse   PATIENT STRENGTHS: Average or above average intelligence Capable of independent living Communication skills General fund of knowledge   PROBLEM LIST: Problem List/Patient Goals Date to be addressed Date deferred Reason deferred Estimated date of resolution  "quit drinking"  01/25/15     "that should be about it"  01/25/15           SI 01/25/15     HI 01/25/15     depression 01/25/15                        DISCHARGE CRITERIA:  Improved stabilization in mood, thinking, and/or behavior Withdrawal symptoms are absent or subacute and managed without 24-hour nursing intervention  PRELIMINARY DISCHARGE PLAN: Attend 12-step recovery group Outpatient therapy  PATIENT/FAMIILY INVOLVEMENT: This treatment plan has been presented to and reviewed with the patient, Rodney Hartman.  The patient and family have been given the opportunity to ask questions and make suggestions.  [name] 01/25/2015, 12:48 AM

## 2015-01-25 NOTE — [facility] Group Notes (Signed)
[facility] LCSW Group Therapy 01/25/2015  1:15 PM   Type of Therapy: Group Therapy  Participation Level: Did Not Attend. Patient invited to participate but declined.   Samuella Bruin, MSW, [employer] Clinical Social Worker [facility] [phone]

## 2015-01-25 NOTE — Plan of Care (Signed)
Problem: Consults Goal: Depression Patient Education See Patient Education Module for education specifics.  Outcome: Progressing Nurse discussed depression/coping skills with patient.        

## 2015-01-25 NOTE — Progress Notes (Signed)
D:  Patient's self inventory sheet, patient has fair sleep, sleep medication is helpful.  Fair appetite, low energy level, poor concentration.  Rated depression, hopeless and anxiety 8.  Withdrawals, chilling, nausea, runny nose.  Denied SI.  Has experienced pain, dizziness, headaches.   Has experienced pain in past 24 hours, side, back, pain medication is helpful.  Goal is to get stronger.  Plans to rest today.  No discharge plans. A:  Medications administered per MD orders.  Emotional support and encouragement given patient. R:  Denied  SI and HI, contracts for safety.  Denied A/V hallucinations.  Safety maintained with 15 minute checks.
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
## 2015-01-25 NOTE — [facility] Group Notes (Signed)
The focus of this group is to educate the patient on the purpose and policies of crisis stabilization and provide a format to answer questions about their admission.  The group details unit policies and expectations of patients while admitted.  Patient did not attend 0900 nurse education orientation group this morning.  Patient stayed in bed.   
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
## 2015-01-26 NOTE — Progress Notes (Signed)
Patient has been isolative to room, resting in bed. Affect flat, mood depressed. Rating depression at an 8/10, hopelessness at a 4/10 and anxiety 5/10. Goal is to "work on my attitude." Patient forwards minimal information however does indicate feeling "somewhat better." Reports back pain at a 9/10. Medicated per orders, tylenol prn given. Emotional support offered. Fall precautions reviewed and in place. Patient verbalizes understanding. Endorsing passive SI however verbally contracts for safety. Denies HI. Patient remains safe on level III obs. [place]

## 2015-01-26 NOTE — Progress Notes (Signed)
Recreation Therapy Notes  Date: 01.18.2017 Time: 9:30am Location: [address] Dayroom   Group Topic: Stress Management  Goal Area(s) Addresses:  Patient will actively participate in stress management techniques presented during session.   Behavioral Response: Did not attend.    Marykay Lex [place]ck [place], LRT/CTRS        Odalis Jordan L 01/26/2015 11:52 AM

## 2015-01-26 NOTE — [facility] Group Notes (Signed)
   [facility] LCSW Aftercare Discharge Planning Group Note  01/26/2015  8:45 AM   Participation Quality: Alert, Appropriate and Oriented  Mood/Affect: Depressed and Flat  Depression Rating:  2  Anxiety Rating: 3  Thoughts of Suicide: Pt denies SI/HI  Will you contract for safety? Yes  Current AVH: Pt denies  Plan for Discharge/Comments: Pt attended discharge planning group and actively participated in group. CSW provided pt with today's workbook. Patient reports feeling "better than yesterday" but endorses experiencing pain issues. He continues to be interested in residential treatment at [facility].  Transportation Means: Pt denies access to transportation  Supports: No supports mentioned at this time  Samuella Bruin, MSW, [employer] Clinical Social Worker [employer] [phone]

## 2015-01-26 NOTE — Progress Notes (Signed)
Pt attended NA group this evening.  

## 2015-01-26 NOTE — [facility] Group Notes (Signed)
[facility] LCSW Group Therapy 01/26/2015  1:15 PM Type of Therapy: Group Therapy Participation Level: Minimal  Participation Quality: Attentive  Affect: Depressed and Flat  Cognitive: Alert and Oriented  Insight: Developing/Improving and Engaged  Engagement in Therapy: Developing/Improving and Engaged  Modes of Intervention: Clarification, Confrontation, Discussion, Education, Exploration, Limit-setting, Orientation, Problem-solving, Rapport Building, Dance movement psychotherapist, Socialization and Support  Summary of Progress/Problems: The topic for group today was emotional regulation. This group focused on both positive and negative emotion identification and allowed group members to process ways to identify feelings, regulate negative emotions, and find healthy ways to manage internal/external emotions. Group members were asked to reflect on a time when their reaction to an emotion led to a negative outcome and explored how alternative responses using emotion regulation would have benefited them. Group members were also asked to discuss a time when emotion regulation was utilized when a negative emotion was experienced. Patient discussed how his mother is the most loving person that he knows as she has unconditional love and support for him. He also states that he finds unnecessary conflict frustrating. Patient participated minimally in discussion but was observed to be listing attentively.   Samuella Bruin, MSW, [employer] Clinical Social Worker [facility] [phone]

## 2015-01-26 NOTE — Progress Notes (Signed)
[facility] and [facility] referrals made at patient's request.   Samuella Bruin, MSW, [facility] Clinical Social Worker [facility] [place] [phone]

## 2015-01-26 NOTE — Tx Team (Addendum)
Interdisciplinary Treatment Plan Update (Adult) Date: 01/26/2015    Time Reviewed: 9:30 AM  Progress in Treatment: Attending groups: Minimally Participating in groups: Minimally Taking medication as prescribed: Yes Tolerating medication: Yes Family/Significant other contact made: No, patient has declined collateral contact Patient understands diagnosis: Yes Discussing patient identified problems/goals with staff: Yes Medical problems stabilized or resolved: Yes Denies suicidal/homicidal ideation: Yes Issues/concerns per patient self-inventory: Yes Other:  New problem(s) identified: N/A  Discharge Plan or Barriers: Patient plans to discharge to [facility] for continued treatment.  Reason for Continuation of Hospitalization:  Depression Anxiety Medication Stabilization   Comments: N/A  Estimated length of stay: Discharge anticipated for 01/28/15    Rodney Hartman is a 55yo male admitted under IVC with Substance Induced Mood Disorder presenting with visual hallucinations, suicidal ideation, homicidal ideation, and alcohol abuse. Pt reports primary trigger(s) for admission were financial and social stressors. Patient will benefit from crisis stabilization, medication evaluation, group therapy and psycho education in addition to case management for discharge planning. At discharge, it is recommended that Pt remain compliant with established discharge plan and continued treatment.   Review of initial/current patient goals per problem list:  1. Goal(s): Patient will participate in aftercare plan   Met: Yes   Target date: 3-5 days post admission date   As evidenced by: Patient will participate within aftercare plan AEB aftercare provider and housing plan at discharge being identified.  1/18: [facility] referrals pending.   1/20: Goal met. Patient plans to go to [facility] for continued treatment.   2. Goal (s): Patient will exhibit decreased depressive symptoms and suicidal  ideations.   Met: Yes   Target date: 3-5 days post admission date   As evidenced by: Patient will utilize self rating of depression at 3 or below and demonstrate decreased signs of depression or be deemed stable for discharge by MD.  1/18: Goal met. Patient rates depression at 2 today.   3. Goal(s): Patient will demonstrate decreased signs and symptoms of anxiety.   Met: Yes   Target date: 3-5 days post admission date   As evidenced by: Patient will utilize self rating of anxiety at 3 or below and demonstrated decreased signs of anxiety, or be deemed stable for discharge by MD  1/18: Goal met. Patient rates anxiety at 3 today.    4. Goal(s): Patient will demonstrate decreased signs of withdrawal due to substance abuse   Met: Yes   Target date: 3-5 days post admission date   As evidenced by: Patient will produce a CIWA/COWS score of 0, have stable vitals signs, and no symptoms of withdrawal  1/18: Goal met. No withdrawal symptoms reported at this time.    5. Goal(s): Patient will demonstrate decreased signs of psychosis  * Met: Goal met.  * Target date: 3-5 days post admission date  * As evidenced by: Patient will demonstrate decreased frequency of AVH or return to baseline function   1/18: Goal not met: Pt to take medication as prescribed to decrease psychosis to baseline.   1/20: Goal met. Patient at baseline symptoms.     Attendees: Patient:    Family:    Physician: Dr. Parke Poisson; Dr. Sabra Heck 01/26/2015 9:30 AM  Nursing: Franco Nones, [address] Loletta Specter, Ottie Glazier, RN 01/26/2015 9:30 AM  Clinical Social Worker: Tilden Fossa, LCSWA 01/26/2015 9:30 AM  Other: Peri Maris, LCSWA; [place], LCSW  01/26/2015 9:30 AM  Other: Norberto Sorenson, P4CC 01/26/2015 9:30 AM  Other: Lars Pinks, Case Manager 01/26/2015 9:30  AM  Other:  Agustina Caroli, May Augstin, NP 01/26/2015 9:30 AM  Other:    Other:      Scribe for Treatment Team:  Tilden Fossa, MSW, [place]

## 2015-01-26 NOTE — [facility] Suicide Risk Assessment (Signed)
[facility] INPATIENT:  Family/Significant Other Suicide Prevention Education  Suicide Prevention Education:  Patient Refusal for Family/Significant Other Suicide Prevention Education: The patient Rodney Hartman has refused to provide written consent for family/significant other to be provided Family/Significant Other Suicide Prevention Education during admission and/or prior to discharge.  Physician notified. SPE reviewed with patient and brochure provided. Patient encouraged to return to hospital if having suicidal thoughts, patient verbalized his/her understanding and has no further questions at this time.   Chamya Hunton, [place] 01/26/2015, 8:14 AM
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
## 2015-01-26 NOTE — Progress Notes (Signed)
D:Patient in the dayroom on approach.  Patient does appear flat and depressed but does engage in conversation with Buyer, retail.  Patient states his plan is to go to a program when discharged.  Patient states he has family but they are not supportive.  Patient denies SI/HI and denies AVH. A: Staff to monitor Q 15 mins for safety.  Encouragement and support offered.  Scheduled medications administered per orders.  Ibuprofen administered prn for pain. R: Patient remains safe on the unit.  Patient attended group tonight.  Patient visible on the unit does not appear to be interacting with peers.  Patient taking administered medications.

## 2015-01-27 MED ORDER — TRAZODONE HCL 100 MG PO TABS
100.0000 mg | ORAL_TABLET | Freq: Every evening | ORAL | Status: DC | PRN
  Administered 2015-01-27: 100 mg via ORAL
  Filled 2015-01-27: qty 1
  Filled 2015-01-27: qty 28
  Filled 2015-01-27 (×4): qty 1
  Filled 2015-01-27: qty 28

## 2015-01-27 MED ORDER — MAGNESIUM CITRATE PO SOLN
1.0000 | Freq: Once | ORAL | Status: AC
  Administered 2015-01-27: 1 via ORAL

## 2015-01-27 MED ORDER — LIDOCAINE 5 % EX PTCH
1.0000 | MEDICATED_PATCH | Freq: Every day | CUTANEOUS | Status: DC
  Administered 2015-01-27 – 2015-01-28 (×2): 1 via TRANSDERMAL
  Filled 2015-01-27: qty 14
  Filled 2015-01-27 (×2): qty 1
  Filled 2015-01-27: qty 14
  Filled 2015-01-27 (×2): qty 1

## 2015-01-27 MED ORDER — GABAPENTIN 100 MG PO CAPS
100.0000 mg | ORAL_CAPSULE | Freq: Three times a day (TID) | ORAL | Status: DC
  Administered 2015-01-27 – 2015-01-28 (×3): 100 mg via ORAL
  Filled 2015-01-27: qty 1
  Filled 2015-01-27: qty 42
  Filled 2015-01-27 (×3): qty 1
  Filled 2015-01-27: qty 42
  Filled 2015-01-27: qty 1
  Filled 2015-01-27: qty 42

## 2015-01-27 MED ORDER — IBUPROFEN 800 MG PO TABS
800.0000 mg | ORAL_TABLET | Freq: Four times a day (QID) | ORAL | Status: DC | PRN
  Administered 2015-01-27: 800 mg via ORAL
  Filled 2015-01-27: qty 1

## 2015-01-27 MED ORDER — DOCUSATE SODIUM 100 MG PO CAPS
100.0000 mg | ORAL_CAPSULE | Freq: Two times a day (BID) | ORAL | Status: DC
  Administered 2015-01-27 – 2015-01-28 (×2): 100 mg via ORAL
  Filled 2015-01-27 (×4): qty 1
  Filled 2015-01-27 (×2): qty 28

## 2015-01-27 MED ORDER — DULOXETINE HCL 20 MG PO CPEP
20.0000 mg | ORAL_CAPSULE | Freq: Every day | ORAL | Status: DC
  Administered 2015-01-27 – 2015-01-28 (×2): 20 mg via ORAL
  Filled 2015-01-27 (×4): qty 1
  Filled 2015-01-27: qty 14

## 2015-01-27 NOTE — Progress Notes (Signed)
D: Patient in his room on approach.  Patient states he had a much better day today.  Patient states he is better able to focus himself due to there being no distractions.  Patient denies SI/HI and denies AVH tonight.  Patient does appear flat and depressed but his affect brightens when speaking to Clinical research associate.   A: Staff to monitor Q 15 mins for safety.  Encouragement and support offered.  Scheduled medications administered per orders.  Vistaril and Ibuprofen administered prn tonight.   R: Patient remains safe on the unit.  Patient attended group tonight.  Patient visible on the unit and interacting with peers.

## 2015-01-27 NOTE — [facility] Group Notes (Signed)
[facility] LCSW Group Therapy 01/27/2015  1:15 PM   Type of Therapy: Group Therapy  Participation Level: Did Not Attend. Patient invited to participate but declined.   Samuella Bruin, MSW, [employer] Clinical Social Worker [facility] [phone]

## 2015-01-27 NOTE — Progress Notes (Signed)
Patient ID: Rodney Hartman, male   DOB: 1960-02-26, 55 y.o.   MRN: [id number]  D: Patient has a flat affect on approach today. Denies much change in mood since admission. Reports depression and anxiety "5" and hopelessness "4" on scale. Still has some passive SI on and off but no active thoughts. Mostly complains of back pain which received lidocaine patch and ibuprofen for today. Complained of constipation and got some mag citrate for it. Looking for long term placement. A: Staff will continue to monitor on q 15 minute checks , give meds and follow treatment plan as ordered. R: Cooperative on the unit.

## 2015-01-27 NOTE — Progress Notes (Signed)
[facility] Group Notes:  (Nursing/MHT/Case Management/Adjunct)  Date:  01/27/2015  Time: 2045  Type of Therapy:  wrap up group  Participation Level:  Active  Participation Quality:  Appropriate, Attentive, Sharing and Supportive  Affect:  Appropriate and Flat  Cognitive:  Alert  Insight:  Good  Engagement in Group:  Engaged  Modes of Intervention:  Clarification, Education and Support  Summary of Progress/Problems:  Rodney Hartman 01/27/2015, 9:45 PM
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
## 2015-01-28 MED ORDER — LISINOPRIL 10 MG PO TABS: 10.0000 mg | ORAL_TABLET | Freq: Every day | ORAL | Status: DC

## 2015-01-28 MED ORDER — NICOTINE 21 MG/24HR TD PT24: 21.0000 mg | MEDICATED_PATCH | Freq: Every day | TRANSDERMAL | Status: DC

## 2015-01-28 MED ORDER — HYDROXYZINE HCL 25 MG PO TABS: 25.0000 mg | ORAL_TABLET | Freq: Three times a day (TID) | ORAL | Status: DC | PRN

## 2015-01-28 MED ORDER — DOCUSATE SODIUM 100 MG PO CAPS: 100.0000 mg | ORAL_CAPSULE | Freq: Two times a day (BID) | ORAL | Status: DC

## 2015-01-28 MED ORDER — DULOXETINE HCL 20 MG PO CPEP: 20.0000 mg | ORAL_CAPSULE | Freq: Every day | ORAL | Status: DC

## 2015-01-28 MED ORDER — THIAMINE HCL 100 MG PO TABS: 100.0000 mg | ORAL_TABLET | Freq: Every day | ORAL | Status: DC

## 2015-01-28 MED ORDER — TRAZODONE HCL 100 MG PO TABS: 100.0000 mg | ORAL_TABLET | Freq: Every evening | ORAL | Status: DC | PRN

## 2015-01-28 MED ORDER — FOLIC ACID 1 MG PO TABS: 1.0000 mg | ORAL_TABLET | Freq: Every day | ORAL | Status: DC

## 2015-01-28 MED ORDER — GABAPENTIN 100 MG PO CAPS: 100.0000 mg | ORAL_CAPSULE | Freq: Three times a day (TID) | ORAL | Status: DC

## 2015-01-28 MED ORDER — HYDROXYZINE HCL 25 MG PO TABS
25.0000 mg | ORAL_TABLET | Freq: Three times a day (TID) | ORAL | Status: DC | PRN
  Filled 2015-01-28: qty 20

## 2015-01-28 MED ORDER — LIDOCAINE 5 % EX PTCH: 1.0000 | MEDICATED_PATCH | Freq: Every day | CUTANEOUS | Status: DC

## 2015-01-28 MED ORDER — ACETAMINOPHEN 325 MG PO TABS: 625.0000 mg | ORAL_TABLET | Freq: Four times a day (QID) | ORAL | Status: DC | PRN
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
## 2015-01-28 NOTE — Progress Notes (Signed)
Recreation Therapy Notes  Date: 01.20.2017 Time: 9:30am Location: [address] Group Room   Group Topic: Stress Management  Goal Area(s) Addresses:  Patient will actively participate in stress management techniques presented during session.   Behavioral Response: Did not attend.   Marykay Lex Abraham Margulies, LRT/CTRS        Leora Platt L 01/28/2015 2:00 PM

## 2015-01-28 NOTE — BHH Group Notes (Signed)
[facility] LCSW Aftercare Discharge Planning Group Note  01/28/2015  8:45 AM  Participation Quality: Did Not Attend. Patient invited to participate but declined.  Samuella Bruin, MSW, [employer] Clinical Social Worker [facility] [phone]

## 2015-01-28 NOTE — Progress Notes (Signed)
Rodney Hartman at [facility] called requesting progress note stating that pt is no longer SI. CSW faxed to Rodney Hartman. She will call back if pt can be accepted for treatment at [facility] today.
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
## 2015-01-28 NOTE — Progress Notes (Signed)
D) Pt is being discharged to [facility]. Mood is depressed, Affect angry. States that he has been having a tough time and wants to go to a place that will help him. Pt denies SI and HI. Delusions and hallucinations. Rates his depression at a 5, hopelessness at a 4 and his anxiety at a 5. Has spent the morning in his bed and did not get up except for meals.  A) Given support and reassurance. Provided with a 1:1. All personal belongings returned to Pt. All medications and follow up plans explained to the Pt.  R) Pt denies SI and HI.

## 2015-01-28 NOTE — Progress Notes (Signed)
  [facility] Adult Case Management Discharge Plan :  Will you be returning to the same living situation after discharge:  No. Patient plans to go to [facility] At discharge, do you have transportation home?: Yes,  [facility] to provide transport Do you have the ability to pay for your medications: Yes,  patient will be provided with prescriptions at discharge  Release of information consent forms completed and in the chart;  Patient's signature needed at discharge.  Patient to Follow up at: Follow-up Information    Follow up with [facility] On 01/28/2015.   Why:  Go today for continued treatment. Call if you need to reschedule.   Contact information:   [address] [name]  [phone]      Next level of care provider has access to [facility] Link:no  Safety Planning and Suicide Prevention discussed: Yes,  with patient  Have you used any form of tobacco in the last 30 days? (Cigarettes, Smokeless Tobacco, Cigars, and/or Pipes): Yes  Has patient been referred to the Quitline?: Patient refused referral  Patient has been referred for addiction treatment: Yes  Rodney Hartman, Rodney Hartman 01/28/2015, 11:49 AM

## 2015-07-30 ENCOUNTER — Inpatient Hospital Stay (HOSPITAL_COMMUNITY)
Admission: AD | Admit: 2015-07-30 | Discharge: 2015-08-09 | DRG: 885 | Disposition: A | Discharge status: Home / Self Care | Payer: Federal, State, Local not specified - Other | Source: Intra-hospital | Attending: Psychiatry | Admitting: Psychiatry

## 2015-07-30 ENCOUNTER — Emergency Department (HOSPITAL_COMMUNITY)
Admission: EM | Admit: 2015-07-30 | Discharge: 2015-07-30 | Payer: No Typology Code available for payment source | Attending: Emergency Medicine | Admitting: Emergency Medicine

## 2015-07-30 ENCOUNTER — Encounter (HOSPITAL_COMMUNITY): Payer: Self-pay | Admitting: Emergency Medicine

## 2015-07-30 ENCOUNTER — Encounter (HOSPITAL_COMMUNITY): Payer: Self-pay | Admitting: *Deleted

## 2015-07-30 DIAGNOSIS — R45851 Suicidal ideations: Secondary | ICD-10-CM | POA: Diagnosis present

## 2015-07-30 DIAGNOSIS — Z915 Personal history of self-harm: Secondary | ICD-10-CM

## 2015-07-30 DIAGNOSIS — Z79899 Other long term (current) drug therapy: Secondary | ICD-10-CM | POA: Insufficient documentation

## 2015-07-30 DIAGNOSIS — F17210 Nicotine dependence, cigarettes, uncomplicated: Secondary | ICD-10-CM | POA: Insufficient documentation

## 2015-07-30 DIAGNOSIS — R45850 Homicidal ideations: Secondary | ICD-10-CM | POA: Diagnosis present

## 2015-07-30 DIAGNOSIS — F1094 Alcohol use, unspecified with alcohol-induced mood disorder: Secondary | ICD-10-CM

## 2015-07-30 DIAGNOSIS — Z818 Family history of other mental and behavioral disorders: Secondary | ICD-10-CM

## 2015-07-30 DIAGNOSIS — F10920 Alcohol use, unspecified with intoxication, uncomplicated: Secondary | ICD-10-CM

## 2015-07-30 DIAGNOSIS — I10 Essential (primary) hypertension: Secondary | ICD-10-CM | POA: Insufficient documentation

## 2015-07-30 DIAGNOSIS — G4700 Insomnia, unspecified: Secondary | ICD-10-CM | POA: Diagnosis present

## 2015-07-30 DIAGNOSIS — F411 Generalized anxiety disorder: Secondary | ICD-10-CM | POA: Diagnosis present

## 2015-07-30 DIAGNOSIS — F10120 Alcohol abuse with intoxication, uncomplicated: Secondary | ICD-10-CM | POA: Insufficient documentation

## 2015-07-30 DIAGNOSIS — Z590 Homelessness: Secondary | ICD-10-CM

## 2015-07-30 DIAGNOSIS — F332 Major depressive disorder, recurrent severe without psychotic features: Principal | ICD-10-CM | POA: Diagnosis present

## 2015-07-30 LAB — COMPREHENSIVE METABOLIC PANEL
ALT: 75 U/L — ABNORMAL HIGH (ref 17–63)
AST: 74 U/L — AB (ref 15–41)
Albumin: 4.2 g/dL (ref 3.5–5.0)
Alkaline Phosphatase: 76 U/L (ref 38–126)
Anion gap: 11 (ref 5–15)
BILIRUBIN TOTAL: 0.7 mg/dL (ref 0.3–1.2)
BUN: 7 mg/dL (ref 6–20)
CO2: 25 mmol/L (ref 22–32)
Calcium: 9 mg/dL (ref 8.9–10.3)
Chloride: 102 mmol/L (ref 101–111)
Creatinine, Ser: 0.85 mg/dL (ref 0.61–1.24)
GFR calc Af Amer: >60 mL/min (ref >60)
Glucose, Bld: 92 mg/dL (ref 65–99)
POTASSIUM: 3.8 mmol/L (ref 3.5–5.1)
Sodium: 138 mmol/L (ref 135–145)
TOTAL PROTEIN: 7.6 g/dL (ref 6.5–8.1)

## 2015-07-30 LAB — ETHANOL: ALCOHOL ETHYL (B): 370 mg/dL — AB (ref <5)

## 2015-07-30 LAB — RAPID URINE DRUG SCREEN, HOSP PERFORMED
Amphetamines: NOT DETECTED
BARBITURATES: NOT DETECTED
Benzodiazepines: NOT DETECTED
Cocaine: NOT DETECTED
Opiates: NOT DETECTED
Tetrahydrocannabinol: NOT DETECTED

## 2015-07-30 LAB — CBC
HEMATOCRIT: 38 % — AB (ref 39.0–52.0)
Hemoglobin: 13.1 g/dL (ref 13.0–17.0)
MCH: 30.8 pg (ref 26.0–34.0)
MCHC: 34.5 g/dL (ref 30.0–36.0)
MCV: 89.4 fL (ref 78.0–100.0)
Platelets: 257 10*3/uL (ref 150–400)
RBC: 4.25 MIL/uL (ref 4.22–5.81)
RDW: 14.2 % (ref 11.5–15.5)
WBC: 3.2 10*3/uL — AB (ref 4.0–10.5)

## 2015-07-30 LAB — SALICYLATE LEVEL: Salicylate Lvl: <4 mg/dL (ref 2.8–30.0)

## 2015-07-30 LAB — ACETAMINOPHEN LEVEL: Acetaminophen (Tylenol), Serum: <10 ug/mL — ABNORMAL LOW (ref 10–30)

## 2015-07-30 MED ORDER — LISINOPRIL 10 MG PO TABS
10.0000 mg | ORAL_TABLET | Freq: Once | ORAL | Status: AC
  Administered 2015-07-30: 10 mg via ORAL
  Filled 2015-07-30: qty 1

## 2015-07-30 MED ORDER — THIAMINE HCL 100 MG/ML IJ SOLN
100.0000 mg | Freq: Once | INTRAMUSCULAR | Status: AC
  Administered 2015-07-30: 100 mg via INTRAMUSCULAR
  Filled 2015-07-30: qty 2

## 2015-07-30 MED ORDER — ONDANSETRON 4 MG PO TBDP: 4.0000 mg | ORAL_TABLET | Freq: Four times a day (QID) | ORAL | Status: AC | PRN

## 2015-07-30 MED ORDER — HYDROXYZINE HCL 25 MG PO TABS
25.0000 mg | ORAL_TABLET | Freq: Four times a day (QID) | ORAL | Status: DC | PRN
  Administered 2015-07-31 – 2015-08-08 (×8): 25 mg via ORAL
  Filled 2015-07-30 (×4): qty 1
  Filled 2015-07-30: qty 20
  Filled 2015-07-30 (×4): qty 1

## 2015-07-30 MED ORDER — TRAZODONE HCL 100 MG PO TABS
100.0000 mg | ORAL_TABLET | Freq: Every evening | ORAL | Status: DC | PRN
  Administered 2015-07-30 – 2015-08-05 (×6): 100 mg via ORAL
  Filled 2015-07-30 (×6): qty 1

## 2015-07-30 MED ORDER — LORAZEPAM 1 MG PO TABS
1.0000 mg | ORAL_TABLET | Freq: Two times a day (BID) | ORAL | Status: AC
  Administered 2015-08-01 – 2015-08-02 (×2): 1 mg via ORAL
  Filled 2015-07-30 (×2): qty 1

## 2015-07-30 MED ORDER — LORAZEPAM 1 MG PO TABS
1.0000 mg | ORAL_TABLET | Freq: Four times a day (QID) | ORAL | Status: AC
  Administered 2015-07-30 – 2015-07-31 (×4): 1 mg via ORAL
  Filled 2015-07-30 (×3): qty 1

## 2015-07-30 MED ORDER — VITAMIN B-1 100 MG PO TABS
100.0000 mg | ORAL_TABLET | Freq: Every day | ORAL | Status: DC
  Administered 2015-07-31 – 2015-08-09 (×10): 100 mg via ORAL
  Filled 2015-07-30 (×9): qty 1
  Filled 2015-07-30: qty 14
  Filled 2015-07-30: qty 1

## 2015-07-30 MED ORDER — LORAZEPAM 1 MG PO TABS
1.0000 mg | ORAL_TABLET | Freq: Four times a day (QID) | ORAL | Status: AC | PRN
  Filled 2015-07-30: qty 1

## 2015-07-30 MED ORDER — LOPERAMIDE HCL 2 MG PO CAPS
2.0000 mg | ORAL_CAPSULE | ORAL | Status: AC | PRN
  Administered 2015-07-30 – 2015-07-31 (×3): 2 mg via ORAL
  Filled 2015-07-30 (×2): qty 1
  Filled 2015-07-30: qty 2

## 2015-07-30 MED ORDER — GABAPENTIN 100 MG PO CAPS
100.0000 mg | ORAL_CAPSULE | Freq: Three times a day (TID) | ORAL | Status: DC
  Administered 2015-07-30 – 2015-08-03 (×13): 100 mg via ORAL
  Filled 2015-07-30 (×17): qty 1

## 2015-07-30 MED ORDER — ACETAMINOPHEN 325 MG PO TABS
650.0000 mg | ORAL_TABLET | Freq: Four times a day (QID) | ORAL | Status: DC | PRN
  Administered 2015-07-31 – 2015-08-08 (×14): 650 mg via ORAL
  Filled 2015-07-30 (×15): qty 2

## 2015-07-30 MED ORDER — LORAZEPAM 1 MG PO TABS
1.0000 mg | ORAL_TABLET | Freq: Every day | ORAL | Status: AC
  Administered 2015-08-03: 1 mg via ORAL
  Filled 2015-07-30: qty 1

## 2015-07-30 MED ORDER — ADULT MULTIVITAMIN W/MINERALS CH
1.0000 | ORAL_TABLET | Freq: Every day | ORAL | Status: DC
  Administered 2015-07-30 – 2015-08-09 (×11): 1 via ORAL
  Filled 2015-07-30 (×12): qty 1

## 2015-07-30 MED ORDER — LORAZEPAM 1 MG PO TABS
1.0000 mg | ORAL_TABLET | Freq: Three times a day (TID) | ORAL | Status: AC
  Administered 2015-07-31 – 2015-08-01 (×3): 1 mg via ORAL
  Filled 2015-07-30 (×3): qty 1

## 2015-07-30 MED ORDER — DULOXETINE HCL 20 MG PO CPEP
20.0000 mg | ORAL_CAPSULE | Freq: Every day | ORAL | Status: DC
  Administered 2015-07-30 – 2015-08-03 (×5): 20 mg via ORAL
  Filled 2015-07-30 (×10): qty 1

## 2015-07-30 NOTE — ED Provider Notes (Signed)
CSN: [id number]     Arrival date & time 07/30/15  0106 History  By signing my name below, I, Emmanuella Mensah, attest that this documentation has been prepared under the direction and in the presence of Geoffery Lyons, MD. Electronically Signed: Angelene Giovanni, ED Scribe. 07/30/2015. 3:27 AM.    Chief Complaint  Patient presents with  . Medical Clearance  . Homicidal  . Suicidal   The history is provided by the patient. No language interpreter was used.   HPI Comments: Rodney Hartman is a 55 y.o. male with a hx of hypertension who presents to the Emergency Department for evaluation s/p SI and HI. He reports that he wanted to kill "a couple" people along with himself as he no longer cares anymore. He states that he lost his job, then his house, and everything else after that. He reports a hx of depression and states that he has been non-compliant with his medications for approx. 2 months. No other medical complaints at this time.    Past Medical History  Diagnosis Date  . Hypertension    Past Surgical History  Procedure Laterality Date  . Tonsillectomy    . 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    History reviewed. No pertinent family history. Social History  Substance Use Topics  . Smoking status: Current Some Day Smoker -- 0.50 packs/day    Types: Cigarettes  . Smokeless tobacco: None  . Alcohol Use: 3.6 oz/week    6 Cans of beer per week     Comment: every other day     Review of Systems  A complete 10 system review of systems was obtained and all systems are negative except as noted in the HPI and PMH.    Allergies  Review of patient's allergies indicates no known allergies.  Home Medications   Prior to Admission medications   Medication Sig Start Date End Date Taking? Authorizing Provider  docusate sodium (COLACE) 100 MG capsule Take 1 capsule (100 mg total) by mouth 2 (two) times  daily. For constipation 01/28/15  Yes Sanjuana Kava, NP  DULoxetine (CYMBALTA) 20 MG capsule Take 1 capsule (20 mg total) by mouth daily. For depression 01/28/15  Yes Sanjuana Kava, NP  folic acid (FOLVITE) 1 MG tablet Take 1 tablet (1 mg total) by mouth daily. For low Folate 01/28/15  Yes Sanjuana Kava, NP  gabapentin (NEURONTIN) 100 MG capsule Take 1 capsule (100 mg total) by mouth 3 (three) times daily. For agitation 01/28/15  Yes Sanjuana Kava, NP  hydrOXYzine (ATARAX/VISTARIL) 25 MG tablet Take 1 tablet (25 mg total) by mouth 3 (three) times daily as needed for anxiety. 01/28/15  Yes Sanjuana Kava, NP  lidocaine (LIDODERM) 5 % Place 1 patch onto the skin daily. Remove & Discard patch within 12 hours or as directed by MD: For pain management 01/28/15  Yes Sanjuana Kava, NP  lisinopril (PRINIVIL,ZESTRIL) 10 MG tablet Take 1 tablet (10 mg total) by mouth daily. For high blood pressure 01/28/15  Yes Sanjuana Kava, NP  thiamine 100 MG tablet Take 1 tablet (100 mg total) by mouth daily. For low thiamine 01/28/15  Yes Sanjuana Kava, NP  traZODone (DESYREL) 100 MG tablet Take 1 tablet (100 mg total) by mouth at bedtime and may repeat dose one time if needed. For sleep 01/28/15  Yes Sanjuana Kava,  NP  nicotine (NICODERM CQ - DOSED IN MG/24 HOURS) 21 mg/24hr patch Place 1 patch (21 mg total) onto the skin daily. For smoking cessation Patient not taking: Reported on 07/30/2015 01/28/15   Sanjuana Kava, NP   BP 145/93 mmHg  Pulse 82  Temp(Src) 99.6 F (37.6 C) (Oral)  Resp 18  SpO2 96% Physical Exam  Constitutional: He is oriented to person, place, and time. He appears well-developed and well-nourished.  HENT:  Head: Normocephalic and atraumatic.  Eyes: EOM are normal.  Neck: Normal range of motion.  Cardiovascular: Normal rate, regular rhythm, normal heart sounds and intact distal pulses.   Pulmonary/Chest: Effort normal and breath sounds normal. No respiratory distress.  Abdominal: Soft. He exhibits no  distension. There is no tenderness.  Musculoskeletal: Normal range of motion.  Neurological: He is alert and oriented to person, place, and time.  Skin: Skin is warm and dry.  Psychiatric: He has a normal mood and affect. Judgment normal.  Nursing note and vitals reviewed.   ED Course  Procedures (including critical care time) DIAGNOSTIC STUDIES: Oxygen Saturation is 96% on RA, normal by my interpretation.    COORDINATION OF CARE: 3:07 AM- Pt advised of plan for treatment and pt agrees. Pt will receive lab work for further evaluation.    Labs Review Labs Reviewed  COMPREHENSIVE METABOLIC PANEL  ETHANOL  SALICYLATE LEVEL  ACETAMINOPHEN LEVEL  CBC  URINE RAPID DRUG SCREEN, HOSP PERFORMED    Geoffery Lyons, MD has personally reviewed and evaluated these lab results as part of his medical decision-making.  MDM   Final diagnoses:  None    Patient with history of depression. He presents with suicidal and homicidal ideation. He is intoxicated with a blood alcohol of 370. He will require time to sober up prior to undergoing TTS consultation. Other than his intoxication, he appears medically cleared for evaluation.  I personally performed the services described in this documentation, which was scribed in my presence. The recorded information has been reviewed and is accurate.      Geoffery Lyons, MD 07/30/15 [phone]
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
## 2015-07-30 NOTE — ED Notes (Signed)
TTS consult in process at bedside at this time. 

## 2015-07-30 NOTE — ED Notes (Signed)
Pt reports he is independently able to bathe himself. Has walked twice to the restroom without assistance.

## 2015-07-30 NOTE — Progress Notes (Signed)
Patient arrived here from [facility]). Patient is irritable and sad. Patient stated that he drinks at least 10-15 12 oz of beer and smokes 5 cigs daily. He denies any illicit drugs use. He has slight tremors, sweating, and irritability. He stated that he felt passive SI and denies AVH. He stated that he had HI towards his ex-boss, niece, and niece's husband. He stated that he felt HI towards everybody. He denies pain. Patient is homeless with uncertainty were to go. He is alert and oriented x4. He had healed surgical scar on posterior neck.  Patient remains safe with constant monitoring with exception of bathroom times. He is offered support, encouragement, and education. He was contracted to safety via document and verbally.   Patient is receptive and compliant, will continue to monitor.

## 2015-07-30 NOTE — Progress Notes (Signed)
D: Patient appear sad and depressed. Speech slow and minimal. Patient denied pain, SI, AH/VH at this time. Endorses depression 7/10.  Staff observed patient using bathroom frequently. Offered Imodium as ordered - good effect. Patient sleeping at this time.  A: Support offered as needed. Constant observation for safety maintained. Due/prn meds given as needed. No behavioral issues noted.  R: Patient remains safe and appropriate.

## 2015-07-30 NOTE — BH Assessment (Signed)
07/30/15. 0945. Dr Jannifer Franklin recommends admission to OBS unit for this pt.  Pt accepted to [facility] OBS unit, bed 2.  Can transfer at 1230. Daleen Squibb, LCSW

## 2015-07-30 NOTE — ED Notes (Signed)
Pt here with [employer] with thoughts of killing his former boss along with killing himself. Pt states that he lost his job and then his house. Pt is calm and cooperative at time of assessment, but he intermittently screams for no reason while in the triage room and needs constant redirection by [employer]. Pt states he has been off his medication for over 2 months because he can not afford them.

## 2015-07-31 ENCOUNTER — Encounter (HOSPITAL_COMMUNITY): Payer: Self-pay | Admitting: Registered Nurse

## 2015-07-31 DIAGNOSIS — R45851 Suicidal ideations: Secondary | ICD-10-CM

## 2015-07-31 DIAGNOSIS — F10120 Alcohol abuse with intoxication, uncomplicated: Secondary | ICD-10-CM

## 2015-07-31 DIAGNOSIS — F332 Major depressive disorder, recurrent severe without psychotic features: Secondary | ICD-10-CM | POA: Diagnosis not present

## 2015-07-31 DIAGNOSIS — R45850 Homicidal ideations: Secondary | ICD-10-CM | POA: Diagnosis not present

## 2015-07-31 DIAGNOSIS — F1094 Alcohol use, unspecified with alcohol-induced mood disorder: Secondary | ICD-10-CM
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
## 2015-08-01 DIAGNOSIS — R45850 Homicidal ideations: Secondary | ICD-10-CM | POA: Diagnosis present

## 2015-08-01 DIAGNOSIS — R45851 Suicidal ideations: Secondary | ICD-10-CM | POA: Diagnosis not present

## 2015-08-01 DIAGNOSIS — Z818 Family history of other mental and behavioral disorders: Secondary | ICD-10-CM | POA: Diagnosis not present

## 2015-08-01 DIAGNOSIS — F411 Generalized anxiety disorder: Secondary | ICD-10-CM | POA: Diagnosis present

## 2015-08-01 DIAGNOSIS — G4700 Insomnia, unspecified: Secondary | ICD-10-CM | POA: Diagnosis present

## 2015-08-01 DIAGNOSIS — Z915 Personal history of self-harm: Secondary | ICD-10-CM | POA: Diagnosis not present

## 2015-08-01 DIAGNOSIS — Z590 Homelessness: Secondary | ICD-10-CM | POA: Diagnosis not present

## 2015-08-01 DIAGNOSIS — I10 Essential (primary) hypertension: Secondary | ICD-10-CM | POA: Diagnosis present

## 2015-08-01 DIAGNOSIS — F332 Major depressive disorder, recurrent severe without psychotic features: Secondary | ICD-10-CM | POA: Diagnosis not present

## 2015-08-01 DIAGNOSIS — F17210 Nicotine dependence, cigarettes, uncomplicated: Secondary | ICD-10-CM | POA: Diagnosis present

## 2015-08-01 DIAGNOSIS — F10120 Alcohol abuse with intoxication, uncomplicated: Secondary | ICD-10-CM | POA: Diagnosis present

## 2015-08-01 LAB — LIPID PANEL
CHOL/HDL RATIO: 2 ratio
Cholesterol: 184 mg/dL (ref 0–200)
HDL: 90 mg/dL (ref >40)
LDL CALC: 69 mg/dL (ref 0–99)
Triglycerides: 125 mg/dL (ref <150)
VLDL: 25 mg/dL (ref 0–40)

## 2015-08-01 LAB — TSH: TSH: 2.346 u[IU]/mL (ref 0.350–4.500)

## 2015-08-01 MED ORDER — NICOTINE POLACRILEX 2 MG MT GUM
2.0000 mg | CHEWING_GUM | OROMUCOSAL | Status: DC | PRN
  Administered 2015-08-06: 2 mg via ORAL
  Filled 2015-08-01: qty 1

## 2015-08-01 NOTE — Progress Notes (Signed)
Report given to Rodell Perna, RN on Adult Inpatient Unit

## 2015-08-01 NOTE — Progress Notes (Signed)
Pt admitted to the adult unit from obs. Pt reports seeking treatment for drinking 5 40oz daily, suicidal thoughts to shoot himself and HI towards his boss and niece's husband.
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
## 2015-08-01 NOTE — Progress Notes (Signed)
Pt attended evening AA group. 

## 2015-08-01 NOTE — Progress Notes (Signed)
D: Patient continues to endorse depression. Rated 5/10. Reports headache of 8/10. Denies SI, AH/VH at this time. Sleeping on/off.  A: Staff offered support and encouragement as needed. Offered meds/prn as ordered. Safety maintained by constant observation.  R: Patient sleeping at this time. Will continue to monitor patient.
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
## 2015-08-01 NOTE — Progress Notes (Signed)
Patient transferred to Adult Unit, Bed 306-02; accompanied by [facility] RN; pt ambulatory and in stable condition

## 2015-08-02 LAB — PROLACTIN: Prolactin: 18.9 ng/mL — ABNORMAL HIGH (ref 4.0–15.2)

## 2015-08-02 LAB — HEMOGLOBIN A1C
Hgb A1c MFr Bld: 5.3 % (ref 4.8–5.6)
Mean Plasma Glucose: 105 mg/dL

## 2015-08-02 NOTE — H&P (Signed)
Psychiatric Admission Assessment Adult  Patient Identification: Rodney Hartman MRN:  [id number] Date of Evaluation:  08/02/2015 Chief Complaint:  " I felt like I was going to kill myself " Principal Diagnosis: MDD (major depressive disorder), recurrent severe, without psychosis (HCC) Diagnosis:   Patient Active Problem List   Diagnosis Date Noted  . Alcohol abuse with intoxication, uncomplicated (HCC) [F10.120] 07/31/2015  . MDD (major depressive disorder), recurrent severe, without psychosis (HCC) [F33.2] 07/31/2015  . Suicidal ideation [R45.851] 07/31/2015  . Homicidal ideation [R45.850] 07/31/2015  . Alcohol-induced mood disorder (HCC) [F10.94] 01/03/2015  . Alcohol use disorder, severe, dependence (HCC) [F10.20] 12/17/2014  . Alcohol abuse [F10.10] 08/07/2014   History of Present Illness: 55 year old man. Patient states that he asked police for help because he was feeling suicidal and also had homicidal ideations ,towards his nephew.  He reports worsening depression and describes significant psychosocial stressors, including recently losing his job after [employer] business he was working for closed, and was also recently evicted due to being unable to pay rent  States he has had issues at home, with his nephew , who is " about 67 years old". States " he is strung out on drugs, and has trashed up the house ".  States he has been feeling depressed, sad, but also feeling angry, specifically with his nephew. Patient has history of alcohol dependence, and had been drinking about 5 40 ounce beers per day.  At this time reports some vague symptoms of WDL, but does not appear in any acute distress or discomfort- vitals are stable, no tremors, no diaphoresis. Has not been taking psychiatric medications in several months .  Associated Signs/Symptoms: Depression Symptoms:  depressed mood, anhedonia, insomnia, suicidal thoughts without plan, anxiety, loss of energy/fatigue, decreased  appetite, has lost an unspecified amount of weight (Hypo) Manic Symptoms:  No  Anxiety Symptoms: states he has been feeling anxious and has had some panic attacks  Psychotic Symptoms: None at this time  PTSD Symptoms: Describes some PTSD symptoms from childhood traumatic experiences , but states symptoms " come and go", are intermittent, and PTSD symptoms are not major issue at this time Total Time spent with patient: 45 minutes   Past Psychiatric History:  Patient has had prior psychiatric admissions, most recently January 17 , at which time he presented for detox and with depression and auditory hallucinations. At the time was referred to [facility] upon discharge. History of one suicide attempt 20 years ago or so, by jumping . Has been diagnosed with MDD and with  Alcohol Dependence in the past .  No recent psychiatric follow ups, states he did not follow up with outpatient care after his prior admission   Is the patient at risk to self? Yes.    Has the patient been a risk to self in the past 6 months? Yes.    Has the patient been a risk to self within the distant past? Yes.    Is the patient a risk to others? Yes.    Has the patient been a risk to others in the past 6 months? No.  Has the patient been a risk to others within the distant past? No.   Prior Inpatient Therapy:  as above  Prior Outpatient Therapy:  has not been following up   Alcohol Screening: 1. How often do you have a drink containing alcohol?: 4 or more times a week 2. How many drinks containing alcohol do you have on a typical day when  you are drinking?: 5 or 6 3. How often do you have six or more drinks on one occasion?: Less than monthly Preliminary Score: 3 4. How often during the last year have you found that you were not able to stop drinking once you had started?: Weekly 5. How often during the last year have you failed to do what was normally expected from you becasue of drinking?: Less than monthly 6. How often  during the last year have you needed a first drink in the morning to get yourself going after a heavy drinking session?: Weekly 7. How often during the last year have you had a feeling of guilt of remorse after drinking?: Weekly 8. How often during the last year have you been unable to remember what happened the night before because you had been drinking?: Weekly 9. Have you or someone else been injured as a result of your drinking?: No 10. Has a relative or friend or a doctor or another health worker been concerned about your drinking or suggested you cut down?: Yes, during the last year Alcohol Use Disorder Identification Test Final Score (AUDIT): 24 Brief Intervention: Yes Substance Abuse History in the last 12 months: alcohol dependence, denies drug abuse  Consequences of Substance Abuse: Describes history of blackouts, denies history of seizures, (+) history of DTs . DUI many years ago. Previous Psychotropic Medications: most recent medications were Cymbalta, Neurontin, Trazodone, as noted stopped them several months ago. Psychological Evaluations: denies  Past Medical History:  Past Medical History:  Diagnosis Date  . Hypertension     Past Surgical History:  Procedure Laterality Date  . 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   . TONSILLECTOMY     Family History:  Mother alive, father died from GI bleed (?) , has two sisters,  one of whom recently passed away from breast cancer  Family Psychiatric  History: states there is a history of depression in family, history of suicide attempts- ( sister) , father was alcoholic  Tobacco Screening:  Smokes about 5 cigarettes per day  Social History: separated, has 2 adult sons, recently working in Aeronautical [employer], lost job, and recently evicted from his apartment due to being unable to afford rent, no SO at this time, denies legal issues  History  Alcohol Use  . 3.6  oz/week  . 6 Cans of beer per week    Comment: every other day      History  Drug Use No    Additional Social History:  Allergies:  No Known Allergies Lab Results:  Results for orders placed or performed during the hospital encounter of 07/30/15 (from the past 48 hour(s))  Prolactin     Status: Abnormal   Collection Time: 08/01/15  6:30 AM  Result Value Ref Range   Prolactin 18.9 (H) 4.0 - 15.2 ng/mL    Comment: (NOTE) Performed At: [facility] [address] [place], [place] [id number] Mila Homer MD [id number] Performed at [facility]   TSH     Status: None   Collection Time: 08/01/15  6:30 AM  Result Value Ref Range   TSH 2.346 0.350 - 4.500 uIU/mL    Comment: Performed at [facility]  Lipid panel     Status: None   Collection Time: 08/01/15  6:30 AM  Result Value Ref Range   Cholesterol 184 0 - 200 mg/dL  Triglycerides 125 <150 mg/dL   HDL 90 >16 mg/dL   Total CHOL/HDL Ratio 2.0 RATIO   VLDL 25 0 - 40 mg/dL   LDL Cholesterol 69 0 - 99 mg/dL    Comment:        Total Cholesterol/HDL:CHD Risk Coronary Heart Disease Risk Table                     Men   Women  1/2 Average Risk   3.4   3.3  Average Risk       5.0   4.4  2 X Average Risk   9.6   7.1  3 X Average Risk  23.4   11.0        Use the calculated Patient Ratio above and the CHD Risk Table to determine the patient's CHD Risk.        ATP III CLASSIFICATION (LDL):  <100     mg/dL   Optimal  109-604  mg/dL   Near or Above                    Optimal  130-159  mg/dL   Borderline  540-981  mg/dL   High  >191     mg/dL   Very High Performed at [facility]   Hemoglobin A1c     Status: None   Collection Time: 08/01/15  6:30 AM  Result Value Ref Range   Hgb A1c MFr Bld 5.3 4.8 - 5.6 %    Comment: (NOTE)         Pre-diabetes: 5.7 - 6.4         Diabetes: >6.4         Glycemic control for adults with diabetes: <7.0    Mean Plasma Glucose 105  mg/dL    Comment: (NOTE) Performed At: [facility] [address] [place], [place] [id number] Mila Homer MD [id number] Performed at [facility]     Blood Alcohol level:  Lab Results  Component Value Date   ETH 370 [facility]) 07/30/2015   ETH 41 (H) 01/24/2015    Metabolic Disorder Labs:  Lab Results  Component Value Date   HGBA1C 5.3 08/01/2015   MPG 105 08/01/2015   Lab Results  Component Value Date   PROLACTIN 18.9 (H) 08/01/2015   Lab Results  Component Value Date   CHOL 184 08/01/2015   TRIG 125 08/01/2015   HDL 90 08/01/2015   CHOLHDL 2.0 08/01/2015   VLDL 25 08/01/2015   LDLCALC 69 08/01/2015    Current Medications: Current Facility-Administered Medications  Medication Dose Route Frequency Provider Last Rate Last Dose  . acetaminophen (TYLENOL) tablet 650 mg  650 mg Oral Q6H PRN Kristeen Mans, NP   650 mg at 08/02/15 0851  . DULoxetine (CYMBALTA) DR capsule 20 mg  20 mg Oral Daily Kristeen Mans, NP   20 mg at 08/02/15 0851  . gabapentin (NEURONTIN) capsule 100 mg  100 mg Oral TID Kristeen Mans, NP   100 mg at 08/02/15 1209  . hydrOXYzine (ATARAX/VISTARIL) tablet 25 mg  25 mg Oral Q6H PRN Kristeen Mans, NP   25 mg at 08/01/15 2111  . [START ON 08/03/2015] LORazepam (ATIVAN) tablet 1 mg  1 mg Oral Daily Kristeen Mans, NP      . multivitamin with minerals tablet 1 tablet  1 tablet Oral Daily Kristeen Mans, NP   1 tablet at 08/02/15 0851  .  nicotine polacrilex (NICORETTE) gum 2 mg  2 mg Oral PRN Craige Cotta, MD      . thiamine (VITAMIN B-1) tablet 100 mg  100 mg Oral Daily Kristeen Mans, NP   100 mg at 08/02/15 0851  . traZODone (DESYREL) tablet 100 mg  100 mg Oral QHS PRN Kristeen Mans, NP   100 mg at 08/01/15 2111   PTA Medications: Prescriptions Prior to Admission  Medication Sig Dispense Refill Last Dose  . docusate sodium (COLACE) 100 MG capsule Take 1 capsule (100 mg total) by mouth 2 (two) times daily. For constipation 60  capsule 0 more than a month  . DULoxetine (CYMBALTA) 20 MG capsule Take 1 capsule (20 mg total) by mouth daily. For depression 30 capsule 0 more than a month  . folic acid (FOLVITE) 1 MG tablet Take 1 tablet (1 mg total) by mouth daily. For low Folate 30 tablet 0 more than a month  . gabapentin (NEURONTIN) 100 MG capsule Take 1 capsule (100 mg total) by mouth 3 (three) times daily. For agitation 90 capsule 0 more than a month  . hydrOXYzine (ATARAX/VISTARIL) 25 MG tablet Take 1 tablet (25 mg total) by mouth 3 (three) times daily as needed for anxiety. 90 tablet 0 more than a month  . lidocaine (LIDODERM) 5 % Place 1 patch onto the skin daily. Remove & Discard patch within 12 hours or as directed by MD: For pain management 7 patch 0 more than a month  . lisinopril (PRINIVIL,ZESTRIL) 10 MG tablet Take 1 tablet (10 mg total) by mouth daily. For high blood pressure 30 tablet 0 more than a month  . nicotine (NICODERM CQ - DOSED IN MG/24 HOURS) 21 mg/24hr patch Place 1 patch (21 mg total) onto the skin daily. For smoking cessation (Patient not taking: Reported on 07/30/2015) 28 patch 0 more than a month  . thiamine 100 MG tablet Take 1 tablet (100 mg total) by mouth daily. For low thiamine 30 tablet 0 more than a month  . traZODone (DESYREL) 100 MG tablet Take 1 tablet (100 mg total) by mouth at bedtime and may repeat dose one time if needed. For sleep 60 tablet 0 more than a month    Musculoskeletal: Strength & Muscle Tone: within normal limits Gait & Station: normal Patient leans: N/A  Psychiatric Specialty Exam: Physical Exam  Review of Systems  Constitutional: Positive for weight loss.  Eyes: Negative.        No visual disturbances    Respiratory: Negative.   Cardiovascular: Negative.   Gastrointestinal: Positive for nausea and vomiting. Negative for blood in stool.  Genitourinary: Negative.   Musculoskeletal: Positive for joint pain.  Skin: Negative.   Neurological: Positive for  headaches. Negative for seizures.  Endo/Heme/Allergies: Negative.   Psychiatric/Behavioral: Positive for depression, substance abuse and suicidal ideas.  All other systems reviewed and are negative.    Blood pressure 118/80, pulse 63, temperature 97.7 F (36.5 C), temperature source Oral, resp. rate 16, height 5' 10.5" (1.791 m), weight 172 lb (78 kg), SpO2 100 %.Body mass index is 24.33 kg/m.  General Appearance: Fairly Groomed  Eye Contact:  Fair  Speech:  decreased   Volume:  Decreased  Mood:  peresents depressed, also vaguely irritable   Affect:  Constricted and vaguely irritable   Thought Process:  Linear  Orientation:  Other:  fully alert , attentive - oriented x 3   Thought Content: denies hallucinations, no delusions , not internally preoccupied  Suicidal Thoughts:  No- denies any current suicidal plan or intention and contracts for safety on the unit  Homicidal Thoughts:  Yes- states he has thoughts of " beating up " his nephew, but does not endorse any homicidal ideations today .  Memory:  recent and remote grossly intact   Judgement:  Impaired  Insight:  Fair  Psychomotor Activity:  no current tremors, no diaphoresis, no acute distress , vitals stable   Concentration:  Concentration: Good and Attention Span: Good  Recall:  Good  Fund of Knowledge:  Good  Language:  Good  Akathisia:  No  Handed:  Right  AIMS (if indicated):     Assets:  Desire for Improvement Resilience  ADL's:  Intact  Cognition:  WNL  Sleep:          Treatment Plan Summary: Daily contact with patient to assess and evaluate symptoms and progress in treatment, Medication management, Plan inpatient admission  and medications as below   Observation Level/Precautions:  15 minute checks  Laboratory:  as neeed   Psychotherapy:  Support, milieu  Medications:  Has been restarted on Neurontin, Cymbalta, and is also on Ativan detox protocol to minimize risk of ETOH WDL   Consultations:  As needed    Discharge Concerns: homelessness     Estimated LOS: 5-6 days   Other:     I certify that inpatient services furnished can reasonably be expected to improve the patient's condition.    Nehemiah Massed, MD 7/25/20174:46 PM

## 2015-08-02 NOTE — [facility] Group Notes (Signed)
[facility] Group Notes:  (Nursing/MHT/Case Management/Adjunct)  Date:  08/02/2015  Time:  1:08 PM Type of Therapy:  Psychoeducational Skills  Participation Level:  Active  Participation Quality:  Appropriate  Affect:  Appropriate  Cognitive:  Appropriate  Insight:  Appropriate  Engagement in Group:  Engaged  Modes of Intervention:  Problem-solving Summary of Progress/Problems:  Topic was on discovery.  Group encouraged to surround themselves with positive and healthy group/support system when changing to a healthy life style.     Bethann Punches 08/02/2015, 1:08 PM

## 2015-08-02 NOTE — Progress Notes (Signed)
DAR NOTE: Patient presents with anxious affect and depressed mood.  Denies pain, auditory and visual hallucinations. Pt endorses passive SI but verbally contracted for safety.  Rates depression at 8, hopelessness at 4, and anxiety at 6.  Maintained on routine safety checks.  Medications given as prescribed.  Support and encouragement offered as needed.  Attended group and participated. Patient observed socializing with peers in the dayroom.  Offered no complaint.

## 2015-08-02 NOTE — Progress Notes (Signed)
Pt reported to nurses station at 01:15 stating that he was looking for his missing money (5 dollars). Writer let patient check his belongings in the laundry room, patient claims that money is not where he last had it, which was in the pocket of his jean shorts. Writer then checked his belongings sheet to see if the money was locked in his locker upon admission. Writer then called security and physically checked locker for the 5 dollars and birth certificate that patient was also looking for, but money was nowhere to be found. Writer notified patient's RN.

## 2015-08-02 NOTE — [facility] Suicide Risk Assessment (Signed)
[facility] INPATIENT:  Family/Significant Other Suicide Prevention Education  Suicide Prevention Education:  Patient Refusal for Family/Significant Other Suicide Prevention Education: The patient Rodney Hartman has refused to provide written consent for family/significant other to be provided Family/Significant Other Suicide Prevention Education during admission and/or prior to discharge.  Physician notified.  Pt reports that he does not have family supports at this time. He is denying SI/HI and states "I was just really angry and upset with those people." "I really just want to get some help for myself." He denies access to guns/firearms. SPE completed with pt, as pt refused to consent to family contact. SPI pamphlet provided to pt and pt was encouraged to share information with support network, ask questions, and talk about any concerns relating to SPE. Pt denies access to guns/firearms and verbalized understanding of information provided. Mobile Crisis information also provided to pt.    Smart, Shun Pletz LCSW 08/02/2015, 3:04 PM

## 2015-08-02 NOTE — [facility] Suicide Risk Assessment (Signed)
[facility] Admission Suicide Risk Assessment   Nursing information obtained from:   patient and chart  Demographic factors:   55 year old separated male  Current Mental Status:   see below  Loss Factors:   recent loss of job, recent eviction, difficulties with an adult nephew  Historical Factors:   history of depression, history of alcohol dependence  Risk Reduction Factors:   resilience   Total Time spent with patient: 45 minutes Principal Problem: MDD (major depressive disorder), recurrent severe, without psychosis (HCC) Diagnosis:   Patient Active Problem List   Diagnosis Date Noted  . Alcohol abuse with intoxication, uncomplicated (HCC) [F10.120] 07/31/2015  . MDD (major depressive disorder), recurrent severe, without psychosis (HCC) [F33.2] 07/31/2015  . Suicidal ideation [R45.851] 07/31/2015  . Homicidal ideation [R45.850] 07/31/2015  . Alcohol-induced mood disorder (HCC) [F10.94] 01/03/2015  . Alcohol use disorder, severe, dependence (HCC) [F10.20] 12/17/2014  . Alcohol abuse [F10.10] 08/07/2014     Continued Clinical Symptoms:  Alcohol Use Disorder Identification Test Final Score (AUDIT): 24 The "Alcohol Use Disorders Identification Test", Guidelines for Use in Primary Care, Second Edition.  World Science writer [facility]). Score between 0-7:  no or low risk or alcohol related problems. Score between 8-15:  moderate risk of alcohol related problems. Score between 16-19:  high risk of alcohol related problems. Score 20 or above:  warrants further diagnostic evaluation for alcohol dependence and treatment.   CLINICAL FACTORS:  55 year old male, history of alcohol dependence and depression, reports depression, suicidal and homicidal ideations ( directed at an adult nephew ) and heavy alcohol consumption     Psychiatric Specialty Exam: Physical Exam  ROS  Blood pressure 118/80, pulse 63, temperature 97.7 F (36.5 C), temperature source Oral, resp. rate 16, height 5' 10.5"  (1.791 m), weight 172 lb (78 kg), SpO2 100 %.Body mass index is 24.33 kg/m.   see admit note MSE    COGNITIVE FEATURES THAT CONTRIBUTE TO RISK:  Closed-mindedness and Loss of executive function    SUICIDE RISK:   Moderate:  Frequent suicidal ideation with limited intensity, and duration, some specificity in terms of plans, no associated intent, good self-control, limited dysphoria/symptomatology, some risk factors present, and identifiable protective factors, including available and accessible social support.   PLAN OF CARE: Patient will be admitted to inpatient psychiatric unit for stabilization and safety. Will provide and encourage milieu participation. Provide medication management and maked adjustments as needed. Will also provide medication management to minimize risk of alcohol WDL .   Will follow daily.    I certify that inpatient services furnished can reasonably be expected to improve the patient's condition.  Nehemiah Massed, MD 08/02/2015, 5:18 PM

## 2015-08-02 NOTE — Progress Notes (Signed)
D: Pt endorsed moderate anxiety, depression and passive SI; states, "I lost my job, my home and I have been out of my med for 2 months." Pt contracts for safety. Pt also endorsed severe back pain. Pt is a little shaky; Pt remained calm and cooperative. A: Medications offered as prescribed.  Support, encouragement, and safe environment provided.  15-minute safety checks continue. R: Pt was med compliant.  Pt attended AA group meeting. Safety checks continue.
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
## 2015-08-02 NOTE — Progress Notes (Signed)
Pt attended evening AA group. 

## 2015-08-02 NOTE — [facility] Group Notes (Signed)
[facility] LCSW Group Therapy  08/02/2015 3:38 PM  Type of Therapy:  Group Therapy  Participation Level:  Active  Participation Quality:  Attentive  Affect:  Appropriate  Cognitive:  Alert and Oriented  Insight:  Improving  Engagement in Therapy:  Improving  Modes of Intervention:  Discussion, Education, Exploration, Problem-solving, Rapport Building, Socialization and Support  Summary of Progress/Problems: MHA Speaker came to talk about his personal journey with substance abuse and addiction. The pt processed ways by which to relate to the speaker. MHA speaker provided handouts and educational information pertaining to groups and services offered by the [facility].    Smart, Murielle Stang LCSW 08/02/2015, 3:38 PM

## 2015-08-03 MED ORDER — DULOXETINE HCL 20 MG PO CPEP
40.0000 mg | ORAL_CAPSULE | Freq: Every day | ORAL | Status: DC
  Administered 2015-08-04 – 2015-08-07 (×4): 40 mg via ORAL
  Filled 2015-08-03 (×6): qty 2

## 2015-08-03 MED ORDER — GABAPENTIN 100 MG PO CAPS
200.0000 mg | ORAL_CAPSULE | Freq: Two times a day (BID) | ORAL | Status: DC
  Administered 2015-08-04 – 2015-08-05 (×3): 200 mg via ORAL
  Filled 2015-08-03 (×7): qty 2

## 2015-08-03 NOTE — Progress Notes (Signed)
Recreation Therapy Notes  Date: 08/03/15 Time: 0930 Location: 300 Hall Group Room  Group Topic: Stress Management  Goal Area(s) Addresses:  Patient will verbalize importance of using healthy stress management.  Patient will identify positive emotions associated with healthy stress management.   Intervention: Stress Management  Activity :  Progressive Muscle Relaxation.  LRT introduced the technique of progressive muscle relaxation to the patients.  LRT read a script to the patients for them to engage in the activity.  Pt were encouraged to follow along as the LRT read the script.  Education:  Stress Management, Discharge Planning.    Clinical Observations/Feedback: Pt did not attend group.   Vonte Rossin, LRT/CTRS  

## 2015-08-03 NOTE — [facility] Group Notes (Signed)
[facility] LCSW Group Therapy  08/03/2015 3:56 PM  Type of Therapy:  Group Therapy   DID NOT ATTEND. Pt chose to remain in bed. Invited  Summary of Progress/Problems: Today's Topic: Overcoming Obstacles. Patients identified one short term goal and potential obstacles in reaching this goal. Patients processed barriers involved in overcoming these obstacles. Patients identified steps necessary for overcoming these obstacles and explored motivation (internal and external) for facing these difficulties head on.   Smart, Kalum Minner LCSW 08/03/2015, 3:56 PM

## 2015-08-03 NOTE — Progress Notes (Signed)
DAR NOTE: Pt present with flat affect and depressed mood in the unit. Pt has been isolating himself , stating that he is not feeling good and reporting thoughts of hurting self but no plan at this particular time. Pt denies physical pain, took all his meds as scheduled. As per self inventory, pt had a fair night sleep, fair appetite, normal energy, and poor concentration. Pt rate depression at 6, hopeless ness at 7, and anxiety at 7. Pt's safety ensured with 15 minute and environmental checks. Pt currently denies HI and A/V hallucinations. Pt verbally agrees to seek staff if SI/HI or A/VH occurs and to consult with staff before acting on these thoughts. Will continue POC.
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
## 2015-08-03 NOTE — Progress Notes (Signed)
[facility] MD Progress Note  08/03/2015 7:06 PM Rodney Hartman  MRN:  [id number] Subjective:  Patient reports ongoing depression , sadness. At this time reports some passive SI, but denies current plan or intention of hurting self , contracts for safety on the unit. He is less focused on anger towards his nephew/family member, and at this time denies homicidal ideations . Denies medication side effects, but states he has been feeling vaguely dizzy. He is future oriented, and he wants to go to a rehab after discharge from unit. Objective : I have discussed case with treatment team and have met with patient . Patient remains depressed, dysphoric, vaguely irritable . States, however, he feels medication is starting to help. Denies plan or intention of suicide or of hurting self and contracts for safety on unit. At this time is not presenting with any symptoms of alcohol withdrawal- no tremors, no diaphoresis, no restlessness  Has tended to isolate in room, with limited milieu interaction or group participation . Principal Problem: MDD (major depressive disorder), recurrent severe, without psychosis ([place]) Diagnosis:   Patient Active Problem List   Diagnosis Date Noted  . Alcohol abuse with intoxication, uncomplicated ([place]) [W96.759] 07/31/2015  . MDD (major depressive disorder), recurrent severe, without psychosis ([place]) [F33.2] 07/31/2015  . Suicidal ideation [R45.851] 07/31/2015  . Homicidal ideation [R45.850] 07/31/2015  . Alcohol-induced mood disorder ([place]) [F10.94] 01/03/2015  . Alcohol use disorder, severe, dependence ([place]) [F10.20] 12/17/2014  . Alcohol abuse [F10.10] 08/07/2014   Total Time spent with patient: 20 minutes    Past Medical History:  Past Medical History:  Diagnosis Date  . Hypertension     Past Surgical History:  Procedure Laterality Date  . 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   .  TONSILLECTOMY     Family History: History reviewed. No pertinent family history.  Social History:  History  Alcohol Use  . 3.6 oz/week  . 6 Cans of beer per week    Comment: every other day      History  Drug Use No    Social History   Social History  . Marital status: Legally Separated    Spouse name: N/A  . Number of children: N/A  . Years of education: N/A   Social History Main Topics  . Smoking status: Current Some Day Smoker    Packs/day: 0.50    Types: Cigarettes  . Smokeless tobacco: None  . Alcohol use 3.6 oz/week    6 Cans of beer per week     Comment: every other day   . Drug use: No  . Sexual activity: Yes    Birth control/ protection: None   Other Topics Concern  . None   Social History Narrative   ** Merged History Encounter **       Additional Social History:   Sleep: Fair  Appetite:  Fair  Current Medications: Current Facility-Administered Medications  Medication Dose Route Frequency Provider Last Rate Last Dose  . acetaminophen (TYLENOL) tablet 650 mg  650 mg Oral Q6H PRN Lurena Nida, NP   650 mg at 08/03/15 1638  . DULoxetine (CYMBALTA) DR capsule 20 mg  20 mg Oral Daily Lurena Nida, NP   20 mg at 08/03/15 0841  . gabapentin (NEURONTIN) capsule 100 mg  100 mg Oral TID Lurena Nida, NP   100 mg at 08/03/15 1721  . hydrOXYzine (  ATARAX/VISTARIL) tablet 25 mg  25 mg Oral Q6H PRN Lurena Nida, NP   25 mg at 08/03/15 7672  . multivitamin with minerals tablet 1 tablet  1 tablet Oral Daily Lurena Nida, NP   1 tablet at 08/03/15 0841  . nicotine polacrilex (NICORETTE) gum 2 mg  2 mg Oral PRN Jenne Campus, MD      . thiamine (VITAMIN B-1) tablet 100 mg  100 mg Oral Daily Lurena Nida, NP   100 mg at 08/03/15 0841  . traZODone (DESYREL) tablet 100 mg  100 mg Oral QHS PRN Lurena Nida, NP   100 mg at 08/02/15 2121    Lab Results: No results found for this or any previous visit (from the past 48 hour(s)).  Blood Alcohol level:  Lab  Results  Component Value Date   ETH 370 (HH) 07/30/2015   ETH 41 (H) 09/47/0962    Metabolic Disorder Labs: Lab Results  Component Value Date   HGBA1C 5.3 08/01/2015   MPG 105 08/01/2015   Lab Results  Component Value Date   PROLACTIN 18.9 (H) 08/01/2015   Lab Results  Component Value Date   CHOL 184 08/01/2015   TRIG 125 08/01/2015   HDL 90 08/01/2015   CHOLHDL 2.0 08/01/2015   VLDL 25 08/01/2015   LDLCALC 69 08/01/2015    Physical Findings: AIMS: Facial and Oral Movements Muscles of Facial Expression: None, normal Lips and Perioral Area: None, normal Jaw: None, normal Tongue: None, normal,Extremity Movements Upper (arms, wrists, hands, fingers): None, normal Lower (legs, knees, ankles, toes): None, normal, Trunk Movements Neck, shoulders, hips: None, normal, Overall Severity Severity of abnormal movements (highest score from questions above): None, normal Incapacitation due to abnormal movements: None, normal Patient's awareness of abnormal movements (rate only patient's report): No Awareness, Dental Status Current problems with teeth and/or dentures?: Yes Does patient usually wear dentures?: No  CIWA:  CIWA-Ar Total: 3 COWS:  COWS Total Score: 7  Musculoskeletal: Strength & Muscle Tone: within normal limits- no tremors, no diaphoresis, no psychomotor restlessness  Gait & Station: normal Patient leans: N/A  Psychiatric Specialty Exam: Physical Exam  ROS describes feeling vaguely dizzy and nauseous, no chest pain, no shortness of breath  Blood pressure (!) 136/93, pulse 87, temperature 97.7 F (36.5 C), temperature source Oral, resp. rate 20, height 5' 10.5" (1.791 m), weight 172 lb (78 kg), SpO2 100 %.Body mass index is 24.33 kg/m.  General Appearance: Fairly Groomed  Eye Contact:  Fair  Speech:  Normal Rate  Volume:  Decreased  Mood:  remains depressed, less irritable today  Affect:  Constricted and vaguely irritable   Thought Process:  Linear   Orientation:  Other:  fully alert and attentive   Thought Content:  at this time denies hallucinations, no delusions expressed, does not appear internally preoccpied   Suicidal Thoughts:   Endorses some passive SI but  denies any current suicidal plan or intention and contracts for safety   Homicidal Thoughts:  No at this time denies any homicidal or violent ideations towards anyone, including family member he had been expressing HI towards on admission   Memory:  recent and remote grossly intact   Judgement:  Fair  Insight:  Fair  Psychomotor Activity:  Decreased  Concentration:  Concentration: Good and Attention Span: Good  Recall:  Good  Fund of Knowledge:  Good  Language:  Good  Akathisia:  Negative  Handed:  Right  AIMS (if indicated):  Assets:  Desire for Improvement Resilience  ADL's:  Intact  Cognition:  WNL  Sleep:  Number of Hours: 3.75   Assessment - patient remains depressed, dysphoric, vaguely irritable, endorsing some passive SI. At this time denying any plan or intention of hurting self and no longer expressing HI. He is future oriented, and wants to go to a residential rehab setting on discharge. Tolerating Cymbalta and Neurontin well thus far- no side effects . No current withdrawal symptoms    Treatment Plan Summary: Daily contact with patient to assess and evaluate symptoms and progress in treatment, Medication management, Plan inpatient treatment  and medications as below  Encourage increased group, milieu participation to continue working on coping skills and symptom reduction Encourage efforts to work on sobriety and relapse prevention Increase Cymbalta to 40 mgrs QDAY for depression, anxiety Increase Neurontin  To 200 mgrs BID for anxiety, pain Continue Trazodone 100 mgrs QHS PRN for insomnia Treatment team working on disposition planning, patient wants to go to a rehab setting on discharge  Neita Garnet, MD 08/03/2015, 7:06 PM

## 2015-08-03 NOTE — [facility] Group Notes (Signed)
[facility] Group Notes:  (Nursing/MHT/Case Management/Adjunct)  Date:  08/03/2015  Time:  6:15 PM    Summary of Progress/Problems: Pt did not attend group. Joeli Fenner R Ruthellen Tippy 08/03/2015, 6:15 PM

## 2015-08-03 NOTE — Tx Team (Addendum)
Initial Interdisciplinary Treatment Plan   PATIENT STRESSORS: Financial difficulties Health problems Marital or family conflict Occupational concerns   PATIENT STRENGTHS: Average or above average intelligence Capable of independent living Communication skills   PROBLEM LIST: Problem List/Patient Goals Date to be addressed Date deferred Reason deferred Estimated date of resolution  Depression 08/07/15     "I lost my job and my house and my sister died"      Suicide Ideation 08-07-2015      "I got a gun at my friends house to shoot myself"      Substance Abuse 08/07/2015      "I drink too much"                         DISCHARGE CRITERIA:  Ability to meet basic life and health needs Adequate post-discharge living arrangements Improved stabilization in mood, thinking, and/or behavior Medical problems require only outpatient monitoring  PRELIMINARY DISCHARGE PLAN: Attend 12-step recovery group Outpatient therapy  PATIENT/FAMIILY INVOLVEMENT: This treatment plan has been presented to and reviewed with the patient, Rodney Hartman .  The patient has been given the opportunity to ask questions and make suggestions.  *  TX information gained from quotes in notes and interaction with patient.  Sylvan Cheese 2015-08-07, 3:58 AM

## 2015-08-03 NOTE — Progress Notes (Signed)
Pt in dayroom at shift change.  Pt is assertive, anxiousgood eye contact and verbally coherent.  When getting evening meds pt asks if building just moved.  Pt not sure if he just imagined it. Pt sts he has feelings of passive SI but verbally contracts for safety.  Pt monitored for safety, medications given and support and encouragement given.  Pt remains safe on unit

## 2015-08-04 NOTE — Progress Notes (Signed)
Patient A/O, no noted distress. Denies SI/HI. Patient noted to be withdrawn. C/o pain. Administered prn regimen for pain and insomnia. Patient noted he will attend outpatient treatment when discharge. Staff will continue to monitor, maintain safety, and meet patient needs.

## 2015-08-04 NOTE — Progress Notes (Signed)
D: Pt presents with flat affect and depressed mood. Pt rates depression 6/10. Anxiety 6/10. Hopeless 5/10. Pt reports passive suicidal thoughts with no plan or intent. Pt verbally contracts for safety. Pt easily agitated and irritable when encouraged to attend groups. Pt reports withdrawal symptoms of tremors, chills, cramping, and irritability. Pt requested Robaxin for chronic back pain. MD made aware during treatment team of pt request. A: Medications reviewed with pt. Medications administered as ordered per MD. Verbal support provided. Pt encouraged to attend groups. 15 minute checks performed for safety.  R: Pt stated goal is to work on his attitude. Pt receptive to treatment.
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
## 2015-08-04 NOTE — Progress Notes (Signed)
Adult Psychoeducational Group Note  Date:  08/04/2015 Time:  12:11 AM  Group Topic/Focus:  Wrap-Up Group:   The focus of this group is to help patients review their daily goal of treatment and discuss progress on daily workbooks.   Participation Level:  None  Participation Quality:  Did not attend group  Affect:  Did not attend group  Cognitive:  Did not attend group  Insight: None  Engagement in Group:  Did not attended group  Modes of Intervention:  Did not attend group   Additional Comments:  Patient did not attend group. Earlee Herald W Ed Mandich 08/04/2015, 12:11 AM

## 2015-08-04 NOTE — [facility] Group Notes (Signed)
Adult Psychoeducational Group Note  Date:  08/04/2015 Time:  0945  Group Topic/Focus:  Goals Group:   The focus of this group is to help patients establish daily goals to achieve during treatment and discuss how the patient can incorporate goal setting into their daily lives to aide in recovery.  Also had patients identify at least one strength or ability.   Participation Level:  Patient attended but did not have an opportunity to share.  Was offered opportunity to check in with his nurse and share after AA group, patient was receptive to this suggestion.  He kept to self and was respective of peers during group time.  Antoine Primas, RN 08/04/2015, 10:47 AM

## 2015-08-04 NOTE — Progress Notes (Signed)
Adult Psychoeducational Group Note  Date:  08/04/2015 Time:  12:44 AM  Group Topic/Focus:  Wrap-Up Group:   The focus of this group is to help patients review their daily goal of treatment and discuss progress on daily workbooks.   Participation Level:  Did Not Attend  Participation Quality:  Did not attend   Affect:  Did not attend  Cognitive:  Did not attend   Insight: None  Engagement in Group:  Did not attend   Modes of Intervention:  Did not attend  Additional Comments:  Patient did not attend NA group. Janeliz Prestwood W Fleeta Kunde 08/04/2015, 12:44 AM

## 2015-08-04 NOTE — Progress Notes (Signed)
Pt was observed at the beginning of the shift in his room in bed.  He responded  to his name when called speak to writer about his day.  Conversation was minimal and pt reported no needs or concerns at this time.  He did not attend evening wrap up group.  He did get up afterwards and go to the dayroom for a snack and stayed to watch TV for a little while.  Pt denies HI/AVH, but says he still has passive suicidal thoughts that come and go.  He contracts for Actor.  He denies any withdrawal symptoms at this time.  Pt would like to go for long term treatment after detox.  Support and encouragement offered.  Pt makes his needs known to staff.  Discharge plans are in process.  Safety maintained with q15 minute checks.

## 2015-08-04 NOTE — Progress Notes (Signed)
Patient did attend the evening karaoke group. Pt was engaged and supportive but did not participate by singing a song.    

## 2015-08-05 MED ORDER — IBUPROFEN 600 MG PO TABS
600.0000 mg | ORAL_TABLET | Freq: Four times a day (QID) | ORAL | Status: DC | PRN
  Administered 2015-08-05 – 2015-08-09 (×4): 600 mg via ORAL
  Filled 2015-08-05 (×5): qty 1

## 2015-08-05 MED ORDER — DICLOFENAC SODIUM 1 % TD GEL
4.0000 g | Freq: Four times a day (QID) | TRANSDERMAL | Status: DC
  Administered 2015-08-05 – 2015-08-08 (×8): 4 g via TOPICAL
  Filled 2015-08-05 (×2): qty 100

## 2015-08-05 MED ORDER — GABAPENTIN 300 MG PO CAPS
300.0000 mg | ORAL_CAPSULE | Freq: Two times a day (BID) | ORAL | Status: DC
  Administered 2015-08-05 – 2015-08-09 (×8): 300 mg via ORAL
  Filled 2015-08-05: qty 28
  Filled 2015-08-05 (×7): qty 1
  Filled 2015-08-05: qty 28
  Filled 2015-08-05: qty 1

## 2015-08-05 NOTE — Progress Notes (Signed)
Patient ID: Rodney Hartman, male   DOB: 1960-06-20, 55 y.o.   MRN: [id number] [facility] MD Progress Note  08/05/2015 4:13 PM Rodney Hartman  MRN:  [id number] Subjective:  Patient states that he still feels low at times and irritated by certain patient around him.  He c/oback pain  Objective : I have discussed case with treatment team and have met with patient . Patient seen in milieu and in group.  Quiet and keep to self.  Per nursing, he is less isolative.  Reports some persistent passive SI, but denies any plan or intention of hurting self or of suicide and contracts for safety.  Denies medication side effects and does feel medications are helping, albeit partially   Principal Problem: MDD (major depressive disorder), recurrent severe, without psychosis ([place]) Diagnosis:   Patient Active Problem List   Diagnosis Date Noted  . Alcohol abuse with intoxication, uncomplicated ([place]) [N00.370] 07/31/2015  . MDD (major depressive disorder), recurrent severe, without psychosis ([place]) [F33.2] 07/31/2015  . Suicidal ideation [R45.851] 07/31/2015  . Homicidal ideation [R45.850] 07/31/2015  . Alcohol-induced mood disorder ([place]) [F10.94] 01/03/2015  . Alcohol use disorder, severe, dependence ([place]) [F10.20] 12/17/2014  . Alcohol abuse [F10.10] 08/07/2014   Total Time spent with patient: 20 minutes    Past Medical History:  Past Medical History:  Diagnosis Date  . Hypertension     Past Surgical History:  Procedure Laterality Date  . 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   . TONSILLECTOMY     Family History: History reviewed. No pertinent family history.  Social History:  History  Alcohol Use  . 3.6 oz/week  . 6 Cans of beer per week    Comment: every other day      History  Drug Use No    Social History   Social History  . Marital status: Legally Separated    Spouse name: N/A  . Number of children: N/A  .  Years of education: N/A   Social History Main Topics  . Smoking status: Current Some Day Smoker    Packs/day: 0.50    Types: Cigarettes  . Smokeless tobacco: None  . Alcohol use 3.6 oz/week    6 Cans of beer per week     Comment: every other day   . Drug use: No  . Sexual activity: Yes    Birth control/ protection: None   Other Topics Concern  . None   Social History Narrative   ** Merged History Encounter **       Additional Social History:   Sleep: improved   Appetite:  Fair  Current Medications: Current Facility-Administered Medications  Medication Dose Route Frequency Provider Last Rate Last Dose  . acetaminophen (TYLENOL) tablet 650 mg  650 mg Oral Q6H PRN Lurena Nida, NP   650 mg at 08/05/15 0834  . DULoxetine (CYMBALTA) DR capsule 40 mg  40 mg Oral Daily Jenne Campus, MD   40 mg at 08/05/15 0830  . gabapentin (NEURONTIN) capsule 200 mg  200 mg Oral BID Jenne Campus, MD   200 mg at 08/05/15 0830  . hydrOXYzine (ATARAX/VISTARIL) tablet 25 mg  25 mg Oral Q6H PRN Lurena Nida, NP   25 mg at 08/05/15 0834  . ibuprofen (ADVIL,MOTRIN) tablet 600 mg  600 mg Oral Q6H PRN Kerrie Buffalo, NP   600 mg at 08/05/15 1502  .  multivitamin with minerals tablet 1 tablet  1 tablet Oral Daily Lurena Nida, NP   1 tablet at 08/05/15 0830  . nicotine polacrilex (NICORETTE) gum 2 mg  2 mg Oral PRN Jenne Campus, MD      . thiamine (VITAMIN B-1) tablet 100 mg  100 mg Oral Daily Lurena Nida, NP   100 mg at 08/05/15 0830  . traZODone (DESYREL) tablet 100 mg  100 mg Oral QHS PRN Lurena Nida, NP   100 mg at 08/04/15 2209    Lab Results: No results found for this or any previous visit (from the past 48 hour(s)).  Blood Alcohol level:  Lab Results  Component Value Date   ETH 370 (HH) 07/30/2015   ETH 41 (H) 81/44/8185    Metabolic Disorder Labs: Lab Results  Component Value Date   HGBA1C 5.3 08/01/2015   MPG 105 08/01/2015   Lab Results  Component Value Date    PROLACTIN 18.9 (H) 08/01/2015   Lab Results  Component Value Date   CHOL 184 08/01/2015   TRIG 125 08/01/2015   HDL 90 08/01/2015   CHOLHDL 2.0 08/01/2015   VLDL 25 08/01/2015   LDLCALC 69 08/01/2015    Physical Findings: AIMS: Facial and Oral Movements Muscles of Facial Expression: None, normal Lips and Perioral Area: None, normal Jaw: None, normal Tongue: None, normal,Extremity Movements Upper (arms, wrists, hands, fingers): None, normal Lower (legs, knees, ankles, toes): None, normal, Trunk Movements Neck, shoulders, hips: None, normal, Overall Severity Severity of abnormal movements (highest score from questions above): None, normal Incapacitation due to abnormal movements: None, normal Patient's awareness of abnormal movements (rate only patient's report): No Awareness, Dental Status Current problems with teeth and/or dentures?: Yes Does patient usually wear dentures?: No  CIWA:  CIWA-Ar Total: 0 COWS:  COWS Total Score: 7  Musculoskeletal: Strength & Muscle Tone: within normal limits- no tremors, no diaphoresis, no psychomotor restlessness  Gait & Station: normal Patient leans: N/A  Psychiatric Specialty Exam: Physical Exam  ROS describes feeling vaguely dizzy and nauseous, no chest pain, no shortness of breath  Blood pressure 125/88, pulse 75, temperature 97.5 F (36.4 C), temperature source Oral, resp. rate 18, height 5' 10.5" (1.791 m), weight 78 kg (172 lb), SpO2 100 %.Body mass index is 24.33 kg/m.  General Appearance: Fairly Groomed  Eye Contact:  Improved   Speech:  Normal Rate  Volume:  Decreased  Mood:  States he is feeling better, but still depressed, presents depressed and irritable   Affect:  Irritable , remains constricted   Thought Process:  Linear  Orientation:  Other:  fully alert and attentive   Thought Content:  at this time denies hallucinations, no delusions expressed, does not appear internally preoccpied   Suicidal Thoughts:   Endorses some  passive SI but  denies any current suicidal plan or intention and contracts for safety   Homicidal Thoughts:describes vague violent ideations towards specific family member, as above.  Memory:  recent and remote grossly intact   Judgement:  Fair  Insight:  Fair  Psychomotor Activity:  Improving   Concentration:  Concentration: Good and Attention Span: Good  Recall:  Good  Fund of Knowledge:  Good  Language:  Good  Akathisia:  Negative  Handed:  Right  AIMS (if indicated):     Assets:  Desire for Improvement Resilience  ADL's:  Intact  Cognition:  WNL  Sleep:  Number of Hours: 5.75   Assessment - patient remains depressed ,  dysphoric, irritable in affect, although reports he is starting to feel better, and feels medications are helping . He is more visible on unit today, in day room, less isolative. Continues to endorse some passive SI, and vague HI/violent ideations, but behavior on unit in good control and contracts for safety. He is also future oriented, invested in going to [place] ( residential rehab setting ) on discharge No current symptoms of alcohol withdrawal and vitals stable.   Treatment Plan Summary: Daily contact with patient to assess and evaluate symptoms and progress in treatment, Medication management, Plan inpatient treatment  and medications as below  Encourage increased group, milieu participation to continue working on coping skills and symptom reduction Encourage efforts to work on sobriety and relapse prevention Continue Cymbalta 40 mgrs QDAY for depression, anxiety Increased Neurontin  To 300 mgrs BID for anxiety, pain Added Voltaren gel 4G to be applied to back Continue Trazodone 100 mgrs QHS PRN for insomnia Treatment team working on disposition planning, patient wants to go to a rehab setting on discharge   Sheila May Agustin, NP [facility] 08/05/2015, 4:13 PM   Agree with NP progress note as above

## 2015-08-05 NOTE — Progress Notes (Signed)
D: Patient denies SI/HI and A/V hallucinations; patient reports sleep was good and that he had some medication to assist; reports appetite is good; reports energy level is normal ;  rates depression as 5/10; rates hopelessness 6/10; rates anxiety as 5/10; patient complains of back pain of 9/10 ( see flowsheet)  A: Monitored q 15 minutes; patient encouraged to attend groups; patient educated about medications; patient given medications per physician orders; patient encouraged to express feelings and/or concerns  R: Patient is irritable but cooperative; patient is isolative; patient engages with peers very minimally;patient was able to set goal to talk with staff 1:1 when having feelings of SI; patient is taking medications as prescribed and tolerating medications; patient is attending some groups

## 2015-08-05 NOTE — [facility] Group Notes (Signed)
[facility] LCSW Group Therapy  08/05/2015 8:55 PM  Type of Therapy:  Group Therapy  Participation Level:  Minimal  Participation Quality:  Inattentive and Resistant  Affect:  Irritable  Cognitive:  Oriented  Insight:  Poor  Engagement in Therapy:  Limited  Modes of Intervention:  Discussion, Education, Exploration, Problem-solving, Rapport Building, Socialization and Support  Summary of Progress/Problems: Feelings around Relapse. Group members discussed the meaning of relapse and shared personal stories of relapse, how it affected them and others, and how they perceived themselves during this time. Group members were encouraged to identify triggers, warning signs and coping skills used when facing the possibility of relapse. Social supports were discussed and explored in detail. Rodney Hartman was inattentive and disengaged during today's processing group. He did not participate in group discussion. When prompted by CSW, pt became agitated and complained that he wanted to go to [facility] from the hospital, not [facility]. He was rude to other patients at times and demonstrated irritable mood with limited insight. At this time, pt does not appear to be making progress in the group setting.   Smart, Lynnette Pote LCSW 08/05/2015, 8:55 PM
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
## 2015-08-06 MED ORDER — TRAZODONE HCL 150 MG PO TABS
150.0000 mg | ORAL_TABLET | Freq: Every evening | ORAL | Status: DC | PRN
  Administered 2015-08-06 – 2015-08-08 (×3): 150 mg via ORAL
  Filled 2015-08-06 (×6): qty 1

## 2015-08-06 NOTE — Progress Notes (Signed)
[facility] MD Progress Note  08/06/2015 4:24 PM Rodney [facility]  MRN:  [id number]   Subjective:  Patient states " I am having a hard time staying asleep."  Objective: [name] is awake, alert and oriented X4. Seen resting in the dayroom interacting with peers. Denies suicidal or homicidal ideation. Denies auditory or visual hallucination and does not appear to be responding to internal stimuli. However patient reports passive thoughts for hoplessness and depression. Patient reports he is medication compliant without mediation side effects. States his depression 8/10.Patient reports he is going to [facility] for residential treatment and feels ready to go. Reports good appetite and resting well. Support, encouragement and reassurance was provided.   Principal Problem: MDD (major depressive disorder), recurrent severe, without psychosis (HCC) Diagnosis:   Patient Active Problem List   Diagnosis Date Noted  . Alcohol abuse with intoxication, uncomplicated (HCC) [F10.120] 07/31/2015  . MDD (major depressive disorder), recurrent severe, without psychosis (HCC) [F33.2] 07/31/2015  . Suicidal ideation [R45.851] 07/31/2015  . Homicidal ideation [R45.850] 07/31/2015  . Alcohol-induced mood disorder (HCC) [F10.94] 01/03/2015  . Alcohol use disorder, severe, dependence (HCC) [F10.20] 12/17/2014  . Alcohol abuse [F10.10] 08/07/2014   Total Time spent with patient: 20 minutes    Past Medical History:  Past Medical History:  Diagnosis Date  . Hypertension     Past Surgical History:  Procedure Laterality Date  . 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   . TONSILLECTOMY     Family History: History reviewed. No pertinent family history.  Social History:  History  Alcohol Use  . 3.6 oz/week  . 6 Cans of beer per week    Comment: every other day      History  Drug Use No    Social History   Social History   . Marital status: Legally Separated    Spouse name: N/A  . Number of children: N/A  . Years of education: N/A   Social History Main Topics  . Smoking status: Current Some Day Smoker    Packs/day: 0.50    Types: Cigarettes  . Smokeless tobacco: None  . Alcohol use 3.6 oz/week    6 Cans of beer per week     Comment: every other day   . Drug use: No  . Sexual activity: Yes    Birth control/ protection: None   Other Topics Concern  . None   Social History Narrative   ** Merged History Encounter **       Additional Social History:   Sleep: improved   Appetite:  Fair  Current Medications: Current Facility-Administered Medications  Medication Dose Route Frequency Provider Last Rate Last Dose  . acetaminophen (TYLENOL) tablet 650 mg  650 mg Oral Q6H PRN Kristeen Mans, NP   650 mg at 08/05/15 2251  . diclofenac sodium (VOLTAREN) 1 % transdermal gel 4 g  4 g Topical QID Adonis Brook, NP   4 g at 08/06/15 0817  . DULoxetine (CYMBALTA) DR capsule 40 mg  40 mg Oral Daily Craige Cotta, MD   40 mg at 08/06/15 0817  . gabapentin (NEURONTIN) capsule 300 mg  300 mg Oral BID Adonis Brook, NP   300 mg at 08/06/15 0817  . hydrOXYzine (ATARAX/VISTARIL) tablet 25 mg  25 mg Oral Q6H PRN Kristeen Mans, NP   25 mg at 08/06/15 0817  .  ibuprofen (ADVIL,MOTRIN) tablet 600 mg  600 mg Oral Q6H PRN Adonis Brook, NP   600 mg at 08/05/15 1502  . multivitamin with minerals tablet 1 tablet  1 tablet Oral Daily Kristeen Mans, NP   1 tablet at 08/06/15 0817  . nicotine polacrilex (NICORETTE) gum 2 mg  2 mg Oral PRN Craige Cotta, MD   2 mg at 08/06/15 0818  . thiamine (VITAMIN B-1) tablet 100 mg  100 mg Oral Daily Kristeen Mans, NP   100 mg at 08/06/15 0817  . traZODone (DESYREL) tablet 150 mg  150 mg Oral QHS PRN Oneta Rack, NP        Lab Results: No results found for this or any previous visit (from the past 48 hour(s)).  Blood Alcohol level:  Lab Results  Component Value Date   ETH  370 (HH) 07/30/2015   ETH 41 (H) 01/24/2015    Metabolic Disorder Labs: Lab Results  Component Value Date   HGBA1C 5.3 08/01/2015   MPG 105 08/01/2015   Lab Results  Component Value Date   PROLACTIN 18.9 (H) 08/01/2015   Lab Results  Component Value Date   CHOL 184 08/01/2015   TRIG 125 08/01/2015   HDL 90 08/01/2015   CHOLHDL 2.0 08/01/2015   VLDL 25 08/01/2015   LDLCALC 69 08/01/2015    Physical Findings: AIMS: Facial and Oral Movements Muscles of Facial Expression: None, normal Lips and Perioral Area: None, normal Jaw: None, normal Tongue: None, normal,Extremity Movements Upper (arms, wrists, hands, fingers): None, normal Lower (legs, knees, ankles, toes): None, normal, Trunk Movements Neck, shoulders, hips: None, normal, Overall Severity Severity of abnormal movements (highest score from questions above): None, normal Incapacitation due to abnormal movements: None, normal Patient's awareness of abnormal movements (rate only patient's report): No Awareness, Dental Status Current problems with teeth and/or dentures?: Yes Does patient usually wear dentures?: No  CIWA:  CIWA-Ar Total: 4 COWS:  COWS Total Score: 7  Musculoskeletal: Strength & Muscle Tone: within normal limits- no tremors, no diaphoresis, no psychomotor restlessness  Gait & Station: normal Patient leans: N/A  Psychiatric Specialty Exam: Physical Exam  Nursing note and vitals reviewed. Constitutional: He is oriented to person, place, and time. He appears well-developed.  HENT:  Head: Normocephalic.  Neck: Normal range of motion.  Cardiovascular: Normal rate.   Neurological: He is alert and oriented to person, place, and time.  Psychiatric: He has a normal mood and affect. His behavior is normal.    Review of Systems  Psychiatric/Behavioral: Positive for depression and substance abuse. Negative for hallucinations and suicidal ideas. The patient is nervous/anxious and has insomnia.    describes  feeling vaguely dizzy and nauseous, no chest pain, no shortness of breath  Blood pressure 135/80, pulse 66, temperature 97.8 F (36.6 C), resp. rate 18, height 5' 10.5" (1.791 m), weight 78 kg (172 lb), SpO2 100 %.Body mass index is 24.33 kg/m.  General Appearance: Fairly Groomed  Eye Contact:  Improved   Speech:  Normal Rate  Volume:  Decreased  Mood:  States he is feeling better, but still depressed, presents depressed and irritable   Affect:  Irritable , remains constricted   Thought Process:  Linear  Orientation:  Other:  fully alert and attentive   Thought Content:  at this time denies hallucinations, no delusions expressed, does not appear internally preoccpied   Suicidal Thoughts:   Endorses some passive SI but  denies any current suicidal plan or intention and  contracts for safety   Homicidal Thoughts:describes vague violent ideations towards specific family member  Memory:  recent and remote grossly intact   Judgement:  Fair  Insight:  Fair  Psychomotor Activity:  Improving   Concentration:  Concentration: Good and Attention Span: Good  Recall:  Good  Fund of Knowledge:  Good  Language:  Good  Akathisia:  Negative  Handed:  Right  AIMS (if indicated):     Assets:  Desire for Improvement Resilience  ADL's:  Intact  Cognition:  WNL  Sleep:  Number of Hours: 6     I agree with current treatment plan on 07/29//2017, Patient seen face-to-face for psychiatric evaluation follow-up, chart reviewed. Reviewed the information documented and agree with the treatment plan.  Treatment Plan Summary: Daily contact with patient to assess and evaluate symptoms and progress in treatment, Medication management, Plan inpatient treatment  and medications as below  Encourage increased group, milieu participation to continue working on coping skills and symptom reduction Encourage efforts to work on sobriety and relapse prevention Continue Cymbalta 40 mgrs QDAY for depression,  anxiety Increased Neurontin  To 300 mgrs BID for anxiety, pain Added Voltaren gel 4G to be applied to back Increased Trazodone 100 to 150 mgrs QHS PRN for insomnia Treatment team working on disposition planning, patient wants to go to a rehab setting on discharge   Oneta Rack, NP [facility] 08/06/2015, 4:24 PM   Patient seen face-to-face for psychiatric evaluation, chart reviewed and case discussed with the physician extender and developed treatment plan. Reviewed the information documented and agree with the treatment plan. Thedore Mins, MD

## 2015-08-06 NOTE — Progress Notes (Signed)
Patient ID: Rodney [facility], male   DOB: 1960-03-15, 55 y.o.   MRN: [id number]   D: Pt has been very agitated and irritable on the unit today. Pt reported that he was tired of the stuff going on on the unit. Pt reported that he felt like knocking someone out. This Clinical research associate spoke to patient regarding his comments and advised him to let it go, and to only focus on self. This Clinical research associate informed patient that she was available if he needed someone to talk to, and prior to hurting someone else. Pt reported that he would talk to staff before he reacted to the crazy. Pt reported that his depression was a 6, his hopelessness was a 5, and his anxiety was a 7. Pt reported that his goal for today was to be focused. Pt reported being negative SI/HI, no AH/VH noted. A: 15 min checks continued for patient safety. R: Pt safety maintained.

## 2015-08-06 NOTE — [facility] Group Notes (Signed)
[facility] Group Notes:  (Clinical Social Work)   11/06/2014     10:00-11:00AM  Summary of Progress/Problems:   In today's process group a decisional balance exercise was used to explore in depth the perceived benefits and costs of alcohol and drugs, as well as the  benefits and costs of replacing these with healthy coping skills.  Patients listed healthy and unhealthy coping techniques, determining with CSW guidance that unhealthy coping techniques work initially, but eventually become harmful.  Motivational Interviewing and the whiteboard were utilized for the exercises. We also introduced the concepts that are used in Cognitive-Behavioral Therapy.  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  He spoke up quite a bit in group in encouraging ways to others.  Type of Therapy:  Group Therapy - Process   Participation Level:  Active  Participation Quality:  Attentive and Supportive  Affect:  Appropriate  Cognitive:  Appropriate  Insight:  Engaged  Engagement in Therapy:  Engaged  Modes of Intervention:  Education, Motivational Interviewing  Ambrose Mantle, LCSW 08/06/2015, 12:49 PM

## 2015-08-06 NOTE — Progress Notes (Signed)
[facility] Group Notes:  (Nursing/MHT/Case Management/Adjunct)  Date:  08/06/2015  Time:  2045 Type of Therapy:  wrap up group  Participation Level:  Active  Participation Quality:  Appropriate, Attentive, Sharing and Supportive  Affect:  Appropriate  Cognitive:  Appropriate  Insight:  Improving  Engagement in Group:  Engaged  Modes of Intervention:  Clarification, Education and Support  Summary of Progress/Problems: Pt shared that he felt himself slipping and got a grip before things got too bad. Pt plans on discharging to [facility] and shared that he is grateful to be alive and for his 50 year old grandson.   Rodney [facility] S 08/06/2015, 10:00 PM

## 2015-08-06 NOTE — Progress Notes (Signed)
Writer has observed patient in the dayroom watching tv with no interaction with peers. Writer spoke with him 1:1 and he c/o back pain and received his scheduled medications and tylenol. He c/o the dayroom locked at 2300 and reports that it was too early and he would not be able to go to sleep that early. Writer informed him that he would need a good nights rest in order to attend groups during on tomorrow and he reported that he may not even attend groups. Support given and safety maintained on unit with 15 min checks.

## 2015-08-07 MED ORDER — DULOXETINE HCL 60 MG PO CPEP
60.0000 mg | ORAL_CAPSULE | Freq: Every day | ORAL | Status: DC
  Administered 2015-08-08 – 2015-08-09 (×2): 60 mg via ORAL
  Filled 2015-08-07: qty 1
  Filled 2015-08-07: qty 14
  Filled 2015-08-07: qty 1

## 2015-08-07 MED ORDER — LISINOPRIL 10 MG PO TABS
10.0000 mg | ORAL_TABLET | Freq: Every day | ORAL | Status: DC
  Administered 2015-08-07 – 2015-08-09 (×3): 10 mg via ORAL
  Filled 2015-08-07: qty 1
  Filled 2015-08-07: qty 14
  Filled 2015-08-07: qty 2
  Filled 2015-08-07 (×2): qty 1

## 2015-08-07 NOTE — Progress Notes (Signed)
Patient has been up in the dayroom watching tv and non interaction with peers. He reports that he didn't rest well last night and writer in formed him that his sleep medication dosage was increased. He was pleased and compliant with scheduled medication. Support given and safety maintained on unit.

## 2015-08-07 NOTE — Progress Notes (Signed)
Patient ID: Rodney [facility], male   DOB: April 12, 1960, 55 y.o.   MRN: [id number]  DAR: Pt. Denies HI and A/V Hallucinations. He reports passive SI but is able to contract for safety. He reports sleep is fair, appetite is fair, and concentration is poor. He rates depression 5/10, hopelessness 6/10, and anxety 5/10. Patient does report a headache this morning and received PRN Ibuprofen which proved effective. His BP is elevated at this time and NP Lewis is notified of this. Support and encouragement provided to the patient. Scheduled medications administered to patient per physician's orders. Patient is guarded and minimal at this time. He is mainly seen in the dayroom watching tv or playing cards. Q15 minute checks are maintained for safety.

## 2015-08-07 NOTE — [facility] Group Notes (Signed)
[facility] Group Notes:  (Clinical Social Work)  08/07/2015  10:00-11:00AM  Summary of Progress/Problems:   The main focus of today's process group was to   1)  discuss the importance of adding supports  2)  identify the patient's current unhealthy supports and plan how to handle them  3)  Discuss what supports are needed based on individual's decisional balance exercise  4)  Identify the patient's current healthy supports and plan what to add.  The patient expressed full comprehension of the concepts presented, and agreed that there is a need to add more supports.  The patient stated the hospital group of people present in the meeting and his oldest son are his healthy supports, while people who come into the room in an aggressive fashion are unhealthy for him.  Type of Therapy:  Process Group with Motivational Interviewing  Participation Level:  Active  Participation Quality:  Attentive and Sharing  Affect:  Appropriate  Cognitive:  Appropriate  Insight:  Developing/Improving  Engagement in Therapy:  Engaged  Modes of Intervention:   Education, Support and Processing, Activity  Ambrose Mantle, LCSW 08/07/2015

## 2015-08-07 NOTE — Progress Notes (Signed)
[facility] MD Progress Note  08/07/2015 2:58 PM Rodney Hartman  MRN:  [id number]   Subjective:  Patient reports passive ideations/thought  "not be here." patient denies plan or intent. States he ready to go to the [facility].   Objective: Rodney Hartman is awake, alert and oriented X4. Seen resting in the dayroom interacting with peers. Denies suicidal or homicidal ideation. Denies auditory or visual hallucination and does not appear to be responding to internal stimuli. However patient reports passive thoughts for hopelessness and depression 8/10. Patient reports he is medication compliant without mediation side effects. States his depression still  8/10.Patient reports he is going to [place] for residential treatment and feels ready to go. Reports good appetite and resting well. Support, encouragement and reassurance was provided.   Principal Problem: MDD (major depressive disorder), recurrent severe, without psychosis (HCC) Diagnosis:   Patient Active Problem List   Diagnosis Date Noted  . Alcohol abuse with intoxication, uncomplicated (HCC) [F10.120] 07/31/2015  . MDD (major depressive disorder), recurrent severe, without psychosis (HCC) [F33.2] 07/31/2015  . Suicidal ideation [R45.851] 07/31/2015  . Homicidal ideation [R45.850] 07/31/2015  . Alcohol-induced mood disorder (HCC) [F10.94] 01/03/2015  . Alcohol use disorder, severe, dependence (HCC) [F10.20] 12/17/2014  . Alcohol abuse [F10.10] 08/07/2014   Total Time spent with patient: 20 minutes    Past Medical History:  Past Medical History:  Diagnosis Date  . Hypertension     Past Surgical History:  Procedure Laterality Date  . 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   . TONSILLECTOMY     Family History: History reviewed. No pertinent family history.  Social History:  History  Alcohol Use  . 3.6 oz/week  . 6 Cans of beer per week     Comment: every other day      History  Drug Use No    Social History   Social History  . Marital status: Legally Separated    Spouse name: N/A  . Number of children: N/A  . Years of education: N/A   Social History Main Topics  . Smoking status: Current Some Day Smoker    Packs/day: 0.50    Types: Cigarettes  . Smokeless tobacco: None  . Alcohol use 3.6 oz/week    6 Cans of beer per week     Comment: every other day   . Drug use: No  . Sexual activity: Yes    Birth control/ protection: None   Other Topics Concern  . None   Social History Narrative   ** Merged History Encounter **       Additional Social History:   Sleep: improved   Appetite:  Fair  Current Medications: Current Facility-Administered Medications  Medication Dose Route Frequency Provider Last Rate Last Dose  . acetaminophen (TYLENOL) tablet 650 mg  650 mg Oral Q6H PRN Kristeen Mans, NP   650 mg at 08/07/15 0631  . diclofenac sodium (VOLTAREN) 1 % transdermal gel 4 g  4 g Topical QID Adonis Brook, NP   4 g at 08/07/15 0826  . DULoxetine (CYMBALTA) DR capsule 40 mg  40 mg Oral Daily Craige Cotta, MD   40 mg at 08/07/15 0824  . gabapentin (NEURONTIN) capsule 300 mg  300 mg Oral BID Adonis Brook, NP   300 mg at 08/07/15 0825  . hydrOXYzine (ATARAX/VISTARIL) tablet 25 mg  25 mg Oral  Q6H PRN Kristeen Mans, NP   25 mg at 08/06/15 0817  . ibuprofen (ADVIL,MOTRIN) tablet 600 mg  600 mg Oral Q6H PRN Adonis Brook, NP   600 mg at 08/07/15 0826  . lisinopril (PRINIVIL,ZESTRIL) tablet 10 mg  10 mg Oral Daily Oneta Rack, NP   10 mg at 08/07/15 1204  . multivitamin with minerals tablet 1 tablet  1 tablet Oral Daily Kristeen Mans, NP   1 tablet at 08/07/15 0825  . nicotine polacrilex (NICORETTE) gum 2 mg  2 mg Oral PRN Craige Cotta, MD   2 mg at 08/06/15 0818  . thiamine (VITAMIN B-1) tablet 100 mg  100 mg Oral Daily Kristeen Mans, NP   100 mg at 08/07/15 0825  . traZODone (DESYREL) tablet 150 mg   150 mg Oral QHS PRN Oneta Rack, NP   150 mg at 08/06/15 2216    Lab Results: No results found for this or any previous visit (from the past 48 hour(s)).  Blood Alcohol level:  Lab Results  Component Value Date   ETH 370 (HH) 07/30/2015   ETH 41 (H) 01/24/2015    Metabolic Disorder Labs: Lab Results  Component Value Date   HGBA1C 5.3 08/01/2015   MPG 105 08/01/2015   Lab Results  Component Value Date   PROLACTIN 18.9 (H) 08/01/2015   Lab Results  Component Value Date   CHOL 184 08/01/2015   TRIG 125 08/01/2015   HDL 90 08/01/2015   CHOLHDL 2.0 08/01/2015   VLDL 25 08/01/2015   LDLCALC 69 08/01/2015    Physical Findings: AIMS: Facial and Oral Movements Muscles of Facial Expression: None, normal Lips and Perioral Area: None, normal Jaw: None, normal Tongue: None, normal,Extremity Movements Upper (arms, wrists, hands, fingers): None, normal Lower (legs, knees, ankles, toes): None, normal, Trunk Movements Neck, shoulders, hips: None, normal, Overall Severity Severity of abnormal movements (highest score from questions above): None, normal Incapacitation due to abnormal movements: None, normal Patient's awareness of abnormal movements (rate only patient's report): No Awareness, Dental Status Current problems with teeth and/or dentures?: Yes Does patient usually wear dentures?: No  CIWA:  CIWA-Ar Total: 0 COWS:  COWS Total Score: 7  Musculoskeletal: Strength & Muscle Tone: within normal limits- no tremors, no diaphoresis, no psychomotor restlessness  Gait & Station: normal Patient leans: N/A  Psychiatric Specialty Exam: Physical Exam  Nursing note and vitals reviewed. Constitutional: He is oriented to person, place, and time. He appears well-developed.  HENT:  Head: Normocephalic.  Neck: Normal range of motion.  Cardiovascular: Normal rate.   Neurological: He is alert and oriented to person, place, and time.  Psychiatric: He has a normal mood and affect.  His behavior is normal.    Review of Systems  Psychiatric/Behavioral: Positive for depression and substance abuse. Negative for hallucinations and suicidal ideas. The patient is nervous/anxious and has insomnia.    describes feeling vaguely dizzy and nauseous, no chest pain, no shortness of breath  Blood pressure (!) 156/83, pulse 68, temperature 97.8 F (36.6 C), temperature source Oral, resp. rate 16, height 5' 10.5" (1.791 m), weight 78 kg (172 lb), SpO2 100 %.Body mass index is 24.33 kg/m.  General Appearance: Fairly Groomed  Eye Contact:  Improved   Speech:  Normal Rate  Volume:  Decreased  Mood:  States he is feeling better, but still depressed, presents depressed and irritable   Affect:  Irritable , remains constricted   Thought Process:  Linear  Orientation:  Other:  fully alert and attentive   Thought Content:  at this time denies hallucinations, no delusions expressed, does not appear internally preoccpied   Suicidal Thoughts:   Endorses some passive SI but  denies any current suicidal plan or intention and contracts for safety   Homicidal Thoughts:describes vague violent ideations towards specific family member  Memory:  recent and remote grossly intact   Judgement:  Fair  Insight:  Fair  Psychomotor Activity:  Improving   Concentration:  Concentration: Good and Attention Span: Good  Recall:  Good  Fund of Knowledge:  Good  Language:  Good  Akathisia:  Negative  Handed:  Right  AIMS (if indicated):     Assets:  Desire for Improvement Resilience  ADL's:  Intact  Cognition:  WNL  Sleep:  Number of Hours: 5.25     I agree with current treatment plan on 07/30//2017, Patient seen face-to-face for psychiatric evaluation follow-up, chart reviewed. Reviewed the information documented and agree with the treatment plan.  Treatment Plan Summary: Daily contact with patient to assess and evaluate symptoms and progress in treatment, Medication management, Plan inpatient treatment   and medications as below  Encourage increased group, milieu participation to continue working on coping skills and symptom reduction Restarted Lisinopril 10 mg PO QD for HTN Encourage efforts to work on sobriety and relapse prevention Increased Cymbalta 40mg  to 60 mgrs QDAY for depression, anxiety Continue Neurontin 300 mgrs BID for anxiety, pain Added Voltaren gel 4G to be applied to back Continue Trazodone 150 mgrs QHS PRN for insomnia Treatment team working on disposition planning, patient wants to go to a rehab setting on discharge   Oneta Rack, NP [facility] 08/07/2015, 2:58 PM   Patient seen face-to-face for psychiatric evaluation, chart reviewed and case discussed with the physician extender and developed treatment plan. Reviewed the information documented and agree with the treatment plan. Thedore Mins, MD

## 2015-08-07 NOTE — Progress Notes (Signed)
Patient did attend the evening speaker AA meeting.  

## 2015-08-08 MED ORDER — DICLOFENAC SODIUM 1 % TD GEL: 4.0000 g | Freq: Four times a day (QID) | TRANSDERMAL | Status: DC

## 2015-08-08 MED ORDER — NICOTINE 21 MG/24HR TD PT24
21.0000 mg | MEDICATED_PATCH | Freq: Every day | TRANSDERMAL | Status: DC
  Filled 2015-08-08 (×2): qty 14

## 2015-08-08 MED ORDER — HYDROXYZINE HCL 25 MG PO TABS: 25.0000 mg | ORAL_TABLET | Freq: Four times a day (QID) | ORAL | 0 refills | Status: DC | PRN

## 2015-08-08 MED ORDER — NICOTINE 21 MG/24HR TD PT24: 21.0000 mg | MEDICATED_PATCH | Freq: Every day | TRANSDERMAL | 0 refills | Status: DC

## 2015-08-08 MED ORDER — LISINOPRIL 10 MG PO TABS: 10.0000 mg | ORAL_TABLET | Freq: Every day | ORAL | 0 refills | Status: DC

## 2015-08-08 MED ORDER — THIAMINE HCL 100 MG PO TABS: 100.0000 mg | ORAL_TABLET | Freq: Every day | ORAL | 0 refills | Status: DC

## 2015-08-08 MED ORDER — ALUM & MAG HYDROXIDE-SIMETH 200-200-20 MG/5ML PO SUSP
30.0000 mL | ORAL | Status: DC | PRN
  Administered 2015-08-08 (×2): 30 mL via ORAL
  Filled 2015-08-08: qty 30

## 2015-08-08 MED ORDER — GABAPENTIN 300 MG PO CAPS: 300.0000 mg | ORAL_CAPSULE | Freq: Two times a day (BID) | ORAL | 0 refills | Status: DC

## 2015-08-08 MED ORDER — DULOXETINE HCL 60 MG PO CPEP: 60.0000 mg | ORAL_CAPSULE | Freq: Every day | ORAL | 0 refills | Status: DC

## 2015-08-08 MED ORDER — TRAZODONE HCL 150 MG PO TABS: 150.0000 mg | ORAL_TABLET | Freq: Every evening | ORAL | 0 refills | Status: DC | PRN
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
## 2015-08-08 NOTE — Progress Notes (Signed)
  [facility] Adult Case Management Discharge Plan :  Will you be returning to the same living situation after discharge:  No. Pt has screening for possible admission at 12pm on Tuesday 08/09/15.  At discharge, do you have transportation home?: Yes,  taxi voucher in chart--pt must discharge by 10:30AM on Tuesday, 08/09/15.  Do you have the ability to pay for your medications: Yes,  mental health  Release of information consent forms completed and submitted to medical records by CSW. Patient to Follow up at: Follow-up Information    [facility] .   Specialty:  Behavioral Health Why:  Walk in between 8am-9am Monday through Friday for hospital follow-up/medication management/assessment for counseling services.  Contact information: [address] [place] [address] [phone]        [facility] Follow up on 08/09/2015.   Why:  Screening for possible admission on this date at 12PM. Please bring Photo ID/Proof of [employer] address, if you have it--Social Security card, and clothing. Thank you.  Contact information: [address] [place] [address] [phone]        [facility] .   Why:  Referral faxed: 08/03/15. No Beds available at this time. If you are interested in this treatment facility, please continue to follow-up with [facility] in admissions daily to check waitlist and referral. Thank you.  Contact information: [address] [place], [place] [address] Phone: [phone] Fax: [phone]          Next level of care provider has access to [facility] Link:no  Safety Planning and Suicide Prevention discussed: Yes,  SPE completed with pt; pt declined to consent to family contact and no longer is endorsing HI/SI. "I would never actually do anything to anyone."   Have you used any form of tobacco in the last 30 days? (Cigarettes, Smokeless Tobacco, Cigars, and/or Pipes): Yes  Has patient been referred to the Quitline?: Patient refused  referral  Patient has been referred for addiction treatment: Yes  Smart, Phillipa Morden LCSW 08/08/2015, 10:21 AM

## 2015-08-08 NOTE — Progress Notes (Signed)
Pt reports he is doing ok.  He says he still has passive suicidal thoughts sometimes, but is able to contract for safety with staff.  Pt denies HI/AVH.  Pt denies having any withdrawal symptoms at this time.  Pt says that he plans to go to rehab after detox and then wants to go to a shelter.  He stays in the dayroom most of the time either watching TV or playing cards with peers.  He lets staff know if something angers him, and he is easily irritated.  For the most part, he is cooperative.  Support and encouragement offered.  Discharge plans are in process.  Safety maintained with q15 minute checks.

## 2015-08-08 NOTE — Progress Notes (Signed)
Recreation Therapy Notes  Date: 08/08/15 Time: 0930 Location: [address] Group Room  Group Topic: Stress Management  Goal Area(s) Addresses:  Patient will verbalize importance of using healthy stress management.  Patient will identify positive emotions associated with healthy stress management.    Intervention: Stress Management  Activity :  Healthy Boundaries Guided Imagery.  LRT introduced the technique of guided imagery to the patients.  Pt were to follow along as the LRT read the script to engaged in the activity.    Education:  Stress Management, Discharge Planning.    Clinical Observations/Feedback: Pt did not attend group.   Calisa Luckenbaugh, LRT/CTRS  

## 2015-08-08 NOTE — Progress Notes (Signed)
Patient ID: ANTWAN TRITTEN, male   DOB: 11-03-1960, 55 y.o.   MRN: [id number]  DAR: Pt. Denies SI/HI and A/V Hallucinations. He reports sleep is good, appetite is fair, energy level is normal, and concentration is good. He rates depression, anxiety, and hopelessness 6/10. Patient does not report any pain or discomfort at this time. Support and encouragement provided to the patient. Scheduled medications administered to patient per physician's orders. Patient is minimal but cooperative. He did not want to wake up this morning for scheduled medications therefore received them after scheduled times. He reports looking forward to discharge tomorrow morning. Q15 minute checks are maintained for safety.

## 2015-08-08 NOTE — [facility] Group Notes (Signed)
[facility] LCSW Group Therapy  08/08/2015 4:40 PM  Type of Therapy:  Group Therapy  Participation Level:  Active  Participation Quality:  Attentive  Affect:  Appropriate  Cognitive:  Alert and Oriented  Insight:  Improving  Engagement in Therapy:  Improving  Modes of Intervention:  Discussion, Education, Exploration, Problem-solving, Rapport Building, Socialization and Support  Summary of Progress/Problems: Today's Topic: Overcoming Obstacles. Patients identified one short term goal and potential obstacles in reaching this goal. Patients processed barriers involved in overcoming these obstacles. Patients identified steps necessary for overcoming these obstacles and explored motivation (internal and external) for facing these difficulties head on. Rodney Hartman was attentive and engaged during today's processing group. He identified no obstacles other than "getting into [facility] tomorrow." He reports that he has never gone there before for treatment and does not know what to expect. Patient reports that he feels mentally stable and "ready to move on to the next step."   Smart, Ripken Rekowski LCSW 08/08/2015, 4:40 PM
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
## 2015-08-09 NOTE — Progress Notes (Signed)
Adult Psychoeducational Group Note  Date:  08/09/2015 Time:  0845  Group Topic/Focus:  Orientation:   The focus of this group is to educate the patient on the purpose and policies of crisis stabilization and provide a format to answer questions about their admission.  The group details unit policies and expectations of patients while admitted.   Participation Level:  Did Not Attend  Participation Quality:    Affect:    Cognitive:    Insight:   Engagement in Group:    Modes of Intervention:    Additional Comments:   Neko Mcgeehan L 08/09/2015, 9:38 AM

## 2015-08-09 NOTE — Progress Notes (Signed)
Patient ID: Rodney Hartman, male   DOB: March 16, 1960, 55 y.o.   MRN: [id number]  Pt. Denies SI/HI and A/V hallucinations. Belongings returned to patient at time of discharge. Patient denies any pain or discomfort. Discharge instructions and medications were reviewed with patient. Patient verbalized understanding of both medications and discharge instructions. Writer contacted [employer] taxi service and they reported they were en route. He insisted to receive taxi voucher so he can "smoke." Patient was reminded that this is a smoke free environment including the area surrounding outside of hospital. Patient said he would walk off but will come back for taxi. Writer encouraged patient to stay so he does not miss his ride. He stated, "I'm not gonna miss the cab." Patient took taxi voucher and started towards the exit door. Patient is discharged from [facility] at this point. Q15 minute safety checks maintained until discharge. No distress upon discharge.

## 2015-08-09 NOTE — Progress Notes (Signed)
Pt has been in the dayroom all evening.  He reports he is doing well and is ready for discharge.  He denies SI/HI/AVH this evening.  He is no longer in detox and has an admission screening at [facility] tomorrow.  He reports he is ready to start the next part of his rehab, and then he will "go somewhere else from there".  He has been pleasant and cooperative with Clinical research associate this evening.  He makes his needs known to staff.  Support and encouragement offered.  Safety maintained with q15 minute checks.

## 2015-08-09 NOTE — Progress Notes (Signed)
[facility] Group Notes:  (Nursing/MHT/Case Management/Adjunct)  Date:  08/09/2015  Time:  2:55 AM  Type of Therapy:  Psychoeducational Skills  Participation Level:  Active  Participation Quality:  Appropriate  Affect:  Appropriate  Cognitive:  Appropriate  Insight:  Appropriate  Engagement in Group:  Engaged  Modes of Intervention:  Education  Summary of Progress/Problems: The patient stated that he had a good day and that he was able to connect with the people who were being discharged. In terms of the theme for the day, his wellness strategy will be to get more exercise and to try and "get more focused".   Hyla Coard S 08/09/2015, 2:55 AM

## 2015-08-12 ENCOUNTER — Emergency Department (HOSPITAL_BASED_OUTPATIENT_CLINIC_OR_DEPARTMENT_OTHER): Payer: Self-pay

## 2015-08-12 ENCOUNTER — Other Ambulatory Visit: Payer: Self-pay

## 2015-08-12 ENCOUNTER — Inpatient Hospital Stay (HOSPITAL_BASED_OUTPATIENT_CLINIC_OR_DEPARTMENT_OTHER)
Admission: EM | Admit: 2015-08-12 | Discharge: 2015-08-15 | DRG: 292 | Payer: Self-pay | Attending: Internal Medicine | Admitting: Internal Medicine

## 2015-08-12 ENCOUNTER — Encounter (HOSPITAL_BASED_OUTPATIENT_CLINIC_OR_DEPARTMENT_OTHER): Payer: Self-pay | Admitting: Emergency Medicine

## 2015-08-12 DIAGNOSIS — I5031 Acute diastolic (congestive) heart failure: Secondary | ICD-10-CM | POA: Diagnosis present

## 2015-08-12 DIAGNOSIS — R0789 Other chest pain: Secondary | ICD-10-CM

## 2015-08-12 DIAGNOSIS — F1010 Alcohol abuse, uncomplicated: Secondary | ICD-10-CM | POA: Diagnosis present

## 2015-08-12 DIAGNOSIS — I10 Essential (primary) hypertension: Secondary | ICD-10-CM | POA: Diagnosis present

## 2015-08-12 DIAGNOSIS — I110 Hypertensive heart disease with heart failure: Principal | ICD-10-CM | POA: Diagnosis present

## 2015-08-12 DIAGNOSIS — Z8711 Personal history of peptic ulcer disease: Secondary | ICD-10-CM

## 2015-08-12 DIAGNOSIS — I509 Heart failure, unspecified: Secondary | ICD-10-CM

## 2015-08-12 DIAGNOSIS — D638 Anemia in other chronic diseases classified elsewhere: Secondary | ICD-10-CM | POA: Diagnosis present

## 2015-08-12 DIAGNOSIS — D649 Anemia, unspecified: Secondary | ICD-10-CM | POA: Diagnosis present

## 2015-08-12 DIAGNOSIS — F17210 Nicotine dependence, cigarettes, uncomplicated: Secondary | ICD-10-CM | POA: Diagnosis present

## 2015-08-12 DIAGNOSIS — F332 Major depressive disorder, recurrent severe without psychotic features: Secondary | ICD-10-CM | POA: Diagnosis present

## 2015-08-12 HISTORY — DX: Depression, unspecified: F32.A

## 2015-08-12 HISTORY — DX: Peptic ulcer, site unspecified, unspecified as acute or chronic, without hemorrhage or perforation: K27.9

## 2015-08-12 HISTORY — DX: Alcohol abuse, uncomplicated: F10.10

## 2015-08-12 HISTORY — DX: Major depressive disorder, single episode, unspecified: F32.9

## 2015-08-12 LAB — COMPREHENSIVE METABOLIC PANEL
ALT: 36 U/L (ref 17–63)
ANION GAP: 6 (ref 5–15)
AST: 30 U/L (ref 15–41)
Albumin: 3.6 g/dL (ref 3.5–5.0)
Alkaline Phosphatase: 53 U/L (ref 38–126)
BILIRUBIN TOTAL: 0.7 mg/dL (ref 0.3–1.2)
BUN: 8 mg/dL (ref 6–20)
CO2: 30 mmol/L (ref 22–32)
Calcium: 9 mg/dL (ref 8.9–10.3)
Chloride: 104 mmol/L (ref 101–111)
Creatinine, Ser: 0.81 mg/dL (ref 0.61–1.24)
Glucose, Bld: 89 mg/dL (ref 65–99)
POTASSIUM: 4 mmol/L (ref 3.5–5.1)
Sodium: 140 mmol/L (ref 135–145)
TOTAL PROTEIN: 6.7 g/dL (ref 6.5–8.1)

## 2015-08-12 LAB — RETICULOCYTES
RBC.: 3.82 MIL/uL — AB (ref 4.22–5.81)
Retic Count, Absolute: 72.6 10*3/uL (ref 19.0–186.0)
Retic Ct Pct: 1.9 % (ref 0.4–3.1)

## 2015-08-12 LAB — TROPONIN I
Troponin I: <0.03 ng/mL (ref <0.03)
Troponin I: <0.03 ng/mL (ref <0.03)

## 2015-08-12 LAB — CBC WITH DIFFERENTIAL/PLATELET
Basophils Absolute: 0 10*3/uL (ref 0.0–0.1)
Basophils Relative: 1 %
EOS PCT: 4 %
Eosinophils Absolute: 0.2 10*3/uL (ref 0.0–0.7)
HEMATOCRIT: 34.6 % — AB (ref 39.0–52.0)
Hemoglobin: 11.6 g/dL — ABNORMAL LOW (ref 13.0–17.0)
LYMPHS PCT: 12 %
Lymphs Abs: 0.8 10*3/uL (ref 0.7–4.0)
MCH: 30.6 pg (ref 26.0–34.0)
MCHC: 33.5 g/dL (ref 30.0–36.0)
MCV: 91.3 fL (ref 78.0–100.0)
MONO ABS: 0.6 10*3/uL (ref 0.1–1.0)
MONOS PCT: 9 %
NEUTROS ABS: 4.8 10*3/uL (ref 1.7–7.7)
Neutrophils Relative %: 74 %
PLATELETS: 319 10*3/uL (ref 150–400)
RBC: 3.79 MIL/uL — ABNORMAL LOW (ref 4.22–5.81)
RDW: 14.2 % (ref 11.5–15.5)
WBC: 6.4 10*3/uL (ref 4.0–10.5)

## 2015-08-12 LAB — IRON AND TIBC
IRON: 24 ug/dL — AB (ref 45–182)
SATURATION RATIOS: 8 % — AB (ref 17.9–39.5)
TIBC: 300 ug/dL (ref 250–450)
UIBC: 276 ug/dL

## 2015-08-12 LAB — VITAMIN B12: VITAMIN B 12: 343 pg/mL (ref 180–914)

## 2015-08-12 LAB — FOLATE: FOLATE: 20.3 ng/mL (ref >5.9)

## 2015-08-12 LAB — FERRITIN: Ferritin: 100 ng/mL (ref 24–336)

## 2015-08-12 LAB — MAGNESIUM: MAGNESIUM: 1.8 mg/dL (ref 1.7–2.4)

## 2015-08-12 LAB — TSH: TSH: 1.791 u[IU]/mL (ref 0.350–4.500)

## 2015-08-12 LAB — BRAIN NATRIURETIC PEPTIDE: B NATRIURETIC PEPTIDE 5: 511.4 pg/mL — AB (ref 0.0–100.0)

## 2015-08-12 LAB — LIPASE, BLOOD: LIPASE: 23 U/L (ref 11–51)

## 2015-08-12 MED ORDER — ENOXAPARIN SODIUM 40 MG/0.4ML ~~LOC~~ SOLN
40.0000 mg | SUBCUTANEOUS | Status: DC
  Administered 2015-08-12 – 2015-08-14 (×3): 40 mg via SUBCUTANEOUS
  Filled 2015-08-12 (×3): qty 0.4

## 2015-08-12 MED ORDER — ADULT MULTIVITAMIN W/MINERALS CH
1.0000 | ORAL_TABLET | Freq: Every day | ORAL | Status: DC
  Administered 2015-08-12 – 2015-08-15 (×4): 1 via ORAL
  Filled 2015-08-12 (×4): qty 1

## 2015-08-12 MED ORDER — FUROSEMIDE 10 MG/ML IJ SOLN: 40.0000 mg | Freq: Two times a day (BID) | INTRAMUSCULAR | Status: DC

## 2015-08-12 MED ORDER — DOCUSATE SODIUM 100 MG PO CAPS
100.0000 mg | ORAL_CAPSULE | Freq: Two times a day (BID) | ORAL | Status: DC
  Administered 2015-08-12 – 2015-08-15 (×7): 100 mg via ORAL
  Filled 2015-08-12 (×6): qty 1

## 2015-08-12 MED ORDER — HYDRALAZINE HCL 20 MG/ML IJ SOLN
10.0000 mg | Freq: Once | INTRAMUSCULAR | Status: AC
  Administered 2015-08-12: 10 mg via INTRAVENOUS
  Filled 2015-08-12: qty 1

## 2015-08-12 MED ORDER — LORAZEPAM 2 MG/ML IJ SOLN: 1.0000 mg | Freq: Four times a day (QID) | INTRAMUSCULAR | Status: AC | PRN

## 2015-08-12 MED ORDER — SODIUM CHLORIDE 0.9% FLUSH: 3.0000 mL | INTRAVENOUS | Status: DC | PRN

## 2015-08-12 MED ORDER — LISINOPRIL 10 MG PO TABS
10.0000 mg | ORAL_TABLET | Freq: Every day | ORAL | Status: DC
  Administered 2015-08-12 – 2015-08-14 (×3): 10 mg via ORAL
  Filled 2015-08-12 (×3): qty 1

## 2015-08-12 MED ORDER — ALBUTEROL SULFATE (2.5 MG/3ML) 0.083% IN NEBU
5.0000 mg | INHALATION_SOLUTION | Freq: Once | RESPIRATORY_TRACT | Status: AC
  Administered 2015-08-12: 5 mg via RESPIRATORY_TRACT
  Filled 2015-08-12: qty 6

## 2015-08-12 MED ORDER — ACETAMINOPHEN 325 MG PO TABS
650.0000 mg | ORAL_TABLET | ORAL | Status: DC | PRN
  Administered 2015-08-12 – 2015-08-14 (×5): 650 mg via ORAL
  Filled 2015-08-12 (×5): qty 2

## 2015-08-12 MED ORDER — ASPIRIN EC 81 MG PO TBEC
81.0000 mg | DELAYED_RELEASE_TABLET | Freq: Every day | ORAL | Status: DC
  Administered 2015-08-12 – 2015-08-15 (×4): 81 mg via ORAL
  Filled 2015-08-12 (×4): qty 1

## 2015-08-12 MED ORDER — THIAMINE HCL 100 MG/ML IJ SOLN: 100.0000 mg | Freq: Every day | INTRAMUSCULAR | Status: DC

## 2015-08-12 MED ORDER — HALOPERIDOL 5 MG PO TABS: 5.0000 mg | ORAL_TABLET | Freq: Every day | ORAL | Status: DC

## 2015-08-12 MED ORDER — VITAMIN B-1 100 MG PO TABS
100.0000 mg | ORAL_TABLET | Freq: Every day | ORAL | Status: DC
  Administered 2015-08-12 – 2015-08-15 (×4): 100 mg via ORAL
  Filled 2015-08-12 (×3): qty 1

## 2015-08-12 MED ORDER — NICOTINE 21 MG/24HR TD PT24
21.0000 mg | MEDICATED_PATCH | Freq: Every day | TRANSDERMAL | Status: DC
  Administered 2015-08-12 – 2015-08-14 (×3): 21 mg via TRANSDERMAL
  Filled 2015-08-12 (×4): qty 1

## 2015-08-12 MED ORDER — LISINOPRIL 10 MG PO TABS
10.0000 mg | ORAL_TABLET | Freq: Every day | ORAL | Status: DC
  Administered 2015-08-12: 10 mg via ORAL
  Filled 2015-08-12: qty 1

## 2015-08-12 MED ORDER — METOPROLOL TARTRATE 5 MG/5ML IV SOLN
5.0000 mg | Freq: Three times a day (TID) | INTRAVENOUS | Status: DC
  Administered 2015-08-13: 5 mg via INTRAVENOUS
  Filled 2015-08-12 (×2): qty 5

## 2015-08-12 MED ORDER — LORAZEPAM 1 MG PO TABS
1.0000 mg | ORAL_TABLET | Freq: Four times a day (QID) | ORAL | Status: AC | PRN
  Administered 2015-08-12: 1 mg via ORAL

## 2015-08-12 MED ORDER — BENZTROPINE MESYLATE 1 MG PO TABS: 1.0000 mg | ORAL_TABLET | Freq: Every day | ORAL | Status: DC

## 2015-08-12 MED ORDER — NITROGLYCERIN 0.4 MG SL SUBL
0.4000 mg | SUBLINGUAL_TABLET | SUBLINGUAL | Status: AC | PRN
  Administered 2015-08-12 (×3): 0.4 mg via SUBLINGUAL
  Filled 2015-08-12 (×2): qty 1

## 2015-08-12 MED ORDER — FLUOXETINE HCL 10 MG PO CAPS: 10.0000 mg | ORAL_CAPSULE | Freq: Every day | ORAL | Status: DC

## 2015-08-12 MED ORDER — FOLIC ACID 1 MG PO TABS
1.0000 mg | ORAL_TABLET | Freq: Every day | ORAL | Status: DC
  Administered 2015-08-12 – 2015-08-15 (×4): 1 mg via ORAL
  Filled 2015-08-12 (×4): qty 1

## 2015-08-12 MED ORDER — SODIUM CHLORIDE 0.9% FLUSH
3.0000 mL | Freq: Two times a day (BID) | INTRAVENOUS | Status: DC
  Administered 2015-08-12 – 2015-08-14 (×6): 3 mL via INTRAVENOUS

## 2015-08-12 MED ORDER — ASPIRIN 81 MG PO CHEW
324.0000 mg | CHEWABLE_TABLET | Freq: Once | ORAL | Status: AC
  Administered 2015-08-12: 324 mg via ORAL
  Filled 2015-08-12: qty 4

## 2015-08-12 MED ORDER — DULOXETINE HCL 60 MG PO CPEP: 60.0000 mg | ORAL_CAPSULE | Freq: Every day | ORAL | Status: DC

## 2015-08-12 MED ORDER — GI COCKTAIL ~~LOC~~: 30.0000 mL | Freq: Two times a day (BID) | ORAL | Status: DC | PRN

## 2015-08-12 MED ORDER — FUROSEMIDE 10 MG/ML IJ SOLN
40.0000 mg | Freq: Two times a day (BID) | INTRAMUSCULAR | Status: DC
  Administered 2015-08-12 – 2015-08-13 (×2): 40 mg via INTRAVENOUS
  Filled 2015-08-12 (×2): qty 4

## 2015-08-12 MED ORDER — GABAPENTIN 300 MG PO CAPS
300.0000 mg | ORAL_CAPSULE | Freq: Two times a day (BID) | ORAL | Status: DC
  Administered 2015-08-12 – 2015-08-15 (×7): 300 mg via ORAL
  Filled 2015-08-12 (×7): qty 1

## 2015-08-12 MED ORDER — FUROSEMIDE 10 MG/ML IJ SOLN
20.0000 mg | Freq: Once | INTRAMUSCULAR | Status: AC
  Administered 2015-08-12: 20 mg via INTRAVENOUS
  Filled 2015-08-12: qty 2

## 2015-08-12 MED ORDER — TRAZODONE HCL 50 MG PO TABS
150.0000 mg | ORAL_TABLET | Freq: Every evening | ORAL | Status: DC | PRN
  Administered 2015-08-12 – 2015-08-14 (×3): 150 mg via ORAL
  Filled 2015-08-12 (×3): qty 1

## 2015-08-12 MED ORDER — LORAZEPAM 1 MG PO TABS
0.0000 mg | ORAL_TABLET | Freq: Two times a day (BID) | ORAL | Status: DC
Start: 2015-08-14 — End: 2015-08-15
  Administered 2015-08-14: 4 mg via ORAL
  Filled 2015-08-12: qty 4
  Filled 2015-08-12: qty 1

## 2015-08-12 MED ORDER — MORPHINE SULFATE (PF) 4 MG/ML IV SOLN
4.0000 mg | Freq: Once | INTRAVENOUS | Status: AC
Start: 2015-08-12 — End: 2015-08-12
  Administered 2015-08-12: 4 mg via INTRAVENOUS
  Filled 2015-08-12: qty 1

## 2015-08-12 MED ORDER — SODIUM CHLORIDE 0.9 % IV SOLN: 250.0000 mL | INTRAVENOUS | Status: DC | PRN

## 2015-08-12 MED ORDER — LORAZEPAM 1 MG PO TABS
0.0000 mg | ORAL_TABLET | Freq: Four times a day (QID) | ORAL | Status: AC
  Administered 2015-08-12: 2 mg via ORAL
  Administered 2015-08-13 – 2015-08-14 (×2): 1 mg via ORAL
  Filled 2015-08-12 (×2): qty 1
  Filled 2015-08-12 (×2): qty 2

## 2015-08-12 MED ORDER — ONDANSETRON HCL 4 MG/2ML IJ SOLN
4.0000 mg | Freq: Four times a day (QID) | INTRAMUSCULAR | Status: DC | PRN
  Administered 2015-08-12: 4 mg via INTRAVENOUS
  Filled 2015-08-12: qty 2

## 2015-08-12 MED ORDER — MORPHINE SULFATE (PF) 2 MG/ML IV SOLN
2.0000 mg | INTRAVENOUS | Status: DC | PRN
  Administered 2015-08-12 – 2015-08-14 (×2): 2 mg via INTRAVENOUS
  Filled 2015-08-12 (×3): qty 1

## 2015-08-12 MED ORDER — FERROUS SULFATE 325 (65 FE) MG PO TABS
325.0000 mg | ORAL_TABLET | Freq: Two times a day (BID) | ORAL | Status: DC
  Administered 2015-08-12 – 2015-08-15 (×6): 325 mg via ORAL
  Filled 2015-08-12 (×6): qty 1

## 2015-08-12 MED ORDER — DULOXETINE HCL 60 MG PO CPEP
60.0000 mg | ORAL_CAPSULE | Freq: Every day | ORAL | Status: DC
  Administered 2015-08-12 – 2015-08-15 (×4): 60 mg via ORAL
  Filled 2015-08-12 (×4): qty 1

## 2015-08-12 MED ORDER — PANTOPRAZOLE SODIUM 40 MG PO TBEC
40.0000 mg | DELAYED_RELEASE_TABLET | Freq: Every day | ORAL | Status: DC
  Administered 2015-08-12 – 2015-08-15 (×4): 40 mg via ORAL
  Filled 2015-08-12 (×3): qty 1

## 2015-08-12 MED ORDER — ACETAMINOPHEN 325 MG PO TABS
650.0000 mg | ORAL_TABLET | Freq: Once | ORAL | Status: AC
  Administered 2015-08-12: 650 mg via ORAL
  Filled 2015-08-12: qty 2

## 2015-08-12 MED ORDER — IPRATROPIUM BROMIDE 0.02 % IN SOLN
0.5000 mg | Freq: Once | RESPIRATORY_TRACT | Status: AC
  Administered 2015-08-12: 0.5 mg via RESPIRATORY_TRACT
  Filled 2015-08-12: qty 2.5

## 2015-08-12 NOTE — ED Notes (Signed)
Patient reports he has been at [facility] since 09/08/15

## 2015-08-12 NOTE — ED Triage Notes (Signed)
Pt having generalized chest pain that started this am with some sob.  No acute respiratory distress noted.  Some back pain, NO nausea or vomiting.  Denies cold symptoms but has had some lower extremity swelling for some time.

## 2015-08-12 NOTE — ED Provider Notes (Addendum)
[facility] Provider Note   CSN: [id number] Arrival date & time: 08/12/15  4098  First Provider Contact:  First MD Initiated Contact with Patient 08/12/15 [phone]        History   Chief Complaint Chief Complaint  Patient presents with  . Chest Pain    HPI Rodney Hartman is a 55 y.o. male.  Patient is a 55 year old male with a history of hypertension, alcohol abuse who is currently in a rehabilitation facility and has not had any alcohol in over 12 days, depression presenting today with chest pain and shortness of breath that woke him up from sleep approximately 1 hour ago. He states his chest feels tight with a lot of pressure and it's worse when he attempts to take a deep breath. He denies any fever, cough but has stated he has noticed his lower leg swelling over the last few days which has never happened before. He is taking his lisinopril daily and as far as he knows his blood pressure has been okay. He denies any diabetes or high cholesterol. He denies any drug use.  No prior history of MI or CHF. He does not have a history of asthma or COPD and does not use an inhaler regularly but he does smoke daily.   The history is provided by the patient.  Chest Pain   This is a new problem. The current episode started 1 to 2 hours ago. The problem occurs constantly. The problem has not changed since onset.Associated with: woke up this morning feeling chest paina nd SOB.   The pain is present in the substernal region. The pain is at a severity of 6/10. The pain is moderate. The quality of the pain is described as pressure-like. The pain does not radiate. Duration of episode(s) is 1 hour. The symptoms are aggravated by exertion and deep breathing (lying down). Associated symptoms include abdominal pain, lower extremity edema and shortness of breath. Pertinent negatives include no cough, no diaphoresis, no fever, no headaches, no nausea, no sputum production, no vomiting and no weakness.  Associated symptoms comments: Noticed leg swelling a few days ago which he has never had before. He has tried nothing for the symptoms. The treatment provided no relief. Risk factors include alcohol intake, male gender and smoking/tobacco exposure.  His past medical history is significant for hypertension.  Pertinent negatives for past medical history include no CAD, no diabetes and no strokes.  Procedure history is negative for cardiac catheterization, echocardiogram and exercise treadmill test.    Past Medical History:  Diagnosis Date  . Hypertension     Patient Active Problem List   Diagnosis Date Noted  . Alcohol abuse with intoxication, uncomplicated (HCC) 07/31/2015  . MDD (major depressive disorder), recurrent severe, without psychosis (HCC) 07/31/2015  . Suicidal ideation 07/31/2015  . Homicidal ideation 07/31/2015  . Alcohol-induced mood disorder (HCC) 01/03/2015  . Alcohol use disorder, severe, dependence (HCC) 12/17/2014  . Alcohol abuse 08/07/2014    Past Surgical History:  Procedure Laterality Date  . 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   . TONSILLECTOMY         Home Medications    Prior to Admission medications   Medication Sig Start Date End Date Taking? Authorizing Provider  diclofenac sodium (VOLTAREN) 1 % GEL Apply 4 g topically 4 (four) times daily. For arthritic pain 08/08/15   Sanjuana Kava,  NP  DULoxetine (CYMBALTA) 60 MG capsule Take 1 capsule (60 mg total) by mouth daily. For depression 08/08/15   Sanjuana Kava, NP  gabapentin (NEURONTIN) 300 MG capsule Take 1 capsule (300 mg total) by mouth 2 (two) times daily. For agitation 08/08/15   Sanjuana Kava, NP  hydrOXYzine (ATARAX/VISTARIL) 25 MG tablet Take 1 tablet (25 mg total) by mouth every 6 (six) hours as needed for anxiety. 08/08/15   Sanjuana Kava, NP  lisinopril (PRINIVIL,ZESTRIL) 10 MG tablet Take 1 tablet (10 mg total)  by mouth daily. For high blood pressure 08/08/15   Sanjuana Kava, NP  nicotine (NICODERM CQ - DOSED IN MG/24 HOURS) 21 mg/24hr patch Place 1 patch (21 mg total) onto the skin daily. For smoking cessation 08/08/15   Sanjuana Kava, NP  thiamine 100 MG tablet Take 1 tablet (100 mg total) by mouth daily. For low thiamine 08/08/15   Sanjuana Kava, NP  traZODone (DESYREL) 150 MG tablet Take 1 tablet (150 mg total) by mouth at bedtime as needed for sleep. 08/08/15   Sanjuana Kava, NP    Family History No family history on file.  Social History Social History  Substance Use Topics  . Smoking status: Current Some Day Smoker    Packs/day: 0.50    Types: Cigarettes  . Smokeless tobacco: Never Used  . Alcohol use 3.6 oz/week    6 Cans of beer per week     Comment: every other day      Allergies   Review of patient's allergies indicates no known allergies.   Review of Systems Review of Systems  Constitutional: Negative for diaphoresis and fever.  Respiratory: Positive for shortness of breath. Negative for cough and sputum production.   Cardiovascular: Positive for chest pain.  Gastrointestinal: Positive for abdominal pain. Negative for nausea and vomiting.  Neurological: Negative for weakness and headaches.  All other systems reviewed and are negative.    Physical Exam Updated Vital Signs BP (!) 154/102 (BP Location: Left Arm)   Pulse 81   Temp 98.1 F (36.7 C) (Oral)   Resp 17   SpO2 95%   Physical Exam  Constitutional: He is oriented to person, place, and time. He appears well-developed and well-nourished. No distress.  HENT:  Head: Normocephalic and atraumatic.  Mouth/Throat: Oropharynx is clear and moist.  Eyes: Conjunctivae and EOM are normal. Pupils are equal, round, and reactive to light.  Neck: Normal range of motion. Neck supple.  Cardiovascular: Normal rate, regular rhythm and intact distal pulses.   No murmur heard. Pulmonary/Chest: Effort normal. No respiratory  distress. He has wheezes. He exhibits tenderness.  Abdominal: Soft. He exhibits no distension. There is tenderness in the right upper quadrant, epigastric area and left upper quadrant. There is no rebound and no guarding.  Musculoskeletal: Normal range of motion. He exhibits edema. He exhibits no tenderness.  1+ pitting edema bilaterally to the ankle  Neurological: He is alert and oriented to person, place, and time.  Skin: Skin is warm and dry. No rash noted. No erythema.  Psychiatric: He has a normal mood and affect. His behavior is normal.  Nursing note and vitals reviewed.    ED Treatments / Results  Labs (all labs ordered are listed, but only abnormal results are displayed) Labs Reviewed  CBC WITH DIFFERENTIAL/PLATELET - Abnormal; Notable for the following:       Result Value   RBC 3.79 (*)    Hemoglobin 11.6 (*)  HCT 34.6 (*)    All other components within normal limits  BRAIN NATRIURETIC PEPTIDE - Abnormal; Notable for the following:    B Natriuretic Peptide 511.4 (*)    All other components within normal limits  COMPREHENSIVE METABOLIC PANEL  LIPASE, BLOOD  TROPONIN I    EKG  EKG Interpretation  Date/Time:  Friday August 12 2015 07:23:59 EDT Ventricular Rate:  79 PR Interval:    QRS Duration: 86 QT Interval:  426 QTC Calculation: 489 R Axis:   56 Text Interpretation:  Sinus rhythm Prolonged PR interval Probable left atrial enlargement Probable anteroseptal infarct, old Baseline wander in lead(s) V4 No significant change since last tracing Confirmed by Anitra Lauth  MD, Ariyan Brisendine [phone]) on 08/12/2015 9:00:04 AM       Radiology Dg Chest 2 View  Result Date: 08/12/2015 CLINICAL DATA:  Shortness of breath and chest pain for 1 week, progressed EXAM: CHEST  2 VIEW COMPARISON:  November 26, 2014 FINDINGS: Mild scarring in the left base is stable. There is generalized interstitial pulmonary edema. There is no appreciable airspace consolidation. There is linear atelectasis in  the right middle lobe. The heart is upper normal in size with pulmonary venous hypertension. No adenopathy. No bone lesions. IMPRESSION: Evidence of a degree of congestive heart failure. Stable scarring left base. Mild linear atelectasis in the right middle lobe. Electronically Signed   By: Bretta Bang III M.D.   On: 08/12/2015 08:08    Procedures Procedures (including critical care time)  Medications Ordered in ED Medications  nitroGLYCERIN (NITROSTAT) SL tablet 0.4 mg (0.4 mg Sublingual Given 08/12/15 0746)  morphine 4 MG/ML injection 4 mg (not administered)  lisinopril (PRINIVIL,ZESTRIL) tablet 10 mg (not administered)  furosemide (LASIX) injection 20 mg (not administered)  aspirin chewable tablet 324 mg (324 mg Oral Given 08/12/15 0745)  albuterol (PROVENTIL) (2.5 MG/3ML) 0.083% nebulizer solution 5 mg (5 mg Nebulization Given 08/12/15 0738)  ipratropium (ATROVENT) nebulizer solution 0.5 mg (0.5 mg Nebulization Given 08/12/15 0738)  acetaminophen (TYLENOL) tablet 650 mg (650 mg Oral Given 08/12/15 0818)     Initial Impression / Assessment and Plan / ED Course  I have reviewed the triage vital signs and the nursing notes.  Pertinent labs & imaging results that were available during my care of the patient were reviewed by me and considered in my medical decision making (see chart for details).  Clinical Course   Patient is a 55 year old male presenting today with chest pain, shortness of breath and lower extremity edema. The leg swelling has been going on at least 2 days but may be longer. This morning woke up with sharp centralized chest pain and shortness of breath it's worse with lying down and exertion. Patient has never had anything like this before. He has no prior cardiac history other than hypertension which he takes lisinopril 4. Patient also has a significant history of alcohol abuse but is currently in a rehabilitation facility and has not had any alcohol for over 12 days.  On exam  patient has occasional wheeze and minimal rales with 1+ pitting edema in the lower extremities bilaterally. He is hypertensive here but heart rate is within normal limits. He also has some mild abdominal pain but no focal findings. He does use tobacco but denies any drug use. He denies any history of asthma or COPD and does not use an inhaler.  Low suspicion for dissection but concern for possible ACS, CHF. He has no symptoms concerning for pneumonia and with minimal  wheezing less likely to be COPD.  EKG today without significant abnormality. Chest x-ray is consistent with CHF, BNP is elevated at 511 and troponin is negative. Renal function is within normal limits. Patient was given his dose of lisinopril here as well as Lasix. Feel that he needs admission for further evaluation of new onset CHF.  Final Clinical Impressions(s) / ED Diagnoses   Final diagnoses:  Atypical chest pain  Acute congestive heart failure, unspecified congestive heart failure type [facility])    New Prescriptions New Prescriptions   No medications on file     Gwyneth Sprout, MD 08/12/15 1610    Gwyneth Sprout, MD 08/12/15 [phone]

## 2015-08-12 NOTE — ED Notes (Signed)
Patient reports chest pain 10/10 after nitro.  Reports he also has headache.  Refuses more nitro at present.  MD made aware.

## 2015-08-12 NOTE — Progress Notes (Signed)
RN called. BP down after hydralazine and chest pain resolved but now complaining of epigastric pain and HR sustained in 120's. About to get trazadone for sleep. Got Ativan 2mg  at 8pm. Due for another 2mg  Ativan at midnight per CIWA.   Rx:   Proceed with Ativan dose now. If heartrate doesn't return to normal then give toprol prn sustained heartrate > 110. For epigastric pain will try Gi cocktail.

## 2015-08-12 NOTE — H&P (Signed)
History and Physical    [name]:[id number] DOB: Aug 06, 1960 DOA: 08/12/2015   PCP: No PCP Per Patient Rodney Hartman  Patient coming from/Resides with: Homeless-currently in residential treatment at [facility]  Chief Complaint: Chest discomfort and shortness of breath  HPI: Rodney Hartman is a 55 y.o. male with medical history significant for hypertension, major depressive disorder, ongoing alcoholism and ongoing tobacco abuse who presented to [facility] ER from [facility] after awakening during the middle the night with chest tightness and pressure as well as shortness of breath with the degree of pleuritic component. He has not had any constitutional symptoms such as fevers chills or cough. He has noticed some slight increased lower extremity edema. He was recently hospitalized for 12 days at [facility] and has not had any alcoholic beverages for 24 hours prior to that admission. He has no prior cardiac or heart failure history and does not have a history of asthma or COPD by diagnosis.  ED Course:  Vital signs: 98.1-62 of 116-76-16-RA sats 99% Repeat vital signs: 98.2-160/97-84-17 Two-view chest x-ray: Mild congestive heart failure with mild linear atelectasis in the right middle lobe without any definitive focal infiltrate Lab data: Sodium 140, potassium 4.0, BUN 8, creatinine 0.81, LFTs are normal, BNP 512, troponin less than 0.03, WBC 6400 with normal differential, hemoglobin 11.6, platelets 319,000 Medications and treatments: Aspirin 324 mg by mouth 1, nitroglycerin 0.4 mg sublingual 1, albuterol may have 5 mg 1, Atrovent neb 0.5 mg 1, Tylenol 650 mg by mouth 1, morphine 4 mg IV 1, lisinopril 10 mg by mouth 1, Lasix 20 mg IV 1   Review of Systems:  In addition to the HPI above,  No Fever-chills, myalgias or other constitutional symptoms No Headache, changes with Vision or hearing, new weakness, tingling, numbness in any extremity, No problems swallowing  food or Liquids, indigestion/reflux No Cough, palpitations, orthopnea  No Abdominal pain, N/V; no melena or hematochezia, no dark tarry stools No dysuria, hematuria or flank pain No new skin rashes, lesions, masses or bruises, No new joints pains-aches No recent weight gain or loss No polyuria, polydypsia or polyphagia,   Past Medical History:  Diagnosis Date  . Depression   . Hypertension     Past Surgical History:  Procedure Laterality Date  . 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   . TONSILLECTOMY      Social History   Social History  . Marital status: Legally Separated    Spouse name: N/A  . Number of children: N/A  . Years of education: N/A   Occupational History  . Not on file.   Social History Main Topics  . Smoking status: Current Some Day Smoker    Packs/day: 0.50    Types: Cigarettes  . Smokeless tobacco: Never Used  . Alcohol use 3.6 oz/week    6 Cans of beer per week     Comment: every other day   . Drug use: No  . Sexual activity: Yes    Birth control/ protection: None   Other Topics Concern  . Not on file   Social History Narrative   ** Merged History Encounter **        Mobility: Without assistive devices Work history: Unemployed   No Known Allergies  Family history reviewed; patient reports that mother and aunt have hypertension and "heart problems" not related to heart attack  Prior to Admission medications   Medication Sig Start Date End Date Taking? Authorizing Provider  diclofenac sodium (VOLTAREN) 1 % GEL Apply 4 g topically 4 (four) times daily. For arthritic pain 08/08/15   Sanjuana Kava, NP  DULoxetine (CYMBALTA) 60 MG capsule Take 1 capsule (60 mg total) by mouth daily. For depression 08/08/15   Sanjuana Kava, NP  gabapentin (NEURONTIN) 300 MG capsule Take 1 capsule (300 mg total) by mouth 2 (two) times daily. For agitation 08/08/15   Sanjuana Kava,  NP  hydrOXYzine (ATARAX/VISTARIL) 25 MG tablet Take 1 tablet (25 mg total) by mouth every 6 (six) hours as needed for anxiety. 08/08/15   Sanjuana Kava, NP  lisinopril (PRINIVIL,ZESTRIL) 10 MG tablet Take 1 tablet (10 mg total) by mouth daily. For high blood pressure 08/08/15   Sanjuana Kava, NP  nicotine (NICODERM CQ - DOSED IN MG/24 HOURS) 21 mg/24hr patch Place 1 patch (21 mg total) onto the skin daily. For smoking cessation 08/08/15   Sanjuana Kava, NP  thiamine 100 MG tablet Take 1 tablet (100 mg total) by mouth daily. For low thiamine 08/08/15   Sanjuana Kava, NP  traZODone (DESYREL) 150 MG tablet Take 1 tablet (150 mg total) by mouth at bedtime as needed for sleep. 08/08/15   Sanjuana Kava, NP    Physical Exam: Vitals:   08/12/15 0900 08/12/15 0930 08/12/15 1000 08/12/15 1119  BP: 133/85 145/96 138/92 (!) 160/97  Pulse: 80 82 73 79  Resp: 16 19 18 17   Temp:    98.2 F (36.8 C)  TempSrc:    Oral  SpO2: 95% 93% 96% 100%  Weight:    88.5 kg (195 lb 1.7 oz)  Height:    5\' 10"  (1.778 m)      Constitutional: NAD, calm, comfortable Eyes: PERRL, lids and conjunctivae normal ENMT: Mucous membranes are moist. Posterior pharynx clear of any exudate or lesions.Normal dentition.  Neck: normal, supple, no masses, no thyromegaly Respiratory:Mostly clear to auscultation bilaterally with a few scattered bibasilar crackles, no wheezing, no crackles. Normal respiratory effort. No accessory muscle use. RA Cardiovascular: Regular rate and rhythm, no murmurs / rubs / gallops. Trace bilateral lower extremity edema. 2+ pedal pulses. No carotid bruits.  Abdomen: no tenderness, no masses palpated. No hepatosplenomegaly. Bowel sounds positive.  Musculoskeletal: no clubbing / cyanosis. No joint deformity upper and lower extremities. Good ROM, no contractures. Normal muscle tone.  Skin: no rashes, lesions, ulcers. No induration Neurologic: CN 2-12 grossly intact. Sensation intact, DTR normal. Strength 5/5 x  all 4 extremities.  Psychiatric: Normal judgment and insight. Alert and oriented x 3. Normal mood.    Labs on Admission: I have personally reviewed following labs and imaging studies  CBC:  Recent Labs Lab 08/12/15 0740  WBC 6.4  NEUTROABS 4.8  HGB 11.6*  HCT 34.6*  MCV 91.3  PLT 319   Basic Metabolic Panel:  Recent Labs Lab 08/12/15 0740  NA 140  K 4.0  CL 104  CO2 30  GLUCOSE 89  BUN 8  CREATININE 0.81  CALCIUM 9.0   GFR: Estimated Creatinine Clearance: 118.1 mL/min (by C-G formula based on SCr of 0.81 mg/dL). Liver Function Tests:  Recent Labs Lab 08/12/15 0740  AST 30  ALT 36  ALKPHOS 53  BILITOT 0.7  PROT 6.7  ALBUMIN 3.6    Recent Labs Lab 08/12/15 0740  LIPASE 23   No results for input(s): AMMONIA in the last 168 hours.  Coagulation Profile: No results for input(s): INR, PROTIME in the last 168 hours. Cardiac Enzymes:  Recent Labs Lab 08/12/15 0740  TROPONINI <0.03   BNP (last 3 results) No results for input(s): PROBNP in the last 8760 hours. HbA1C: No results for input(s): HGBA1C in the last 72 hours. CBG: No results for input(s): GLUCAP in the last 168 hours. Lipid Profile: No results for input(s): CHOL, HDL, LDLCALC, TRIG, CHOLHDL, LDLDIRECT in the last 72 hours. Thyroid Function Tests: No results for input(s): TSH, T4TOTAL, FREET4, T3FREE, THYROIDAB in the last 72 hours. Anemia Panel: No results for input(s): VITAMINB12, FOLATE, FERRITIN, TIBC, IRON, RETICCTPCT in the last 72 hours. Urine analysis:    Component Value Date/Time   COLORURINE YELLOW 12/20/2014 1827   APPEARANCEUR CLEAR 12/20/2014 1827   LABSPEC 1.009 12/20/2014 1827   PHURINE 6.0 12/20/2014 1827   GLUCOSEU NEGATIVE 12/20/2014 1827   HGBUR NEGATIVE 12/20/2014 1827   BILIRUBINUR NEGATIVE 12/20/2014 1827   KETONESUR NEGATIVE 12/20/2014 1827   PROTEINUR NEGATIVE 12/20/2014 1827   UROBILINOGEN 0.2 05/16/2009 1145   NITRITE NEGATIVE 12/20/2014 1827    LEUKOCYTESUR NEGATIVE 12/20/2014 1827   Sepsis Labs: @LABRCNTIP (procalcitonin:4,lacticidven:4) )No results found for this or any previous visit (from the past 240 hour(s)).   Radiological Exams on Admission: Dg Chest 2 View  Result Date: 08/12/2015 CLINICAL DATA:  Shortness of breath and chest pain for 1 week, progressed EXAM: CHEST  2 VIEW COMPARISON:  November 26, 2014 FINDINGS: Mild scarring in the left base is stable. There is generalized interstitial pulmonary edema. There is no appreciable airspace consolidation. There is linear atelectasis in the right middle lobe. The heart is upper normal in size with pulmonary venous hypertension. No adenopathy. No bone lesions. IMPRESSION: Evidence of a degree of congestive heart failure. Stable scarring left base. Mild linear atelectasis in the right middle lobe. Electronically Signed   By: Bretta Bang III M.D.   On: 08/12/2015 08:08    EKG: (Independently reviewed) Sinus rhythm with ventricular rate 79 bpm, QTC 489 ms, prolonged PR interval, no ischemic changes  Assessment/Plan Principal Problem:   Acute heart failure  -Patient presents with acute chest discomfort and pleuritic shortness of breath as well as reports of decreased activity tolerance with mild dyspnea on exertion for the past several months with chest x-ray evidence of heart failure as well as elevated BNP -Patient reports out of work for 2 months and has not been able to take blood pressure medicines during that time and this may be contributory -EKG nonischemic and initial troponin within normal limits but we'll cycle troponin for completeness of exam -Echocardiogram ordered -Lasix 40 mg IV twice a day 3 doses and then reevaluate need -Continue ACE inhibitor -No beta blocker since unknown subtype heart failure and patient has underlying first-degree AV block i.e. prolonged PR interval -Acute heart failure order set initiated -Daily weights and strict I/O -Given aspirin in  the ER and will continue aspirin 81 mg daily and follow up on troponin and echo to determine if possible ischemic component to presenting symptoms  Active Problems:   Alcohol abuse -Patient currently and residential treatment at 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 and states he lost his house-in addition lost insurance coverage and was no longer able to go to the doctor or obtain his blood pressure medication-he reports  at the same time he began increasing his alcohol intake more than his baseline -It appears patient will be at the residential treatment center for a long period of time therefore arrangement of discharge follow-up for clinic based PCP should be managed by that facility -Add PPI since "CP" sx's could be 2/2 reflux/gastritis    HTN (hypertension) -Uncontrolled -Consider increasing lisinopril dosage if Lasix does not decrease blood pressure in the next 24 hours    MDD (major depressive disorder), recurrent severe, without psychosis  -Maintenance medications were changed since discharge from behavioral health Hospital and medications patient is currently receiving at the residential facility have been ordered    Anemia -Check anemia panel -Patient reports intermittent issues with numbness in feet likely from ongoing alcohol abuse and suspected B12 deficiency **Iron low at 24 so will begin Iron 325 mg BID w/ Colace BID; Vit B12 343 and Folate WNL      DVT prophylaxis: Lovenox Code Status: Full Family Communication: No family or friends at bedside Disposition Plan: Anticipate discharge back to [facility] when medically stable Consults called: None  Admission status: Inpatient/telemetry     Jehiel Koepp L. ANP-BC [facility] Pager [phone]   If 7PM-7AM, please contact  night-coverage [email or web address] Password [email or web address]  08/12/2015, 12:22 PM

## 2015-08-12 NOTE — ED Notes (Addendum)
Patient ordered morphine.  Patient requested RN call [facility] (patient currently undergoing detox from alcohol) to get approval.  Patient current disposition is admit. No answer at [facility].  Patient made aware.  Patient requesting morphine be given.

## 2015-08-13 ENCOUNTER — Encounter (HOSPITAL_COMMUNITY): Payer: Self-pay | Admitting: Cardiology

## 2015-08-13 ENCOUNTER — Inpatient Hospital Stay (HOSPITAL_COMMUNITY): Payer: Self-pay

## 2015-08-13 ENCOUNTER — Other Ambulatory Visit: Payer: Self-pay

## 2015-08-13 DIAGNOSIS — F1010 Alcohol abuse, uncomplicated: Secondary | ICD-10-CM

## 2015-08-13 DIAGNOSIS — I10 Essential (primary) hypertension: Secondary | ICD-10-CM

## 2015-08-13 DIAGNOSIS — I509 Heart failure, unspecified: Secondary | ICD-10-CM

## 2015-08-13 DIAGNOSIS — F332 Major depressive disorder, recurrent severe without psychotic features: Secondary | ICD-10-CM

## 2015-08-13 LAB — BASIC METABOLIC PANEL
Anion gap: 9 (ref 5–15)
BUN: 6 mg/dL (ref 6–20)
CALCIUM: 9.4 mg/dL (ref 8.9–10.3)
CO2: 27 mmol/L (ref 22–32)
CREATININE: 0.91 mg/dL (ref 0.61–1.24)
Chloride: 100 mmol/L — ABNORMAL LOW (ref 101–111)
GFR calc Af Amer: >60 mL/min (ref >60)
GFR calc non Af Amer: >60 mL/min (ref >60)
GLUCOSE: 107 mg/dL — AB (ref 65–99)
Potassium: 3.5 mmol/L (ref 3.5–5.1)
Sodium: 136 mmol/L (ref 135–145)

## 2015-08-13 LAB — TROPONIN I
TROPONIN I: 0.03 ng/mL — AB (ref <0.03)
Troponin I: 0.11 ng/mL (ref <0.03)
Troponin I: <0.03 ng/mL (ref <0.03)
Troponin I: <0.03 ng/mL (ref <0.03)
Troponin I: <0.03 ng/mL (ref <0.03)

## 2015-08-13 LAB — ECHOCARDIOGRAM COMPLETE
HEIGHTINCHES: 70 in
WEIGHTICAEL: 3114.66 [oz_av]

## 2015-08-13 MED ORDER — FUROSEMIDE 40 MG PO TABS
40.0000 mg | ORAL_TABLET | Freq: Every day | ORAL | Status: DC
  Administered 2015-08-13 – 2015-08-14 (×2): 40 mg via ORAL
  Filled 2015-08-13 (×2): qty 1

## 2015-08-13 MED ORDER — METOPROLOL TARTRATE 5 MG/5ML IV SOLN: 5.0000 mg | Freq: Three times a day (TID) | INTRAVENOUS | Status: DC | PRN

## 2015-08-13 NOTE — Progress Notes (Signed)
MD called back and was updated on patiwent's condition, told her that he wanted his Trazadone and she also told me to go ahead and give him another 2 mg of Ativan and continue to monitor HR, if it doesn't come down below 110, may give 5 mg IV Lopressor.

## 2015-08-13 NOTE — Progress Notes (Signed)
[facility]                                                                              Patient Demographics  Rodney Hartman, is a 55 y.o. male, DOB - 1960/12/13, ZHY:[id number]  Admit date - 08/12/2015   Admitting Physician Courage Mariea Clonts, MD  Outpatient Primary MD for the patient is No PCP Per Patient  Outpatient specialists:   LOS - 1  days    Chief Complaint  Patient presents with  . Chest Pain       Brief summary   Rodney Hartman is a 55 y.o. male with medical history significant for hypertension, major depressive disorder, ongoing alcoholism and ongoing tobacco abuse who presented to [facility] ER from [facility] after awakening during the middle the night with chest tightness and pressure as well as shortness of breath with the degree of pleuritic component. He has not had any constitutional symptoms such as fevers chills or cough. He has noticed some slight increased lower extremity edema. He was recently hospitalized for 12 days at [facility] and has not had any alcoholic beverages for 24 hours prior to that admission. He has no prior cardiac or heart failure history and does not have a history of asthma or COPD by diagnosis.   Assessment & Plan    Chest pain or dyspnea likely due to Acute heart failure  - patient presented with acute chest discomfort and shortness of breath, BNP 511, troponins essentially negative except  troponin x1 0.1. Chest x-ray showed evidence of CHF. 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 He reported at the same time he began increasing his alcohol intake more than his baseline -Add PPI    HTN (hypertension) -BP currently stable, continue Lasix, lisinopril     MDD (major depressive disorder), recurrent severe, without psychosis  -Maintenance medications were changed since discharge from behavioral health [facility] and medications patient is currently receiving at the residential facility have been ordered    Anemia anemia panel showed iron 24, ferritin 100, likely anemia of chronic disease  Code Status: FC DVT Prophylaxis:  Lovenox  Family Communication: Discussed in detail with the patient, all imaging results, lab results explained to the patient  Disposition Plan:   Time Spent in minutes 25 minutes  Procedures:  None   Consultants:   None   Antimicrobials:      Medications  Scheduled Meds: . aspirin EC  81 mg Oral Daily  . docusate sodium  100 mg Oral BID  . DULoxetine  60 mg Oral Daily  . enoxaparin (LOVENOX) injection  40 mg Subcutaneous Q24H  . ferrous sulfate  325 mg Oral BID WC  . folic acid  1 mg Oral Daily  . furosemide  40 mg Intravenous BID  . gabapentin  300 mg Oral BID  . lisinopril  10 mg Oral Daily  . LORazepam  0-4 mg Oral Q6H   Followed by  . [START ON 08/14/2015] LORazepam  0-4 mg Oral Q12H  . metoprolol  5 mg Intravenous Q8H  . multivitamin with minerals  1 tablet Oral Daily  . nicotine  21 mg Transdermal Daily  . pantoprazole  40 mg Oral Daily  . sodium chloride flush  3 mL Intravenous Q12H  . thiamine  100 mg Oral Daily   Continuous Infusions:  PRN Meds:.sodium chloride, acetaminophen, gi cocktail, LORazepam **OR** LORazepam, morphine injection, ondansetron (ZOFRAN) IV, sodium chloride  flush, traZODone   Antibiotics   Anti-infectives    None        Subjective:   Rodney Hartman was seen and examined today. Feeling somewhat better today, no chest pain, shortness of breath is improving. No acute complaints. Patient denies dizziness, abdominal pain, N/V/D/C, new weakness, numbess, tingling. No acute events overnight.    Objective:   Vitals:   08/13/15 0615 08/13/15 0800 08/13/15 0900 08/13/15 1103  BP: 119/79 129/85  125/81  Pulse: 85  94 85  Resp:      Temp:      TempSrc:      SpO2: 100%  97% 95%  Weight:      Height:        Intake/Output Summary (Last 24 hours) at 08/13/15 1153 Last data filed at 08/13/15 1100  Gross per 24 hour  Intake             1080 ml  Output             3370 ml  Net            -2290 ml     Wt Readings from Last 3 Encounters:  08/13/15 88.3 kg (194 lb 10.7 oz)  07/30/15 78 kg (172 lb)  01/25/15 82.6 kg (182 lb)     Exam  General: Alert and oriented x 3, NAD  HEENT:    Neck: Supple, no JVD  Cardiovascular: S1 S2 auscultated, no rubs, murmurs or gallops. Regular rate and rhythm.  Respiratory: Clear to auscultation bilaterally, no wheezing, rales or rhonchi  Gastrointestinal: Soft, nontender, nondistended, + bowel sounds  Ext: no cyanosis clubbing or edema  Neuro: AAOx3, Cr N's II- XII. Strength 5/5 upper and lower extremities bilaterally  Skin: No rashes  Psych: Normal affect and demeanor, alert and oriented x3    Data Reviewed:  I have personally reviewed following labs and imaging studies  Micro Results No results found for this or any previous visit (from the past 240 hour(s)).  Radiology Reports Dg Chest 2 View  Result Date: 08/12/2015 CLINICAL DATA:  Shortness of breath and chest pain for 1 week, progressed EXAM: CHEST  2 VIEW COMPARISON:  November 26, 2014 FINDINGS: Mild scarring in the left base is stable. There is generalized interstitial pulmonary edema. There is no appreciable airspace  consolidation. There is linear atelectasis in the right middle lobe. The heart is upper normal in size with pulmonary venous hypertension. No adenopathy. No bone lesions. IMPRESSION: Evidence of a  degree of congestive heart failure. Stable scarring left base. Mild linear atelectasis in the right middle lobe. Electronically Signed   By: Bretta Bang III M.D.   On: 08/12/2015 08:08    Lab Data:  CBC:  Recent Labs Lab 08/12/15 0740  WBC 6.4  NEUTROABS 4.8  HGB 11.6*  HCT 34.6*  MCV 91.3  PLT 319   Basic Metabolic Panel:  Recent Labs Lab 08/12/15 0740 08/12/15 1155 08/12/15 2306  NA 140  --  136  K 4.0  --  3.5  CL 104  --  100*  CO2 30  --  27  GLUCOSE 89  --  107*  BUN 8  --  6  CREATININE 0.81  --  0.91  CALCIUM 9.0  --  9.4  MG  --  1.8  --    GFR: Estimated Creatinine Clearance: 105 mL/min (by C-G formula based on SCr of 0.91 mg/dL). Liver Function Tests:  Recent Labs Lab 08/12/15 0740  AST 30  ALT 36  ALKPHOS 53  BILITOT 0.7  PROT 6.7  ALBUMIN 3.6    Recent Labs Lab 08/12/15 0740  LIPASE 23   No results for input(s): AMMONIA in the last 168 hours. Coagulation Profile: No results for input(s): INR, PROTIME in the last 168 hours. Cardiac Enzymes:  Recent Labs Lab 08/12/15 1155 08/12/15 1820 08/12/15 2306 08/13/15 0337 08/13/15 0953  TROPONINI <0.03 <0.03 0.11* <0.03 <0.03   BNP (last 3 results) No results for input(s): PROBNP in the last 8760 hours. HbA1C: No results for input(s): HGBA1C in the last 72 hours. CBG: No results for input(s): GLUCAP in the last 168 hours. Lipid Profile: No results for input(s): CHOL, HDL, LDLCALC, TRIG, CHOLHDL, LDLDIRECT in the last 72 hours. Thyroid Function Tests:  Recent Labs  08/12/15 1155  TSH 1.791   Anemia Panel:  Recent Labs  08/12/15 1155  VITAMINB12 343  FOLATE 20.3  FERRITIN 100  TIBC 300  IRON 24*  RETICCTPCT 1.9   Urine analysis:    Component Value Date/Time   COLORURINE  YELLOW 12/20/2014 1827   APPEARANCEUR CLEAR 12/20/2014 1827   LABSPEC 1.009 12/20/2014 1827   PHURINE 6.0 12/20/2014 1827   GLUCOSEU NEGATIVE 12/20/2014 1827   HGBUR NEGATIVE 12/20/2014 1827   BILIRUBINUR NEGATIVE 12/20/2014 1827   KETONESUR NEGATIVE 12/20/2014 1827   PROTEINUR NEGATIVE 12/20/2014 1827   UROBILINOGEN 0.2 05/16/2009 1145   NITRITE NEGATIVE 12/20/2014 1827   LEUKOCYTESUR NEGATIVE 12/20/2014 1827     RAI,RIPUDEEP M.D. [facility] 08/13/2015, 11:53 AM  Pager: [phone] Between 7am to 7pm - call Pager - [phone]  After 7pm go to [email or web address] - password [email or web address]  Call night coverage person covering after 7pm

## 2015-08-13 NOTE — Progress Notes (Signed)
Patient's HR 110-120's at this time and he says that his pain is gone but still remains a little diaphoretic and trembly. Patient was also placed on 2 Liter of O2 via [place] when he first starting having chest pain which seems to be helping. Patient is requesting his Trazadone, will give but also placed a text page to notify MD of increasing HR and now patient is c/o gas pains, awaiting call back or further orders.

## 2015-08-13 NOTE — Progress Notes (Signed)
Patient c/o chest pain 10/10 across chest and radiating to back sharp, achy and stabbing at times with diaphoresis and nausea, EKG done, Vs taken and patient given 1st NTG S/L, text paged MD, awaiting to hear from her, will give some Tylenol and some Ativan at this time and continue to monitor.

## 2015-08-13 NOTE — Progress Notes (Signed)
HR still in the high 110's but down from 120-130's and patient is much calmer at this time, will give his 5 mg IV Lopressor slowly and continue to monitor. Lab called while I was in giving his Lopressor and gave me a troponin level of 0.11 which was elevated from less than 0.03 x 2 previously, text paged MD again, awaiting further orders. Patient denies chest pain at this time and is getting sleepy, VSS at this time, will continue to monitor.

## 2015-08-13 NOTE — Plan of Care (Signed)
Problem: Pain Managment: Goal: General experience of comfort will improve Outcome: Progressing Patient started out with chest pain rated 10/10 and he was able to express quality, radiation, severity and what helps to ease the pain, will call MD as needed to resolve current chest pain and continue to monitor.

## 2015-08-13 NOTE — Plan of Care (Signed)
Problem: Safety: Goal: Ability to remain free from injury will improve Outcome: Progressing Patient was shown how to use his phone and where the numbers were on the white board to call RN/NT for assistance and has his personal possessions at his bedside within reach, will continue to monitor.

## 2015-08-13 NOTE — Consult Note (Signed)
Primary cardiologist: New  HPI: 55 year old male for evaluation of acute diastolic congestive heart failure and chest pain. No prior cardiac history. Patient states that for the 2 days preceding admission he noticed dyspnea on exertion and pedal edema. No orthopnea or PND. He also had pain in the left chest radiating to his left upper quadrant. The pain was sharp and increased with inspiration. No fevers, chills or hemoptysis. Cough productive of "phlegm".Admitted and cardiology asked to evaluate. Initial blood pressure 154/102.  Medications Prior to Admission  Medication Sig Dispense Refill  . ibuprofen (ADVIL,MOTRIN) 200 MG tablet Take 400 mg by mouth every 6 (six) hours as needed for moderate pain.    . DULoxetine (CYMBALTA) 60 MG capsule Take 1 capsule (60 mg total) by mouth daily. For depression (Patient not taking: Reported on 08/12/2015) 30 capsule 0  . gabapentin (NEURONTIN) 300 MG capsule Take 1 capsule (300 mg total) by mouth 2 (two) times daily. For agitation (Patient not taking: Reported on 08/12/2015) 60 capsule 0  . hydrOXYzine (ATARAX/VISTARIL) 25 MG tablet Take 1 tablet (25 mg total) by mouth every 6 (six) hours as needed for anxiety. (Patient not taking: Reported on 08/12/2015) 60 tablet 0  . lisinopril (PRINIVIL,ZESTRIL) 10 MG tablet Take 1 tablet (10 mg total) by mouth daily. For high blood pressure (Patient not taking: Reported on 08/12/2015) 30 tablet 0  . thiamine 100 MG tablet Take 1 tablet (100 mg total) by mouth daily. For low thiamine (Patient not taking: Reported on 08/12/2015) 30 tablet 0  . traZODone (DESYREL) 150 MG tablet Take 1 tablet (150 mg total) by mouth at bedtime as needed for sleep. (Patient not taking: Reported on 08/12/2015) 30 tablet 0    No Known Allergies   Past Medical History:  Diagnosis Date  . Alcohol abuse   . Depression   . Hypertension   . PUD (peptic ulcer disease)     Past Surgical History:  Procedure Laterality Date  . 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   . TONSILLECTOMY      Social History   Social History  . Marital status: Legally Separated    Spouse name: N/A  . Number of children: N/A  . Years of education: N/A   Occupational History  . Not on file.   Social History Main Topics  . Smoking status: Current Some Day Smoker    Packs/day: 0.50    Types: Cigarettes  . Smokeless tobacco: Never Used  . Alcohol use 3.6 oz/week    6 Cans of beer per week     Comment: every other day   . Drug use: No  . Sexual activity: Yes    Birth control/ protection: None   Other Topics Concern  . Not on file   Social History Narrative   ** Merged History Encounter **        Family History  Problem Relation Age of Onset  . Heart disease Mother     ROS:  Cough productive of "phlegm but no fevers or chills, productive cough, hemoptysis, dysphasia, odynophagia, melena, hematochezia, dysuria, hematuria, rash, seizure activity, orthopnea, PND, claudication. Remaining systems are negative.  Physical Exam:   Blood pressure 125/81, pulse 85, temperature 99.1 F (37.3 C), temperature source Oral, resp. rate 18, height 5' 10" (1.778 m), weight 194 lb 10.7 oz (88.3 kg), SpO2 95 %.  General:  Well developed/well nourished  in NAD Skin warm/dry Patient not depressed No peripheral clubbing Back-normal HEENT-normal/normal eyelids Neck supple/normal carotid upstroke bilaterally; no bruits; no JVD; no thyromegaly chest - CTA/ normal expansion CV - RRR/normal S1 and S2; no murmurs, rubs or gallops;  PMI nondisplaced Abdomen -NT/ND, no HSM, no mass, + bowel sounds, no bruit 2+ femoral pulses, no bruits Ext-no edema, chords, 2+ DP Neuro-grossly nonfocal  ECG - sinus rhythm, first-degree AV block, cannot rule out prior septal infarct, nonspecific ST changes.  Results for orders placed or performed during the hospital encounter of 08/12/15 (from the  past 48 hour(s))  CBC with Differential/Platelet     Status: Abnormal   Collection Time: 08/12/15  7:40 AM  Result Value Ref Range   WBC 6.4 4.0 - 10.5 K/uL   RBC 3.79 (L) 4.22 - 5.81 MIL/uL   Hemoglobin 11.6 (L) 13.0 - 17.0 g/dL   HCT 34.6 (L) 39.0 - 52.0 %   MCV 91.3 78.0 - 100.0 fL   MCH 30.6 26.0 - 34.0 pg   MCHC 33.5 30.0 - 36.0 g/dL   RDW 14.2 11.5 - 15.5 %   Platelets 319 150 - 400 K/uL   Neutrophils Relative % 74 %   Neutro Abs 4.8 1.7 - 7.7 K/uL   Lymphocytes Relative 12 %   Lymphs Abs 0.8 0.7 - 4.0 K/uL   Monocytes Relative 9 %   Monocytes Absolute 0.6 0.1 - 1.0 K/uL   Eosinophils Relative 4 %   Eosinophils Absolute 0.2 0.0 - 0.7 K/uL   Basophils Relative 1 %   Basophils Absolute 0.0 0.0 - 0.1 K/uL  Comprehensive metabolic panel     Status: None   Collection Time: 08/12/15  7:40 AM  Result Value Ref Range   Sodium 140 135 - 145 mmol/L   Potassium 4.0 3.5 - 5.1 mmol/L   Chloride 104 101 - 111 mmol/L   CO2 30 22 - 32 mmol/L   Glucose, Bld 89 65 - 99 mg/dL   BUN 8 6 - 20 mg/dL   Creatinine, Ser 0.81 0.61 - 1.24 mg/dL   Calcium 9.0 8.9 - 10.3 mg/dL   Total Protein 6.7 6.5 - 8.1 g/dL   Albumin 3.6 3.5 - 5.0 g/dL   AST 30 15 - 41 U/L   ALT 36 17 - 63 U/L   Alkaline Phosphatase 53 38 - 126 U/L   Total Bilirubin 0.7 0.3 - 1.2 mg/dL   GFR calc non Af Amer >60 >60 mL/min   GFR calc Af Amer >60 >60 mL/min    Comment: (NOTE) The eGFR has been calculated using the CKD EPI equation. This calculation has not been validated in all clinical situations. eGFR's persistently <60 mL/min signify possible Chronic Kidney Disease.    Anion gap 6 5 - 15  Lipase, blood     Status: None   Collection Time: 08/12/15  7:40 AM  Result Value Ref Range   Lipase 23 11 - 51 U/L  Brain natriuretic peptide     Status: Abnormal   Collection Time: 08/12/15  7:40 AM  Result Value Ref Range   B Natriuretic Peptide 511.4 (H) 0.0 - 100.0 pg/mL  Troponin I     Status: None   Collection Time:  08/12/15  7:40 AM  Result Value Ref Range   Troponin I <0.03 <0.03 ng/mL  TSH     Status: None   Collection Time: 08/12/15 11:55 AM  Result Value Ref Range   TSH 1.791 0.350 - 4.500 uIU/mL  Magnesium     Status: None   Collection Time: 08/12/15 11:55 AM  Result Value Ref Range   Magnesium 1.8 1.7 - 2.4 mg/dL  Troponin I     Status: None   Collection Time: 08/12/15 11:55 AM  Result Value Ref Range   Troponin I <0.03 <0.03 ng/mL  Vitamin B12     Status: None   Collection Time: 08/12/15 11:55 AM  Result Value Ref Range   Vitamin B-12 343 180 - 914 pg/mL    Comment: (NOTE) This assay is not validated for testing neonatal or myeloproliferative syndrome specimens for Vitamin B12 levels.   Folate     Status: None   Collection Time: 08/12/15 11:55 AM  Result Value Ref Range   Folate 20.3 >5.9 ng/mL  Iron and TIBC     Status: Abnormal   Collection Time: 08/12/15 11:55 AM  Result Value Ref Range   Iron 24 (L) 45 - 182 ug/dL   TIBC 300 250 - 450 ug/dL   Saturation Ratios 8 (L) 17.9 - 39.5 %   UIBC 276 ug/dL  Ferritin     Status: None   Collection Time: 08/12/15 11:55 AM  Result Value Ref Range   Ferritin 100 24 - 336 ng/mL  Reticulocytes     Status: Abnormal   Collection Time: 08/12/15 11:55 AM  Result Value Ref Range   Retic Ct Pct 1.9 0.4 - 3.1 %   RBC. 3.82 (L) 4.22 - 5.81 MIL/uL   Retic Count, Manual 72.6 19.0 - 186.0 K/uL  Troponin I     Status: None   Collection Time: 08/12/15  6:20 PM  Result Value Ref Range   Troponin I <0.03 <0.03 ng/mL  Troponin I     Status: Abnormal   Collection Time: 08/12/15 11:06 PM  Result Value Ref Range   Troponin I 0.11 (HH) <0.03 ng/mL    Comment: CRITICAL RESULT CALLED TO, READ BACK BY AND VERIFIED WITH: RODGRIS D,RN 08/13/15 0022 WAYK   Basic metabolic panel     Status: Abnormal   Collection Time: 08/12/15 11:06 PM  Result Value Ref Range   Sodium 136 135 - 145 mmol/L   Potassium 3.5 3.5 - 5.1 mmol/L   Chloride 100 (L) 101 - 111  mmol/L   CO2 27 22 - 32 mmol/L   Glucose, Bld 107 (H) 65 - 99 mg/dL   BUN 6 6 - 20 mg/dL   Creatinine, Ser 0.91 0.61 - 1.24 mg/dL   Calcium 9.4 8.9 - 10.3 mg/dL   GFR calc non Af Amer >60 >60 mL/min   GFR calc Af Amer >60 >60 mL/min    Comment: (NOTE) The eGFR has been calculated using the CKD EPI equation. This calculation has not been validated in all clinical situations. eGFR's persistently <60 mL/min signify possible Chronic Kidney Disease.    Anion gap 9 5 - 15  Troponin I     Status: None   Collection Time: 08/13/15  3:37 AM  Result Value Ref Range   Troponin I <0.03 <0.03 ng/mL  Troponin I (q 6hr x 3)     Status: None   Collection Time: 08/13/15  9:53 AM  Result Value Ref Range   Troponin I <0.03 <0.03 ng/mL    Dg Chest 2 View  Result Date: 08/12/2015 CLINICAL DATA:  Shortness of breath and chest pain for 1 week, progressed EXAM: CHEST  2 VIEW COMPARISON:  November 26, 2014 FINDINGS: Mild scarring in the left base is stable.  There is generalized interstitial pulmonary edema. There is no appreciable airspace consolidation. There is linear atelectasis in the right middle lobe. The heart is upper normal in size with pulmonary venous hypertension. No adenopathy. No bone lesions. IMPRESSION: Evidence of a degree of congestive heart failure. Stable scarring left base. Mild linear atelectasis in the right middle lobe. Electronically Signed   By: Lowella Grip III M.D.   On: 08/12/2015 08:08    Assessment/Plan 1 acute diastolic congestive heart failure-patient presented with symptoms of CHF. He has improved this morning. Change Lasix to 40 mg daily. Await echocardiogram. 2 chest pain-symptoms are extremely atypical. Electrocardiogram shows no diagnostic ST changes. Enzymes are negative for acute coronary syndrome. If LV function normal would not pursue further ischemia evaluation. 3 hypertension-pressure elevated at time of admission. Agree with lisinopril and increase as  needed. 4 alcohol abuse-patient counseled on discontinuing. 5 tobacco abuse-patient counseled on discontinuing.  Kirk Ruths MD 08/13/2015, 1:52 PM

## 2015-08-13 NOTE — Progress Notes (Signed)
RN paged- first two trop neg but 3rd 0.11. HR now in 100s since dose of lopressor and BP better after Hydralazine earlier.  Recc: EKG now and will repeat another trop in 3 hours.

## 2015-08-13 NOTE — Progress Notes (Signed)
Report received via Geneticist, molecular format, reviewed orders, labs, VS, meds, tests and patient's general condition, assumed care of patient.

## 2015-08-13 NOTE — Progress Notes (Signed)
Patient is a little more comfortable at this time but is now c/o nausea with burning in his stomach rated 5/10, will give Zofran 4 mg IVP ann continue to monitor. MD had also ordered Hydralazine 10 mg IVP and it was given to attempt to bring his B/P down, will continue to monitor but it is coming down slowly at this time.

## 2015-08-13 NOTE — Progress Notes (Signed)
MD called back and was update on VS, results of EKG, meds given and patient's condition at this time, B/P 160-170/100's and he has no other meds for pain, she ordered Morphine 2 mg IV Q 4 hrs, will give and continue to monitor.

## 2015-08-13 NOTE — Plan of Care (Signed)
Problem: Pain Managment: Goal: General experience of comfort will improve Outcome: Progressing Patient progressing, Troponin negative x2, switched to PO Lasix, reporting no pain at this time.  CIWA at 2 at this time. VSS, SAT 97 % on room air. ECHO results EF 55-60 % grade 1 diastolic dysfunction with increased LV pressures.  Pt. Wearing SCDs. CHF booklet given to pt, smoking cessation education provided

## 2015-08-14 DIAGNOSIS — R0789 Other chest pain: Secondary | ICD-10-CM

## 2015-08-14 DIAGNOSIS — I5031 Acute diastolic (congestive) heart failure: Secondary | ICD-10-CM

## 2015-08-14 LAB — BASIC METABOLIC PANEL
Anion gap: 8 (ref 5–15)
BUN: 13 mg/dL (ref 6–20)
CHLORIDE: 100 mmol/L — AB (ref 101–111)
CO2: 29 mmol/L (ref 22–32)
CREATININE: 0.95 mg/dL (ref 0.61–1.24)
Calcium: 9.5 mg/dL (ref 8.9–10.3)
GFR calc non Af Amer: >60 mL/min (ref >60)
GLUCOSE: 113 mg/dL — AB (ref 65–99)
Potassium: 3.8 mmol/L (ref 3.5–5.1)
Sodium: 137 mmol/L (ref 135–145)

## 2015-08-14 MED ORDER — LISINOPRIL 20 MG PO TABS
20.0000 mg | ORAL_TABLET | Freq: Every day | ORAL | Status: DC
  Administered 2015-08-15: 20 mg via ORAL
  Filled 2015-08-14: qty 1

## 2015-08-14 MED ORDER — FUROSEMIDE 20 MG PO TABS
20.0000 mg | ORAL_TABLET | Freq: Every day | ORAL | Status: DC
  Administered 2015-08-15: 20 mg via ORAL
  Filled 2015-08-14: qty 1

## 2015-08-14 NOTE — Progress Notes (Signed)
    Subjective:  Denies CP or dyspnea   Objective:  Vitals:   08/13/15 1752 08/13/15 2157 08/14/15 0438 08/14/15 0546  BP: 136/82 (!) 154/104  136/87  Pulse:  78  67  Resp:  18  18  Temp:  98.7 F (37.1 C)  98 F (36.7 C)  TempSrc:  Oral  Oral  SpO2:  96%  100%  Weight:   187 lb 9.6 oz (85.1 kg)   Height:        Intake/Output from previous day:  Intake/Output Summary (Last 24 hours) at 08/14/15 0953 Last data filed at 08/14/15 0900  Gross per 24 hour  Intake              720 ml  Output             2395 ml  Net            -1675 ml    Physical Exam: Physical exam: Well-developed well-nourished in no acute distress.  Skin is warm and dry.  HEENT is normal.  Neck is supple.  Chest is clear to auscultation with normal expansion.  Cardiovascular exam is regular rate and rhythm.  Abdominal exam nontender or distended. No masses palpated. Extremities show no edema. neuro grossly intact    Lab Results: Basic Metabolic Panel:  Recent Labs  40/98/1106/04/25 1155 08/12/15 2306 08/14/15 0526  NA  --  136 137  K  --  3.5 3.8  CL  --  100* 100*  CO2  --  27 29  GLUCOSE  --  107* 113*  BUN  --  6 13  CREATININE  --  0.91 0.95  CALCIUM  --  9.4 9.5  MG 1.8  --   --    CBC:  Recent Labs  08/12/15 0740  WBC 6.4  NEUTROABS 4.8  HGB 11.6*  HCT 34.6*  MCV 91.3  PLT 319   Cardiac Enzymes:  Recent Labs  08/13/15 0953 08/13/15 1352 08/13/15 1756  TROPONINI <0.03 0.03* <0.03     Assessment/Plan:  1 acute diastolic congestive heart failure-patient presented with symptoms of CHF. He has improved this morning. Change Lasix to 20 mg daily. Echo shows normal LV function with grade 1 diastolic dysfunction.  2 chest pain-symptoms extremely atypical. Electrocardiogram shows no diagnostic ST changes. Enzymes are negative for acute coronary syndrome. LV function normal. No further ischemia eval. 3 hypertension-pressure elevated. Increase lisinopril to 20 mg daily. 4  alcohol abuse-patient counseled on discontinuing. 5 tobacco abuse-patient counseled on discontinuing. We will sign off; please call with questions. Olga MillersBrian Crenshaw 08/14/2015, 9:53 AM

## 2015-08-14 NOTE — Progress Notes (Signed)
[facility]                                                                              Patient Demographics  Rodney Hartman, is a 55 y.o. male, DOB - [birth year], XLK:[id number]  Admit date - 08/12/2015   Admitting Physician Courage Mariea Clonts, MD  Outpatient Primary MD for the patient is No PCP Per Patient  Outpatient specialists:   LOS - 2  days    Chief Complaint  Patient presents with  . Chest Pain       Brief summary   Rodney Hartman is a 55 y.o. male with medical history significant for hypertension, major depressive disorder, ongoing alcoholism and ongoing tobacco abuse who presented to [facility] ER from [facility] after awakening during the middle the night with chest tightness and pressure as well as shortness of breath with the degree of pleuritic component. He has not had any constitutional symptoms such as fevers chills or cough. He has noticed some slight increased lower extremity edema. He was recently hospitalized for 12 days at [facility] and has not had any alcoholic beverages for 24 hours prior to that admission. He has no prior cardiac or heart failure history and does not have a history of asthma or COPD by diagnosis.   Assessment & Plan    Chest pain or dyspnea likely due to Acute Diastolic CHF heart failure  - patient presented with acute chest discomfort and shortness of breath, BNP 511, troponins essentially negative except  troponin x1 0.1. Chest x-ray showed evidence of CHF. Patient reports out of work for 2 months and has not been able to take antihypertensives during that time and this may be contributory - Transitioned to oral Lasix, negative balance of 3 L. 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 He reported at the same time he began increasing his alcohol intake more than his baseline -Add PPI    HTN (hypertension) -BP currently stable, continue Lasix, lisinopril     MDD (major depressive disorder), recurrent severe, without psychosis  -Maintenance medications were changed since discharge from behavioral health Hospital and medications patient is currently receiving at the residential facility have been ordered    Anemia anemia panel showed iron 24, ferritin 100, likely anemia of chronic disease  Code Status: FC DVT Prophylaxis:  Lovenox  Family Communication: Discussed in detail with the patient, all imaging results, lab results explained to the patient  [facility] Plan[facility] in [facility]  Time Spent in minutes 25 minutes  Procedures:  None   Consultants:   Cardiology  Antimicrobials:  Medications  Scheduled Meds: . aspirin EC  81 mg Oral Daily  . docusate sodium  100 mg Oral BID  . DULoxetine  60 mg Oral Daily  . enoxaparin (LOVENOX) injection  40 mg Subcutaneous Q24H  . ferrous sulfate  325 mg Oral BID WC  . folic acid  1 mg Oral Daily  . [START ON 08/15/2015] furosemide  20 mg Oral Daily  . gabapentin  300 mg Oral BID  . [START ON 08/15/2015] lisinopril  20 mg Oral Daily  . LORazepam  0-4 mg Oral Q12H  . multivitamin with minerals  1 tablet Oral Daily  . nicotine  21 mg Transdermal Daily  . pantoprazole  40 mg Oral Daily  . sodium chloride flush  3 mL Intravenous Q12H  . thiamine  100 mg Oral Daily   Continuous Infusions:  PRN Meds:.sodium chloride, acetaminophen, gi cocktail, LORazepam **OR** LORazepam, metoprolol, morphine injection, ondansetron (ZOFRAN) IV, sodium  chloride flush, traZODone   Antibiotics   Anti-infectives    None        Subjective:   Rodney Hartman was seen and examined today. Feeling better today, no chest painOr shortness of breath. Somewhat sleepy due to the Ativan. No acute complaints. Patient denies dizziness, abdominal pain, N/V/D/C, new weakness, numbess, tingling. No acute events overnight.    Objective:   Vitals:   08/13/15 2157 08/14/15 0438 08/14/15 0546 08/14/15 1100  BP: (!) 154/104  136/87 127/85  Pulse: 78  67   Resp: 18  18   Temp: 98.7 F (37.1 C)  98 F (36.7 C)   TempSrc: Oral  Oral   SpO2: 96%  100%   Weight:  85.1 kg (187 lb 9.6 oz)    Height:        Intake/Output Summary (Last 24 hours) at 08/14/15 1244 Last data filed at 08/14/15 0900  Gross per 24 hour  Intake              720 ml  Output             1375 ml  Net             -655 ml     Wt Readings from Last 3 Encounters:  08/14/15 85.1 kg (187 lb 9.6 oz)  07/30/15 78 kg (172 lb)  01/25/15 82.6 kg (182 lb)     Exam  General: Sleepy but easily arousable and oriented  HEENT:    Neck: Supple, no JVD  Cardiovascular: S1 S2 clear, RRR  Respiratory: Clear to auscultation bilaterally, no wheezing, rales or rhonchi  Gastrointestinal: Soft, nontender, nondistended, + bowel sounds  Ext: no cyanosis clubbing or edema  Neuro: no deficits  Skin: No rashes  Psych:    Data Reviewed:  I have personally reviewed following labs and imaging studies  Micro Results No results found for this or any previous visit (from the past 240 hour(s)).  Radiology Reports Dg Chest 2 View  Result Date: 08/12/2015 CLINICAL DATA:  Shortness of breath and chest pain for 1 week, progressed EXAM: CHEST  2 VIEW COMPARISON:  November 26, 2014 FINDINGS: Mild scarring in the left base is stable. There is generalized interstitial pulmonary edema. There is no appreciable airspace consolidation. There is linear atelectasis in the right middle lobe. The heart  is upper normal in size with pulmonary venous hypertension. No adenopathy. No bone lesions. IMPRESSION: Evidence of a degree of congestive heart failure. Stable scarring left base. Mild linear atelectasis in the right middle lobe.  Electronically Signed   By: Bretta Bang III M.D.   On: 08/12/2015 08:08    Lab Data:  CBC:  Recent Labs Lab 08/12/15 0740  WBC 6.4  NEUTROABS 4.8  HGB 11.6*  HCT 34.6*  MCV 91.3  PLT 319   Basic Metabolic Panel:  Recent Labs Lab 08/12/15 0740 08/12/15 1155 08/12/15 2306 08/14/15 0526  NA 140  --  136 137  K 4.0  --  3.5 3.8  CL 104  --  100* 100*  CO2 30  --  27 29  GLUCOSE 89  --  107* 113*  BUN 8  --  6 13  CREATININE 0.81  --  0.91 0.95  CALCIUM 9.0  --  9.4 9.5  MG  --  1.8  --   --    GFR: Estimated Creatinine Clearance: 92.9 mL/min (by C-G formula based on SCr of 0.95 mg/dL). Liver Function Tests:  Recent Labs Lab 08/12/15 0740  AST 30  ALT 36  ALKPHOS 53  BILITOT 0.7  PROT 6.7  ALBUMIN 3.6    Recent Labs Lab 08/12/15 0740  LIPASE 23   No results for input(s): AMMONIA in the last 168 hours. Coagulation Profile: No results for input(s): INR, PROTIME in the last 168 hours. Cardiac Enzymes:  Recent Labs Lab 08/12/15 2306 08/13/15 0337 08/13/15 0953 08/13/15 1352 08/13/15 1756  TROPONINI 0.11* <0.03 <0.03 0.03* <0.03   BNP (last 3 results) No results for input(s): PROBNP in the last 8760 hours. HbA1C: No results for input(s): HGBA1C in the last 72 hours. CBG: No results for input(s): GLUCAP in the last 168 hours. Lipid Profile: No results for input(s): CHOL, HDL, LDLCALC, TRIG, CHOLHDL, LDLDIRECT in the last 72 hours. Thyroid Function Tests:  Recent Labs  08/12/15 1155  TSH 1.791   Anemia Panel:  Recent Labs  08/12/15 1155  VITAMINB12 343  FOLATE 20.3  FERRITIN 100  TIBC 300  IRON 24*  RETICCTPCT 1.9   Urine analysis:    Component Value Date/Time   COLORURINE YELLOW 12/20/2014 1827    APPEARANCEUR CLEAR 12/20/2014 1827   LABSPEC 1.009 12/20/2014 1827   PHURINE 6.0 12/20/2014 1827   GLUCOSEU NEGATIVE 12/20/2014 1827   HGBUR NEGATIVE 12/20/2014 1827   BILIRUBINUR NEGATIVE 12/20/2014 1827   KETONESUR NEGATIVE 12/20/2014 1827   PROTEINUR NEGATIVE 12/20/2014 1827   UROBILINOGEN 0.2 05/16/2009 1145   NITRITE NEGATIVE 12/20/2014 1827   LEUKOCYTESUR NEGATIVE 12/20/2014 1827     Yanelle Sousa M.D. [facility] 08/14/2015, 12:44 PM  Pager: [phone] Between 7am to 7pm - call Pager - [phone]  After 7pm go to [email or web address] - password [email or web address]  Call night coverage person covering after 7pm

## 2015-08-15 LAB — BASIC METABOLIC PANEL
Anion gap: 9 (ref 5–15)
BUN: 10 mg/dL (ref 6–20)
CHLORIDE: 98 mmol/L — AB (ref 101–111)
CO2: 29 mmol/L (ref 22–32)
CREATININE: 0.86 mg/dL (ref 0.61–1.24)
Calcium: 9.9 mg/dL (ref 8.9–10.3)
GFR calc Af Amer: >60 mL/min (ref >60)
Glucose, Bld: 95 mg/dL (ref 65–99)
Potassium: 3.7 mmol/L (ref 3.5–5.1)
SODIUM: 136 mmol/L (ref 135–145)

## 2015-08-15 MED ORDER — ASPIRIN 81 MG PO TBEC: 81.0000 mg | DELAYED_RELEASE_TABLET | Freq: Every day | ORAL | 3 refills | Status: DC

## 2015-08-15 MED ORDER — LISINOPRIL 20 MG PO TABS: 20.0000 mg | ORAL_TABLET | Freq: Every day | ORAL | 4 refills | Status: DC

## 2015-08-15 MED ORDER — FOLIC ACID 1 MG PO TABS: 1.0000 mg | ORAL_TABLET | Freq: Every day | ORAL | 1 refills | Status: DC

## 2015-08-15 MED ORDER — FUROSEMIDE 20 MG PO TABS: 20.0000 mg | ORAL_TABLET | Freq: Every day | ORAL | 3 refills | Status: DC

## 2015-08-15 MED ORDER — PANTOPRAZOLE SODIUM 40 MG PO TBEC: 40.0000 mg | DELAYED_RELEASE_TABLET | Freq: Every day | ORAL | 1 refills | Status: DC

## 2015-08-15 MED ORDER — FERROUS SULFATE 325 (65 FE) MG PO TABS: 325.0000 mg | ORAL_TABLET | Freq: Every day | ORAL | 3 refills | Status: DC

## 2015-08-15 MED ORDER — THIAMINE HCL 100 MG PO TABS: 100.0000 mg | ORAL_TABLET | Freq: Every day | ORAL | 0 refills | Status: DC

## 2015-08-15 NOTE — Clinical Social Work Note (Signed)
[facility].  Roddie McBryant Laurrie Toppin MSW, [place]CSW, [place], [phone]

## 2015-08-15 NOTE — Clinical Social Work Note (Signed)
[facility]. The patient states that he needs a ride to the facility. The patient has no family or friends to assist. The patient is homeless and has no financial resources to pay for a taxi. CSW provided RN with a taxi voucher to assist with [facility] the [facility] [place]. CS[facility] still unable to [facility] due to the phones being down.   Roddie McBryant Lema Heinkel MSW, [place]CSW, [place], [phone]

## 2015-08-15 NOTE — Discharge Summary (Signed)
Physician Discharge Summary   Patient ID: Rodney Hartman MRN: [id number] DOB/AGE: February 26, 1960 55 y.o.  Admit date: 08/12/2015 Discharge date: 08/15/2015  Primary Care Physician:  No PCP Per Patient  Discharge Diagnoses:    Acute diastolic CHF . Alcohol abuse . MDD (major depressive disorder), recurrent severe, without psychosis (HCC) . Anemia . HTN (hypertension)   Consults:  Cardiology  Recommendations for Outpatient Follow-up:  1. Patient started on Lasix 20 mg daily, lisinopril increased to 20 mg daily  2. Please repeat CBC/BMET at next visit   DIET: Heart healthy low-sodium diet   Allergies:  No Known Allergies   DISCHARGE MEDICATIONS: Current Discharge Medication List    START taking these medications   Details  aspirin EC 81 MG EC tablet Take 1 tablet (81 mg total) by mouth daily. Qty: 30 tablet, Refills: 3    ferrous sulfate 325 (65 FE) MG tablet Take 1 tablet (325 mg total) by mouth daily with breakfast. Qty: 30 tablet, Refills: 3    folic acid (FOLVITE) 1 MG tablet Take 1 tablet (1 mg total) by mouth daily. Qty: 30 tablet, Refills: 1    furosemide (LASIX) 20 MG tablet Take 1 tablet (20 mg total) by mouth daily. Qty: 30 tablet, Refills: 3    pantoprazole (PROTONIX) 40 MG tablet Take 1 tablet (40 mg total) by mouth daily. Qty: 30 tablet, Refills: 1      CONTINUE these medications which have CHANGED   Details  lisinopril (PRINIVIL,ZESTRIL) 20 MG tablet Take 1 tablet (20 mg total) by mouth daily. Qty: 30 tablet, Refills: 4    thiamine 100 MG tablet Take 1 tablet (100 mg total) by mouth daily. For low thiamine Qty: 30 tablet, Refills: 0      STOP taking these medications     ibuprofen (ADVIL,MOTRIN) 200 MG tablet      DULoxetine (CYMBALTA) 60 MG capsule      gabapentin (NEURONTIN) 300 MG capsule      hydrOXYzine (ATARAX/VISTARIL) 25 MG tablet      nicotine (NICODERM CQ - DOSED IN MG/24 HOURS) 21 mg/24hr patch      traZODone (DESYREL) 150 MG  tablet          Brief H and P: For complete details please refer to admission H and P, but in brief Rodney Hartman a 55 y.o.malewith medical history significant for hypertension, major depressive disorder, ongoing alcoholism and ongoing tobacco abuse who [facility] awakening during the middle the night with chest tightness and pressure as well as shortness of breath with the degree of pleuritic component. He has not had any constitutional symptoms such as fevers chills or cough. He has noticed some slight increased lower extremity edema. He was recently hospitalized for 12 days at [facility] and has not had any alcoholic beverages for 24 hours prior to that admission. He has no prior cardiac or heart failure history and does not have a history of asthma or COPD by diagnosis.  Hospital Course:  Chest pain or dyspnea likely due to Acute Diastolic CHF heart failure  - patient presented with acute chest discomfort and shortness of breath, BNP 511, troponins essentially negative except  troponin x1 0.1. Chest x-ray showed evidence of CHF. Patient reports out of work for 2 months and has not been able to take antihypertensives during that time and this may be contributory - Transitioned to oral Lasix, negative balance of 3.4 L. weight195-> 185lbs at discharge - 2-D  echo showed EF of 55-60% with grade 1 diastolic dysfunction  - Cardiology has transitioned the patient to oral Lasix 20 mg daily.  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 He reported at the same time he began increasing his alcohol intake more than his baseline -Add PPI  HTN (hypertension) -BP currently stable, continue Lasix, lisinopril   MDD (major depressive disorder), recurrent severe, without psychosis  -Maintenance medications were changed since discharge from behavioral health Hospital and medications patient is currently receiving at the residential facility have been ordered  Anemia anemia panel showed iron 24, ferritin 100, likely anemia of chronic disease    Day of Discharge BP (!) 140/97 (BP Location: Left Arm)   Pulse 67   Temp 98 F (36.7 C) (Oral)   Resp 20   Ht 5\' 10"  (1.778 m)   Wt 84.1 kg (185 lb 4.8 oz)   SpO2 100%   BMI 26.59 kg/m   Physical Exam: General: Alert and awake oriented x3 not in any acute distress. HEENT: anicteric sclera, pupils reactive to light and accommodation CVS: S1-S2 clear no murmur rubs or gallops Chest: clear to auscultation bilaterally, no wheezing rales or rhonchi Abdomen: soft nontender, nondistended, normal bowel sounds Extremities: no cyanosis, clubbing or edema noted bilaterally Neuro: Cranial nerves II-XII intact, no focal neurological deficits   The results of significant diagnostics from this hospitalization (including imaging, microbiology, ancillary and laboratory) are listed below for reference.    LAB RESULTS: Basic Metabolic Panel:  Recent Labs Lab 08/12/15 1155  08/14/15 0526 08/15/15 0352  NA  --   < > 137 136  K  --   < > 3.8 3.7  CL  --   < > 100* 98*  CO2  --   < > 29 29  GLUCOSE  --   < > 113* 95  BUN  --   < > 13 10  CREATININE  --   < > 0.95 0.86  CALCIUM  --   < > 9.5 9.9  MG 1.8  --   --   --   < > = values in this interval not displayed. Liver Function Tests:  Recent Labs Lab 08/12/15 0740  AST 30  ALT 36  ALKPHOS 53  BILITOT 0.7  PROT 6.7  ALBUMIN 3.6    Recent Labs Lab 08/12/15 0740  LIPASE 23   No results for  input(s): AMMONIA in the last 168 hours. CBC:  Recent Labs Lab 08/12/15 0740  WBC 6.4  NEUTROABS 4.8  HGB 11.6*  HCT 34.6*  MCV 91.3  PLT 319   Cardiac Enzymes:  Recent Labs Lab 08/13/15 1352 08/13/15 1756  TROPONINI 0.03* <0.03   BNP: Invalid input(s): POCBNP CBG: No results for input(s): GLUCAP in the last 168 hours.  Significant Diagnostic Studies:  Dg Chest 2 View  Result Date: 08/12/2015 CLINICAL DATA:  Shortness of breath and chest pain for 1 week, progressed EXAM: CHEST  2 VIEW COMPARISON:  November 26, 2014 FINDINGS: Mild scarring in the left base is stable. There is generalized interstitial pulmonary edema. There  is no appreciable airspace consolidation. There is linear atelectasis in the right middle lobe. The heart is upper normal in size with pulmonary venous hypertension. No adenopathy. No bone lesions. IMPRESSION: Evidence of a degree of congestive heart failure. Stable scarring left base. Mild linear atelectasis in the right middle lobe. Electronically Signed   By: Bretta Bang III M.D.   On: 08/12/2015 08:08    2D ECHO: Study Conclusions  - Left ventricle: The cavity size was normal. Wall thickness was   normal. Systolic function was normal. The estimated ejection   fraction was in the range of 55% to 60%. Wall motion was normal;   there were no regional wall motion abnormalities. Doppler   parameters are consistent with abnormal left ventricular   relaxation (grade 1 diastolic dysfunction). Doppler parameters   are consistent with high ventricular filling pressure. - Pericardium, extracardiac: A trivial pericardial effusion was   identified.  Impressions:  - Normal LV systolic function; grade 1 diastolic dysfunction with   elevated LV filling pressure; trace TR.   Disposition and Follow-up: Discharge Instructions    (HEART FAILURE PATIENTS) Call MD:  Anytime you have any of the following symptoms: 1) 3 pound weight gain in 24 hours or 5  pounds in 1 week 2) shortness of breath, with or without a dry hacking cough 3) swelling in the hands, feet or stomach 4) if you have to sleep on extra pillows at night in order to breathe.    Complete by:  As directed   Diet - low sodium heart healthy    Complete by:  As directed   Increase activity slowly    Complete by:  As directed       DISPOSITION: back to [facility]   DISCHARGE FOLLOW-UP Follow-up Information    Olga Millers, MD. Schedule an appointment as soon as possible for a visit in 2 week(s).   Specialty:  Cardiology Contact information: [address] [place] [address] [phone]            Time spent on Discharge:   Signed:   RAI,RIPUDEEP M.D. [facility] 08/15/2015, 10:30 AM Pager: [phone]

## 2015-08-15 NOTE — Progress Notes (Signed)
We signed off yesterday.  Please call with questions.  Will arrange outpt appt.  For HF follow up.

## 2015-08-15 NOTE — Clinical Social Work Note (Signed)
CSW has made every attempt to get a response [facility] [place] [facility] returning to [facility] facility. Unfortunately [facility] has not returned CSW's calls and not their line is busy with every call. CSW has informed RN that the patient will still need to discharge from the hospital today and can follow up with [facility] post DC.   Roddie McBryant Natasia Sanko MSW, [place]CSW, [place], [phone]

## 2015-10-08 ENCOUNTER — Emergency Department (HOSPITAL_COMMUNITY): Payer: Self-pay

## 2015-10-08 ENCOUNTER — Encounter (HOSPITAL_COMMUNITY): Payer: Self-pay

## 2015-10-08 ENCOUNTER — Emergency Department (HOSPITAL_COMMUNITY)
Admission: EM | Admit: 2015-10-08 | Discharge: 2015-10-08 | Disposition: A | Discharge status: Home / Self Care | Payer: Self-pay | Attending: Emergency Medicine | Admitting: Emergency Medicine

## 2015-10-08 ENCOUNTER — Emergency Department (HOSPITAL_COMMUNITY)
Admission: EM | Admit: 2015-10-08 | Discharge: 2015-10-08 | Disposition: A | Discharge status: Home / Self Care | Payer: Self-pay | Attending: Dermatology | Admitting: Dermatology

## 2015-10-08 DIAGNOSIS — I10 Essential (primary) hypertension: Secondary | ICD-10-CM | POA: Insufficient documentation

## 2015-10-08 DIAGNOSIS — F10920 Alcohol use, unspecified with intoxication, uncomplicated: Secondary | ICD-10-CM

## 2015-10-08 DIAGNOSIS — F10120 Alcohol abuse with intoxication, uncomplicated: Secondary | ICD-10-CM | POA: Insufficient documentation

## 2015-10-08 DIAGNOSIS — Y999 Unspecified external cause status: Secondary | ICD-10-CM | POA: Insufficient documentation

## 2015-10-08 DIAGNOSIS — Z7982 Long term (current) use of aspirin: Secondary | ICD-10-CM | POA: Insufficient documentation

## 2015-10-08 DIAGNOSIS — S0181XA Laceration without foreign body of other part of head, initial encounter: Secondary | ICD-10-CM | POA: Insufficient documentation

## 2015-10-08 DIAGNOSIS — Z041 Encounter for examination and observation following transport accident: Secondary | ICD-10-CM | POA: Insufficient documentation

## 2015-10-08 DIAGNOSIS — Z5321 Procedure and treatment not carried out due to patient leaving prior to being seen by health care provider: Secondary | ICD-10-CM | POA: Insufficient documentation

## 2015-10-08 DIAGNOSIS — Y939 Activity, unspecified: Secondary | ICD-10-CM | POA: Insufficient documentation

## 2015-10-08 DIAGNOSIS — F17210 Nicotine dependence, cigarettes, uncomplicated: Secondary | ICD-10-CM | POA: Insufficient documentation

## 2015-10-08 DIAGNOSIS — W19XXXA Unspecified fall, initial encounter: Secondary | ICD-10-CM

## 2015-10-08 DIAGNOSIS — Y929 Unspecified place or not applicable: Secondary | ICD-10-CM | POA: Insufficient documentation

## 2015-10-08 DIAGNOSIS — R4182 Altered mental status, unspecified: Secondary | ICD-10-CM | POA: Insufficient documentation

## 2015-10-08 DIAGNOSIS — W1789XA Other fall from one level to another, initial encounter: Secondary | ICD-10-CM | POA: Insufficient documentation

## 2015-10-08 LAB — CBC WITH DIFFERENTIAL/PLATELET
Basophils Absolute: 0 10*3/uL (ref 0.0–0.1)
Basophils Relative: 1 %
Eosinophils Absolute: 0.1 10*3/uL (ref 0.0–0.7)
Eosinophils Relative: 2 %
HCT: 40.6 % (ref 39.0–52.0)
HEMOGLOBIN: 13.5 g/dL (ref 13.0–17.0)
LYMPHS PCT: 32 %
Lymphs Abs: 1.3 10*3/uL (ref 0.7–4.0)
MCH: 30.8 pg (ref 26.0–34.0)
MCHC: 33.3 g/dL (ref 30.0–36.0)
MCV: 92.5 fL (ref 78.0–100.0)
Monocytes Absolute: 0.2 10*3/uL (ref 0.1–1.0)
Monocytes Relative: 4 %
NEUTROS PCT: 61 %
Neutro Abs: 2.6 10*3/uL (ref 1.7–7.7)
Platelets: 288 10*3/uL (ref 150–400)
RBC: 4.39 MIL/uL (ref 4.22–5.81)
RDW: 15.1 % (ref 11.5–15.5)
WBC: 4.2 10*3/uL (ref 4.0–10.5)

## 2015-10-08 LAB — COMPREHENSIVE METABOLIC PANEL
ALT: 25 U/L (ref 17–63)
AST: 35 U/L (ref 15–41)
Albumin: 3.6 g/dL (ref 3.5–5.0)
Alkaline Phosphatase: 60 U/L (ref 38–126)
Anion gap: 13 (ref 5–15)
BUN: 5 mg/dL — AB (ref 6–20)
CHLORIDE: 106 mmol/L (ref 101–111)
CO2: 22 mmol/L (ref 22–32)
Calcium: 8.8 mg/dL — ABNORMAL LOW (ref 8.9–10.3)
Creatinine, Ser: 0.84 mg/dL (ref 0.61–1.24)
GFR calc Af Amer: >60 mL/min (ref >60)
GFR, EST NON AFRICAN AMERICAN: 52 mL/min — AB (ref >60)
Glucose, Bld: 94 mg/dL (ref 65–99)
POTASSIUM: 3.9 mmol/L (ref 3.5–5.1)
Sodium: 141 mmol/L (ref 135–145)
Total Bilirubin: 0.3 mg/dL (ref 0.3–1.2)
Total Protein: 6.7 g/dL (ref 6.5–8.1)

## 2015-10-08 LAB — ETHANOL: ALCOHOL ETHYL (B): 447 mg/dL — AB (ref <5)

## 2015-10-08 NOTE — ED Triage Notes (Signed)
Pt reports he was seen in hospital today and was discharged.  Pt wants to check back in for pain control.

## 2015-10-08 NOTE — ED Notes (Signed)
Notified Dr. Rennis Pettyf ETOH

## 2015-10-08 NOTE — ED Triage Notes (Signed)
Pt brought in by EMS due to pt falling and hitting head. Pt is altered and does not answer questions appropriately. Per EMS pt has been drinking alcohol all day and pt fell in gravel. Pt has laceration on right forehead.

## 2015-10-08 NOTE — ED Notes (Signed)
Patient with duplicate chart for this evening.  Patient left out of ED and reregistered.

## 2015-10-08 NOTE — Discharge Instructions (Signed)
Your wound on the forehead is been repaired with a Steri-Strip.  Please let this remain in place until it falls off naturally.
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
## 2015-10-08 NOTE — ED Notes (Signed)
MD Lockwood at bedside.  

## 2015-10-09 NOTE — ED Notes (Signed)
Ordered breakfast tray at 0551- TY  

## 2015-10-10 ENCOUNTER — Encounter (HOSPITAL_COMMUNITY): Payer: Self-pay

## 2015-12-29 DIAGNOSIS — Z7982 Long term (current) use of aspirin: Secondary | ICD-10-CM | POA: Insufficient documentation

## 2015-12-29 DIAGNOSIS — Y939 Activity, unspecified: Secondary | ICD-10-CM | POA: Insufficient documentation

## 2015-12-29 DIAGNOSIS — Y929 Unspecified place or not applicable: Secondary | ICD-10-CM | POA: Insufficient documentation

## 2015-12-29 DIAGNOSIS — Y999 Unspecified external cause status: Secondary | ICD-10-CM | POA: Insufficient documentation

## 2015-12-29 DIAGNOSIS — I10 Essential (primary) hypertension: Secondary | ICD-10-CM | POA: Insufficient documentation

## 2015-12-29 DIAGNOSIS — F10120 Alcohol abuse with intoxication, uncomplicated: Secondary | ICD-10-CM | POA: Insufficient documentation

## 2015-12-29 DIAGNOSIS — Z5321 Procedure and treatment not carried out due to patient leaving prior to being seen by health care provider: Secondary | ICD-10-CM | POA: Insufficient documentation

## 2015-12-29 DIAGNOSIS — F17210 Nicotine dependence, cigarettes, uncomplicated: Secondary | ICD-10-CM | POA: Insufficient documentation
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
## 2015-12-30 ENCOUNTER — Encounter (HOSPITAL_COMMUNITY): Payer: Self-pay | Admitting: Emergency Medicine

## 2015-12-30 ENCOUNTER — Emergency Department (HOSPITAL_COMMUNITY)
Admission: EM | Admit: 2015-12-30 | Discharge: 2015-12-30 | Disposition: A | Discharge status: Home / Self Care | Payer: Self-pay | Attending: Dermatology | Admitting: Dermatology

## 2015-12-30 NOTE — ED Notes (Signed)
V/S delayed due to pt being in the bathroom 
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
## 2016-01-22 ENCOUNTER — Encounter (HOSPITAL_COMMUNITY): Payer: Self-pay | Admitting: Nurse Practitioner

## 2016-01-22 ENCOUNTER — Emergency Department (HOSPITAL_COMMUNITY): Payer: Self-pay

## 2016-01-22 ENCOUNTER — Emergency Department (HOSPITAL_COMMUNITY)
Admission: EM | Admit: 2016-01-22 | Discharge: 2016-01-22 | Discharge status: Against Medical Advice | Payer: Self-pay | Attending: Emergency Medicine | Admitting: Emergency Medicine

## 2016-01-22 ENCOUNTER — Emergency Department (HOSPITAL_COMMUNITY)
Admission: EM | Admit: 2016-01-22 | Discharge: 2016-01-23 | Disposition: A | Discharge status: Other Acute Inpatient Hospital | Payer: Federal, State, Local not specified - Other | Attending: Emergency Medicine | Admitting: Emergency Medicine

## 2016-01-22 DIAGNOSIS — F17210 Nicotine dependence, cigarettes, uncomplicated: Secondary | ICD-10-CM | POA: Insufficient documentation

## 2016-01-22 DIAGNOSIS — Y999 Unspecified external cause status: Secondary | ICD-10-CM | POA: Insufficient documentation

## 2016-01-22 DIAGNOSIS — Z79899 Other long term (current) drug therapy: Secondary | ICD-10-CM | POA: Insufficient documentation

## 2016-01-22 DIAGNOSIS — F1014 Alcohol abuse with alcohol-induced mood disorder: Secondary | ICD-10-CM | POA: Insufficient documentation

## 2016-01-22 DIAGNOSIS — F1020 Alcohol dependence, uncomplicated: Secondary | ICD-10-CM | POA: Diagnosis present

## 2016-01-22 DIAGNOSIS — Z7982 Long term (current) use of aspirin: Secondary | ICD-10-CM | POA: Insufficient documentation

## 2016-01-22 DIAGNOSIS — M79641 Pain in right hand: Secondary | ICD-10-CM | POA: Insufficient documentation

## 2016-01-22 DIAGNOSIS — Y929 Unspecified place or not applicable: Secondary | ICD-10-CM | POA: Insufficient documentation

## 2016-01-22 DIAGNOSIS — F10920 Alcohol use, unspecified with intoxication, uncomplicated: Secondary | ICD-10-CM

## 2016-01-22 DIAGNOSIS — W19XXXA Unspecified fall, initial encounter: Secondary | ICD-10-CM | POA: Insufficient documentation

## 2016-01-22 DIAGNOSIS — I5089 Other heart failure: Secondary | ICD-10-CM | POA: Insufficient documentation

## 2016-01-22 DIAGNOSIS — Y939 Activity, unspecified: Secondary | ICD-10-CM | POA: Insufficient documentation

## 2016-01-22 DIAGNOSIS — I509 Heart failure, unspecified: Secondary | ICD-10-CM | POA: Insufficient documentation

## 2016-01-22 DIAGNOSIS — F1094 Alcohol use, unspecified with alcohol-induced mood disorder: Secondary | ICD-10-CM | POA: Diagnosis present

## 2016-01-22 DIAGNOSIS — I110 Hypertensive heart disease with heart failure: Secondary | ICD-10-CM | POA: Insufficient documentation

## 2016-01-22 DIAGNOSIS — F10120 Alcohol abuse with intoxication, uncomplicated: Secondary | ICD-10-CM | POA: Insufficient documentation

## 2016-01-22 DIAGNOSIS — M25511 Pain in right shoulder: Secondary | ICD-10-CM | POA: Insufficient documentation

## 2016-01-22 LAB — COMPREHENSIVE METABOLIC PANEL
ALK PHOS: 68 U/L (ref 38–126)
ALT: 17 U/L (ref 17–63)
AST: 24 U/L (ref 15–41)
Albumin: 4.4 g/dL (ref 3.5–5.0)
Anion gap: 9 (ref 5–15)
BILIRUBIN TOTAL: 0.9 mg/dL (ref 0.3–1.2)
BUN: 7 mg/dL (ref 6–20)
CALCIUM: 9.2 mg/dL (ref 8.9–10.3)
CO2: 28 mmol/L (ref 22–32)
CREATININE: 0.92 mg/dL (ref 0.61–1.24)
Chloride: 105 mmol/L (ref 101–111)
Glucose, Bld: 108 mg/dL — ABNORMAL HIGH (ref 65–99)
Potassium: 3.6 mmol/L (ref 3.5–5.1)
Sodium: 142 mmol/L (ref 135–145)
Total Protein: 8.3 g/dL — ABNORMAL HIGH (ref 6.5–8.1)

## 2016-01-22 LAB — SALICYLATE LEVEL

## 2016-01-22 LAB — ACETAMINOPHEN LEVEL
Acetaminophen (Tylenol), Serum: <10 ug/mL — ABNORMAL LOW (ref 10–30)
Acetaminophen (Tylenol), Serum: <10 ug/mL — ABNORMAL LOW (ref 10–30)

## 2016-01-22 LAB — RAPID URINE DRUG SCREEN, HOSP PERFORMED
AMPHETAMINES: NOT DETECTED
Barbiturates: NOT DETECTED
Benzodiazepines: NOT DETECTED
Cocaine: NOT DETECTED
OPIATES: NOT DETECTED
TETRAHYDROCANNABINOL: POSITIVE — AB

## 2016-01-22 LAB — CBG MONITORING, ED: GLUCOSE-CAPILLARY: 95 mg/dL (ref 65–99)

## 2016-01-22 LAB — CBC
HCT: 38.6 % — ABNORMAL LOW (ref 39.0–52.0)
Hemoglobin: 13.3 g/dL (ref 13.0–17.0)
MCH: 28.8 pg (ref 26.0–34.0)
MCHC: 34.5 g/dL (ref 30.0–36.0)
MCV: 83.5 fL (ref 78.0–100.0)
PLATELETS: 305 10*3/uL (ref 150–400)
RBC: 4.62 MIL/uL (ref 4.22–5.81)
RDW: 14.2 % (ref 11.5–15.5)
WBC: 5.6 10*3/uL (ref 4.0–10.5)

## 2016-01-22 LAB — ETHANOL: Alcohol, Ethyl (B): 258 mg/dL — ABNORMAL HIGH (ref <5)

## 2016-01-22 MED ORDER — THIAMINE HCL 100 MG/ML IJ SOLN: 100.0000 mg | Freq: Every day | INTRAMUSCULAR | Status: DC

## 2016-01-22 MED ORDER — LORAZEPAM 1 MG PO TABS
1.0000 mg | ORAL_TABLET | Freq: Four times a day (QID) | ORAL | Status: DC | PRN
  Administered 2016-01-22: 1 mg via ORAL

## 2016-01-22 MED ORDER — VITAMIN B-1 100 MG PO TABS
100.0000 mg | ORAL_TABLET | Freq: Every day | ORAL | Status: DC
  Administered 2016-01-22 – 2016-01-23 (×2): 100 mg via ORAL
  Filled 2016-01-22 (×2): qty 1

## 2016-01-22 MED ORDER — IBUPROFEN 200 MG PO TABS: 600.0000 mg | ORAL_TABLET | Freq: Three times a day (TID) | ORAL | Status: DC | PRN

## 2016-01-22 MED ORDER — PANTOPRAZOLE SODIUM 40 MG PO TBEC
40.0000 mg | DELAYED_RELEASE_TABLET | Freq: Every day | ORAL | Status: DC
  Administered 2016-01-22 – 2016-01-23 (×2): 40 mg via ORAL
  Filled 2016-01-22 (×2): qty 1

## 2016-01-22 MED ORDER — ZOLPIDEM TARTRATE 5 MG PO TABS: 5.0000 mg | ORAL_TABLET | Freq: Every evening | ORAL | Status: DC | PRN

## 2016-01-22 MED ORDER — SODIUM CHLORIDE 0.9 % IV SOLN
Freq: Once | INTRAVENOUS | Status: AC
  Administered 2016-01-22: 07:00:00 via INTRAVENOUS

## 2016-01-22 MED ORDER — LOPERAMIDE HCL 2 MG PO CAPS: 2.0000 mg | ORAL_CAPSULE | ORAL | Status: DC | PRN

## 2016-01-22 MED ORDER — LORAZEPAM 2 MG/ML IJ SOLN: 1.0000 mg | Freq: Four times a day (QID) | INTRAMUSCULAR | Status: DC | PRN

## 2016-01-22 MED ORDER — DICYCLOMINE HCL 20 MG PO TABS
20.0000 mg | ORAL_TABLET | Freq: Four times a day (QID) | ORAL | Status: DC | PRN
  Administered 2016-01-22: 20 mg via ORAL
  Filled 2016-01-22: qty 1

## 2016-01-22 MED ORDER — KETOROLAC TROMETHAMINE 30 MG/ML IJ SOLN
30.0000 mg | Freq: Once | INTRAMUSCULAR | Status: AC
  Administered 2016-01-22: 30 mg via INTRAMUSCULAR
  Filled 2016-01-22: qty 1

## 2016-01-22 MED ORDER — ADULT MULTIVITAMIN W/MINERALS CH
1.0000 | ORAL_TABLET | Freq: Every day | ORAL | Status: DC
  Administered 2016-01-22 – 2016-01-23 (×2): 1 via ORAL
  Filled 2016-01-22 (×2): qty 1

## 2016-01-22 MED ORDER — FUROSEMIDE 20 MG PO TABS
20.0000 mg | ORAL_TABLET | Freq: Every day | ORAL | Status: DC
  Administered 2016-01-23: 20 mg via ORAL
  Filled 2016-01-22 (×2): qty 1

## 2016-01-22 MED ORDER — ALUM & MAG HYDROXIDE-SIMETH 200-200-20 MG/5ML PO SUSP: 30.0000 mL | ORAL | Status: DC | PRN

## 2016-01-22 MED ORDER — TRAZODONE HCL 100 MG PO TABS
100.0000 mg | ORAL_TABLET | Freq: Every evening | ORAL | Status: DC | PRN
  Administered 2016-01-22: 100 mg via ORAL
  Filled 2016-01-22: qty 1

## 2016-01-22 MED ORDER — LISINOPRIL 20 MG PO TABS
20.0000 mg | ORAL_TABLET | Freq: Every day | ORAL | Status: DC
  Administered 2016-01-22 – 2016-01-23 (×2): 20 mg via ORAL
  Filled 2016-01-22 (×2): qty 1

## 2016-01-22 MED ORDER — LORAZEPAM 1 MG PO TABS
0.0000 mg | ORAL_TABLET | Freq: Four times a day (QID) | ORAL | Status: DC
Start: 2016-01-22 — End: 2016-01-23
  Administered 2016-01-23: 1 mg via ORAL
  Filled 2016-01-22 (×2): qty 1

## 2016-01-22 MED ORDER — ACETAMINOPHEN 325 MG PO TABS: 650.0000 mg | ORAL_TABLET | ORAL | Status: DC | PRN

## 2016-01-22 MED ORDER — ASPIRIN EC 81 MG PO TBEC
81.0000 mg | DELAYED_RELEASE_TABLET | Freq: Every day | ORAL | Status: DC
  Administered 2016-01-22 – 2016-01-23 (×2): 81 mg via ORAL
  Filled 2016-01-22 (×2): qty 1

## 2016-01-22 MED ORDER — FERROUS SULFATE 325 (65 FE) MG PO TABS
325.0000 mg | ORAL_TABLET | Freq: Every day | ORAL | Status: DC
  Administered 2016-01-23: 325 mg via ORAL
  Filled 2016-01-22: qty 1

## 2016-01-22 MED ORDER — ONDANSETRON HCL 4 MG PO TABS: 4.0000 mg | ORAL_TABLET | Freq: Three times a day (TID) | ORAL | Status: DC | PRN

## 2016-01-22 MED ORDER — THIAMINE HCL 100 MG PO TABS: 100.0000 mg | ORAL_TABLET | Freq: Every day | ORAL | Status: DC

## 2016-01-22 MED ORDER — FOLIC ACID 1 MG PO TABS
1.0000 mg | ORAL_TABLET | Freq: Every day | ORAL | Status: DC
  Administered 2016-01-22 – 2016-01-23 (×2): 1 mg via ORAL
  Filled 2016-01-22 (×2): qty 1

## 2016-01-22 MED ORDER — FOLIC ACID 1 MG PO TABS: 1.0000 mg | ORAL_TABLET | Freq: Every day | ORAL | Status: DC

## 2016-01-22 MED ORDER — LORAZEPAM 1 MG PO TABS: 0.0000 mg | ORAL_TABLET | Freq: Two times a day (BID) | ORAL | Status: DC

## 2016-01-22 NOTE — ED Notes (Signed)
Pair of blue jeans, pair of red and white Nikes, pair of white socks, tan shirt, black and red shirt, a mastercard, pair of black and white pajama pants, and a brown carheart jacket.

## 2016-01-22 NOTE — ED Notes (Signed)
Poison Control called, closing out patient case.
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
## 2016-01-22 NOTE — ED Triage Notes (Signed)
Pt is c/o right hand pain with shoulder pain. Questionable hx from pt due to obvious intoxication and incoherent speech. Chart review indicates hx of alcohol abuse.

## 2016-01-22 NOTE — ED Notes (Signed)
Poison control notified due to pt ingesting appx 36 Hydroxyzine 25mg , and advisement included IV fluids, 6hr monitoring for CNS depression and cardiac changes, 4hr repeat Tylenol level and must urinate before clearance. Dr.Horton notified of recommendations.
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
## 2016-01-22 NOTE — ED Triage Notes (Signed)
Pt presents from home via POV. States "my [birth year] is [birth year] and things isn't going the way I planned this weekend so I took pills to hurt myself even though I know I shouldn't. At this point I don't care what happens to me. Life isn't going right and I just don't care."

## 2016-01-22 NOTE — ED Notes (Signed)
Pt admitted to room #40, pt reports "I'm tired." Pt guarded, forwards little with this nurse. Pt endorsing passive SI. Denies HI. Denies AVH. Pt denies withdrawal symptoms. Encouragement and support provided. Special checks q 15 mins in place for safety. Video monitoring in place. Will continue to monitor.

## 2016-01-22 NOTE — ED Provider Notes (Signed)
[facility]-EMERGENCY DEPT Provider Note   CSN: [id number] Arrival date & time: 01/22/16  4098     History   Chief Complaint Chief Complaint  Patient presents with  . Suicidal  . Drug Overdose    HPI Rodney Hartman is a 56 y.o. male.  HPI Patient presents to the emergency department with overdose.  The patient states that he took a handful of hydroxyzine because he was upset and depressed.  The patient states that he had been drinking last night.  He states that he has chronic pain in his back and arm.  The patient states that seems make the condition better or worse.  The patient states that he does not have any hallucinationsThe patient denies chest pain, shortness of breath, headache,blurred vision, neck pain, fever, cough, weakness, numbness, dizziness, anorexia, edema, abdominal pain, nausea, vomiting, diarrhea, rash,  dysuria, hematemesis, bloody stool, near syncope, or syncope. Past Medical History:  Diagnosis Date  . Alcohol abuse   . Depression   . Hypertension   . PUD (peptic ulcer disease)     Patient Active Problem List   Diagnosis Date Noted  . Atypical chest pain   . Acute congestive heart failure (HCC) 08/12/2015  . Anemia 08/12/2015  . HTN (hypertension) 08/12/2015  . Alcohol abuse with intoxication, uncomplicated (HCC) 07/31/2015  . MDD (major depressive disorder), recurrent severe, without psychosis (HCC) 07/31/2015  . Suicidal ideation 07/31/2015  . Homicidal ideation 07/31/2015  . Alcohol-induced mood disorder (HCC) 01/03/2015  . Alcohol use disorder, severe, dependence (HCC) 12/17/2014  . Alcohol abuse 08/07/2014    Past Surgical History:  Procedure Laterality Date  . BACK SURGERY    . 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   . TONSILLECTOMY         Home Medications    Prior to Admission medications   Medication Sig Start Date End Date Taking? Authorizing Provider    aspirin EC 81 MG EC tablet Take 1 tablet (81 mg total) by mouth daily. 08/15/15   Ripudeep Jenna Luo, MD  ferrous sulfate 325 (65 FE) MG tablet Take 1 tablet (325 mg total) by mouth daily with breakfast. 08/15/15   Ripudeep Jenna Luo, MD  folic acid (FOLVITE) 1 MG tablet Take 1 tablet (1 mg total) by mouth daily. 08/15/15   Ripudeep Jenna Luo, MD  furosemide (LASIX) 20 MG tablet Take 1 tablet (20 mg total) by mouth daily. 08/15/15   Ripudeep Jenna Luo, MD  lisinopril (PRINIVIL,ZESTRIL) 20 MG tablet Take 1 tablet (20 mg total) by mouth daily. 08/15/15   Ripudeep Jenna Luo, MD  pantoprazole (PROTONIX) 40 MG tablet Take 1 tablet (40 mg total) by mouth daily. 08/15/15   Ripudeep Jenna Luo, MD  thiamine 100 MG tablet Take 1 tablet (100 mg total) by mouth daily. For low thiamine 08/15/15   Ripudeep Jenna Luo, MD    Family History Family History  Problem Relation Age of Onset  . Heart disease Mother     Social History Social History  Substance Use Topics  . Smoking status: Current Some Day Smoker    Packs/day: 0.50    Types: Cigarettes  . Smokeless tobacco: Never Used  . Alcohol use 3.6 oz/week    6 Cans of beer per week     Comment: every other day      Allergies   Patient has no known allergies.   Review  of Systems Review of Systems   Physical Exam Updated Vital Signs BP 128/83   Pulse 89   Resp 18   Ht 5\' 11"  (1.803 m)   Wt 79.4 kg   SpO2 98%   BMI 24.41 kg/m   Physical Exam  Constitutional: He is oriented to person, place, and time. He appears well-developed and well-nourished. No distress.  HENT:  Head: Normocephalic and atraumatic.  Mouth/Throat: Oropharynx is clear and moist.  Eyes: Pupils are equal, round, and reactive to light.  Neck: Normal range of motion. Neck supple.  Cardiovascular: Normal rate, regular rhythm and normal heart sounds.  Exam reveals no gallop and no friction rub.   No murmur heard. Pulmonary/Chest: Effort normal and breath sounds normal. No respiratory distress. He has no  wheezes.  Abdominal: Soft. Bowel sounds are normal. He exhibits no distension. There is no tenderness.  Neurological: He is alert and oriented to person, place, and time. He exhibits normal muscle tone. Coordination normal.  Skin: Skin is warm and dry. No rash noted. No erythema.  Psychiatric: His behavior is normal. Judgment normal. His mood appears anxious. His affect is blunt. He is not actively hallucinating. Cognition and memory are normal. He expresses suicidal ideation. He expresses no homicidal ideation. He expresses no suicidal plans and no homicidal plans.  Nursing note and vitals reviewed.    ED Treatments / Results  Labs (all labs ordered are listed, but only abnormal results are displayed) Labs Reviewed  COMPREHENSIVE METABOLIC PANEL - Abnormal; Notable for the following:       Result Value   Glucose, Bld 108 (*)    Total Protein 8.3 (*)    All other components within normal limits  ETHANOL - Abnormal; Notable for the following:    Alcohol, Ethyl (B) 258 (*)    All other components within normal limits  ACETAMINOPHEN LEVEL - Abnormal; Notable for the following:    Acetaminophen (Tylenol), Serum <10 (*)    All other components within normal limits  CBC - Abnormal; Notable for the following:    HCT 38.6 (*)    All other components within normal limits  RAPID URINE DRUG SCREEN, HOSP PERFORMED - Abnormal; Notable for the following:    Tetrahydrocannabinol POSITIVE (*)    All other components within normal limits  ACETAMINOPHEN LEVEL - Abnormal; Notable for the following:    Acetaminophen (Tylenol), Serum <10 (*)    All other components within normal limits  SALICYLATE LEVEL  CBG MONITORING, ED    EKG  EKG Interpretation None       Radiology Dg Cervical Spine Complete  Result Date: 01/22/2016 CLINICAL DATA:  Right hand and right shoulder pain. Fall. History of alcohol abuse and currently intoxicated incoherent. EXAM: CERVICAL SPINE - COMPLETE 4+ VIEW COMPARISON:   CT cervical spine 11/26/2014 FINDINGS: Postoperative changes with bone fusion and posterior spinous process cerclage wires from C3 through C5. Fused segments appear intact and alignment is unchanged. Straightening of usual cervical lordosis is likely postoperative. Degenerative changes at C5-6 and C6-7 levels. No vertebral compression deformities. No prevertebral soft tissue swelling. Posterior elements demonstrate normal alignment. No focal bone lesion or bone destruction. Prominent right transverse process at C7. C1-2 articulation appears intact. IMPRESSION: Postoperative changes with fusion from C3 through C5. No acute displaced fractures identified. Electronically Signed   By: Burman Nieves M.D.   On: 01/22/2016 03:48   Dg Shoulder Right  Result Date: 01/22/2016 CLINICAL DATA:  Right hand and shoulder pain after a  fall. History of alcohol abuse with current intoxication and in coherent speech. EXAM: RIGHT SHOULDER - 2+ VIEW COMPARISON:  None. FINDINGS: No evidence of acute fracture or dislocation of the right shoulder. Degenerative changes in the acromioclavicular joint. No focal bone lesion or bone destruction. Bone cortex appears intact. Soft tissues are unremarkable. IMPRESSION: No acute bony abnormalities. Electronically Signed   By: Burman NievesWilliam  Stevens M.D.   On: 01/22/2016 03:49   Dg Hand Complete Right  Result Date: 01/22/2016 CLINICAL DATA:  Right hand and shoulder pain after a fall. History of alcohol abuse with current intoxication and in coherent speech. EXAM: RIGHT HAND - COMPLETE 3+ VIEW COMPARISON:  Right wrist 04/05/2014 FINDINGS: Degenerative changes in the proximal and distal interphalangeal joints of the right hand as well as the first and second metacarpal phalangeal joint. Slight subluxation at the fourth proximal interphalangeal joint, likely degenerative. Widening of the scapholunate space suggesting scapholunate ligament injury. This is similar to previous study, suggesting  chronic injury. No evidence of acute fracture or dislocation. No focal bone lesion or bone destruction. Soft tissues are unremarkable. IMPRESSION: Degenerative changes in the right hand. 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 This is unchanged since previous study and likely represents a chronic process. No acute fracture or dislocation demonstrated. Electronically Signed   By: Burman NievesWilliam  Stevens M.D.   On: 01/22/2016 03:51    Procedures Procedures (including critical care time)  Medications Ordered in ED Medications  acetaminophen (TYLENOL) tablet 650 mg (not administered)  ibuprofen (ADVIL,MOTRIN) tablet 600 mg (not administered)  zolpidem (AMBIEN) tablet 5 mg (not administered)  ondansetron (ZOFRAN) tablet 4 mg (not administered)  alum & mag hydroxide-simeth (MAALOX/MYLANTA) 200-200-20 MG/5ML suspension 30 mL (not administered)  aspirin EC tablet 81 mg (81 mg Oral Given 01/22/16 1311)  ferrous sulfate tablet 325 mg (not administered)  lisinopril (PRINIVIL,ZESTRIL) tablet 20 mg (20 mg Oral Given 01/22/16 1311)  pantoprazole (PROTONIX) EC tablet 40 mg (40 mg Oral Given 01/22/16 1311)  LORazepam (ATIVAN) tablet 1 mg (not administered)    Or  LORazepam (ATIVAN) injection 1 mg (not administered)  thiamine (VITAMIN B-1) tablet 100 mg (100 mg Oral Given 01/22/16 1311)    Or  thiamine (B-1) injection 100 mg ( Intravenous See Alternative 01/22/16 1311)  folic acid (FOLVITE) tablet 1 mg (1 mg Oral Given 01/22/16 1311)  multivitamin with minerals tablet 1 tablet (1 tablet Oral Given 01/22/16 1311)  LORazepam (ATIVAN) tablet 0-4 mg (0 mg Oral Not Given 01/22/16 1325)    Followed by  LORazepam (ATIVAN) tablet 0-4 mg (not administered)  0.9 %  sodium chloride infusion ( Intravenous Stopped 01/22/16 0808)     Initial Impression / Assessment and Plan / ED Course  I have reviewed the triage vital signs and the nursing notes.  Pertinent labs & imaging results that were  available during my care of the patient were reviewed by me and considered in my medical decision making (see chart for details).  Clinical Course     Patient need TTS assessment for his intentional overdose  Final Clinical Impressions(s) / ED Diagnoses   Final diagnoses:  None    New Prescriptions New Prescriptions   No medications on file     Charlestine NightChristopher Caliope Ruppert, PA-C 01/23/16 1605    Shon Batonourtney F Horton, MD 01/23/16 2259

## 2016-01-23 DIAGNOSIS — Z7982 Long term (current) use of aspirin: Secondary | ICD-10-CM

## 2016-01-23 DIAGNOSIS — F1014 Alcohol abuse with alcohol-induced mood disorder: Secondary | ICD-10-CM

## 2016-01-23 DIAGNOSIS — F17210 Nicotine dependence, cigarettes, uncomplicated: Secondary | ICD-10-CM | POA: Diagnosis not present

## 2016-01-23 DIAGNOSIS — Z9889 Other specified postprocedural states: Secondary | ICD-10-CM

## 2016-01-23 DIAGNOSIS — Z8249 Family history of ischemic heart disease and other diseases of the circulatory system: Secondary | ICD-10-CM | POA: Diagnosis not present

## 2016-01-23 DIAGNOSIS — Z79899 Other long term (current) drug therapy: Secondary | ICD-10-CM

## 2016-01-23 DIAGNOSIS — R45851 Suicidal ideations: Secondary | ICD-10-CM

## 2016-01-23 MED ORDER — FLUOXETINE HCL 20 MG PO CAPS
20.0000 mg | ORAL_CAPSULE | Freq: Every day | ORAL | Status: DC
  Administered 2016-01-23: 20 mg via ORAL
  Filled 2016-01-23: qty 1

## 2016-01-23 NOTE — Consult Note (Signed)
[place] Psychiatry Consult   Reason for Consult:  Alcohol abuse with suicidal ideations Referring Physician:  EDP Patient Identification: Rodney Hartman MRN:  [id number] Principal Diagnosis: Alcohol abuse with alcohol-induced mood disorder [facility]) Diagnosis:   Patient Active Problem List   Diagnosis Date Noted  . Alcohol abuse with alcohol-induced mood disorder ([place]) [F10.14] 01/25/2015    Priority: High  . Alcohol use disorder, severe, dependence ([place]) [F10.20] 12/17/2014    Priority: High  . Atypical chest pain [R07.89]   . Acute congestive heart failure ([place]) [I50.9] 08/12/2015  . Anemia [D64.9] 08/12/2015  . HTN (hypertension) [I10] 08/12/2015  . Alcohol abuse with intoxication, uncomplicated ([place]) [Q75.916] 07/31/2015  . MDD (major depressive disorder), recurrent severe, without psychosis ([place]) [F33.2] 07/31/2015  . Suicidal ideation [R45.851] 07/31/2015  . Homicidal ideation [R45.850] 07/31/2015  . Alcohol abuse [F10.10] 08/07/2014    Total Time spent with patient: 45 minutes  Subjective:   Rodney Hartman is a 56 y.o. male patient admitted the Observation Unit with depression and suicidal ideations.  Reports an overdose of Vistaril   HPI:  56 yo male who presented to the ED under the influence of alcohol with suicidal ideations and plan to overdose.  He is clearing the alcohol and is having passive suicidal ideations with no plan or intent at this time.  No homicidal ideations or withdrawal symptoms.    Past Psychiatric History: substance abuse, depression  Risk to Self: None Risk to Others: Homicidal Ideation: No Thoughts of Harm to Others: No Current Homicidal Intent: No Current Homicidal Plan: No Access to Homicidal Means: No Identified Victim:  (n/a) History of harm to others?: No Assessment of Violence: None Noted Violent Behavior Description:  (Patient is calm and cooperative ) Does patient have access to weapons?: No Criminal Charges Pending?: No Does  patient have a court date: No Prior Inpatient Therapy: Prior Inpatient Therapy: Yes Prior Therapy Dates:  (12/20/2014, 07/30/2015, 01/24/15, 07/25/2015) Prior Therapy Facilty/Provider(s):  [facility]) Reason for Treatment:  (Alcohol Use, Depression, Suicidal thoughts ) Prior Outpatient Therapy: Prior Outpatient Therapy: Yes Prior Therapy Dates:  (current) Prior Therapy Facilty/Provider(s):  [facility] ) Reason for Treatment:  (med managment ) Does patient have an ACCT team?: No Does patient have Intensive In-House Services?  : No Does patient have [facility] services? : No Does patient have P4CC services?: No  Past Medical History:  Past Medical History:  Diagnosis Date  . Alcohol abuse   . Depression   . Hypertension   . PUD (peptic ulcer disease)     Past Surgical History:  Procedure Laterality Date  . BACK SURGERY    . 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   . TONSILLECTOMY     Family History:  Family History  Problem Relation Age of Onset  . Heart disease Mother    Family Psychiatric  History: none Social History:  History  Alcohol Use  . 3.6 oz/week  . 6 Cans of beer per week    Comment: every other day      History  Drug Use No    Social History   Social History  . Marital status: Legally Separated    Spouse name: N/A  . Number of children: N/A  . Years of education: N/A   Social History Main Topics  . Smoking status: Current Some Day Smoker    Packs/day: 0.50    Types:  Cigarettes  . Smokeless tobacco: Never Used  . Alcohol use 3.6 oz/week    6 Cans of beer per week     Comment: every other day   . Drug use: No  . Sexual activity: Yes    Birth control/ protection: None   Other Topics Concern  . None   Social History Narrative   ** Merged History Encounter **       ** Merged History Encounter **       Additional Social History:    Allergies:  No Known Allergies  Labs:   Results for orders placed or performed during the hospital encounter of 01/22/16 (from the past 48 hour(s))  Rapid urine drug screen (hospital performed)     Status: Abnormal   Collection Time: 01/22/16  5:07 AM  Result Value Ref Range   Opiates NONE DETECTED NONE DETECTED   Cocaine NONE DETECTED NONE DETECTED   Benzodiazepines NONE DETECTED NONE DETECTED   Amphetamines NONE DETECTED NONE DETECTED   Tetrahydrocannabinol POSITIVE (A) NONE DETECTED   Barbiturates NONE DETECTED NONE DETECTED    Comment:        DRUG SCREEN FOR MEDICAL PURPOSES ONLY.  IF CONFIRMATION IS NEEDED FOR ANY PURPOSE, NOTIFY LAB WITHIN 5 DAYS.        LOWEST DETECTABLE LIMITS FOR URINE DRUG SCREEN Drug Class       Cutoff (ng/mL) Amphetamine      1000 Barbiturate      200 Benzodiazepine   419 Tricyclics       622 Opiates          300 Cocaine          300 THC              50   CBG monitoring, ED     Status: None   Collection Time: 01/22/16  5:26 AM  Result Value Ref Range   Glucose-Capillary 95 65 - 99 mg/dL   Comment 1 Notify RN    Comment 2 Document in Chart   Comprehensive metabolic panel     Status: Abnormal   Collection Time: 01/22/16  5:31 AM  Result Value Ref Range   Sodium 142 135 - 145 mmol/L   Potassium 3.6 3.5 - 5.1 mmol/L   Chloride 105 101 - 111 mmol/L   CO2 28 22 - 32 mmol/L   Glucose, Bld 108 (H) 65 - 99 mg/dL   BUN 7 6 - 20 mg/dL   Creatinine, Ser 0.92 0.61 - 1.24 mg/dL   Calcium 9.2 8.9 - 10.3 mg/dL   Total Protein 8.3 (H) 6.5 - 8.1 g/dL   Albumin 4.4 3.5 - 5.0 g/dL   AST 24 15 - 41 U/L   ALT 17 17 - 63 U/L   Alkaline Phosphatase 68 38 - 126 U/L   Total Bilirubin 0.9 0.3 - 1.2 mg/dL   GFR calc non Af Amer >60 >60 mL/min   GFR calc Af Amer >60 >60 mL/min    Comment: (NOTE) The eGFR has been calculated using the CKD EPI equation. This calculation has not been validated in all clinical situations. eGFR's persistently <60 mL/min signify possible Chronic Kidney Disease.     Anion gap 9 5 - 15  Ethanol     Status: Abnormal   Collection Time: 01/22/16  5:31 AM  Result Value Ref Range   Alcohol, Ethyl (B) 258 (H) <5 mg/dL    Comment:        LOWEST DETECTABLE LIMIT FOR SERUM  ALCOHOL IS 5 mg/dL FOR MEDICAL PURPOSES ONLY   Salicylate level     Status: None   Collection Time: 01/22/16  5:31 AM  Result Value Ref Range   Salicylate Lvl <2.2 2.8 - 30.0 mg/dL  Acetaminophen level     Status: Abnormal   Collection Time: 01/22/16  5:31 AM  Result Value Ref Range   Acetaminophen (Tylenol), Serum <10 (L) 10 - 30 ug/mL    Comment:        THERAPEUTIC CONCENTRATIONS VARY SIGNIFICANTLY. A RANGE OF 10-30 ug/mL MAY BE AN EFFECTIVE CONCENTRATION FOR MANY PATIENTS. HOWEVER, SOME ARE BEST TREATED AT CONCENTRATIONS OUTSIDE THIS RANGE. ACETAMINOPHEN CONCENTRATIONS >150 ug/mL AT 4 HOURS AFTER INGESTION AND >50 ug/mL AT 12 HOURS AFTER INGESTION ARE OFTEN ASSOCIATED WITH TOXIC REACTIONS.   cbc     Status: Abnormal   Collection Time: 01/22/16  5:31 AM  Result Value Ref Range   WBC 5.6 4.0 - 10.5 K/uL   RBC 4.62 4.22 - 5.81 MIL/uL   Hemoglobin 13.3 13.0 - 17.0 g/dL   HCT 38.6 (L) 39.0 - 52.0 %   MCV 83.5 78.0 - 100.0 fL   MCH 28.8 26.0 - 34.0 pg   MCHC 34.5 30.0 - 36.0 g/dL   RDW 14.2 11.5 - 15.5 %   Platelets 305 150 - 400 K/uL  Acetaminophen level     Status: Abnormal   Collection Time: 01/22/16  9:40 AM  Result Value Ref Range   Acetaminophen (Tylenol), Serum <10 (L) 10 - 30 ug/mL    Comment:        THERAPEUTIC CONCENTRATIONS VARY SIGNIFICANTLY. A RANGE OF 10-30 ug/mL MAY BE AN EFFECTIVE CONCENTRATION FOR MANY PATIENTS. HOWEVER, SOME ARE BEST TREATED AT CONCENTRATIONS OUTSIDE THIS RANGE. ACETAMINOPHEN CONCENTRATIONS >150 ug/mL AT 4 HOURS AFTER INGESTION AND >50 ug/mL AT 12 HOURS AFTER INGESTION ARE OFTEN ASSOCIATED WITH TOXIC REACTIONS.     Current Facility-Administered Medications  Medication Dose Route Frequency Provider Last Rate Last Dose  .  acetaminophen (TYLENOL) tablet 650 mg  650 mg Oral Q4H PRN Dalia Heading, PA-C      . alum & mag hydroxide-simeth (MAALOX/MYLANTA) 200-200-20 MG/5ML suspension 30 mL  30 mL Oral PRN Dalia Heading, PA-C      . aspirin EC tablet 81 mg  81 mg Oral Daily Patrecia Pour, NP   81 mg at 01/23/16 0916  . dicyclomine (BENTYL) tablet 20 mg  20 mg Oral Q6H PRN Rozetta Nunnery, NP   20 mg at 01/22/16 2143  . ferrous sulfate tablet 325 mg  325 mg Oral Q breakfast Patrecia Pour, NP   325 mg at 01/23/16 0816  . folic acid (FOLVITE) tablet 1 mg  1 mg Oral Daily Patrecia Pour, NP   1 mg at 01/23/16 4825  . furosemide (LASIX) tablet 20 mg  20 mg Oral Daily Dalia Heading, PA-C   20 mg at 01/23/16 0037  . ibuprofen (ADVIL,MOTRIN) tablet 600 mg  600 mg Oral Q8H PRN Dalia Heading, PA-C      . lisinopril (PRINIVIL,ZESTRIL) tablet 20 mg  20 mg Oral Daily Patrecia Pour, NP   20 mg at 01/23/16 0917  . loperamide (IMODIUM) capsule 2-4 mg  2-4 mg Oral PRN Rozetta Nunnery, NP      . LORazepam (ATIVAN) tablet 1 mg  1 mg Oral Q6H PRN Patrecia Pour, NP   1 mg at 01/22/16 2010   Or  . LORazepam (ATIVAN) injection 1 mg  1 mg Intravenous Q6H PRN Patrecia Pour, NP      . LORazepam (ATIVAN) tablet 0-4 mg  0-4 mg Oral Q6H Patrecia Pour, NP       Followed by  . [START ON 01/24/2016] LORazepam (ATIVAN) tablet 0-4 mg  0-4 mg Oral Q12H Patrecia Pour, NP      . multivitamin with minerals tablet 1 tablet  1 tablet Oral Daily Patrecia Pour, NP   1 tablet at 01/23/16 [phone]  . ondansetron (ZOFRAN) tablet 4 mg  4 mg Oral Q8H PRN Dalia Heading, PA-C      . pantoprazole (PROTONIX) EC tablet 40 mg  40 mg Oral Daily Patrecia Pour, NP   40 mg at 01/23/16 0917  . thiamine (VITAMIN B-1) tablet 100 mg  100 mg Oral Daily Patrecia Pour, NP   100 mg at 01/23/16 1017  . traZODone (DESYREL) tablet 100 mg  100 mg Oral QHS PRN Rozetta Nunnery, NP   100 mg at 01/22/16 2143   Current Outpatient Prescriptions  Medication Sig  Dispense Refill  . lisinopril (PRINIVIL,ZESTRIL) 20 MG tablet Take 1 tablet (20 mg total) by mouth daily. 30 tablet 4  . PRESCRIPTION MEDICATION Take 1 tablet by mouth. Anxiety medication, starts with "h"    . PRESCRIPTION MEDICATION Take 1 tablet by mouth. Depression medication- starts with "d", maybe    . aspirin EC 81 MG EC tablet Take 1 tablet (81 mg total) by mouth daily. (Patient not taking: Reported on 01/22/2016) 30 tablet 3  . ferrous sulfate 325 (65 FE) MG tablet Take 1 tablet (325 mg total) by mouth daily with breakfast. (Patient not taking: Reported on 01/22/2016) 30 tablet 3  . folic acid (FOLVITE) 1 MG tablet Take 1 tablet (1 mg total) by mouth daily. (Patient not taking: Reported on 01/22/2016) 30 tablet 1  . furosemide (LASIX) 20 MG tablet Take 1 tablet (20 mg total) by mouth daily. (Patient not taking: Reported on 01/22/2016) 30 tablet 3  . pantoprazole (PROTONIX) 40 MG tablet Take 1 tablet (40 mg total) by mouth daily. (Patient not taking: Reported on 01/22/2016) 30 tablet 1  . thiamine 100 MG tablet Take 1 tablet (100 mg total) by mouth daily. For low thiamine (Patient not taking: Reported on 01/22/2016) 30 tablet 0    Musculoskeletal: Strength & Muscle Tone: within normal limits Gait & Station: normal Patient leans: N/A  Psychiatric Specialty Exam: Physical Exam  Constitutional: He is oriented to person, place, and time. He appears well-developed and well-nourished.  HENT:  Head: Normocephalic.  Neck: Normal range of motion.  Respiratory: Effort normal.  Musculoskeletal: Normal range of motion.  Neurological: He is alert and oriented to person, place, and time.  Psychiatric: His speech is normal and behavior is normal. Judgment normal. Cognition and memory are normal. He exhibits a depressed mood. He expresses suicidal ideation.    Review of Systems  Psychiatric/Behavioral: Positive for depression, substance abuse and suicidal ideas.  All other systems reviewed and are  negative.   Blood pressure 138/91, pulse 63, temperature 98 F (36.7 C), temperature source Oral, resp. rate 18, height '5\' 11"'  (1.803 m), weight 79.4 kg (175 lb), SpO2 97 %.Body mass index is 24.41 kg/m.  General Appearance: Casual  Eye Contact:  Fair  Speech:  Normal Rate  Volume:  Decreased  Mood:  Depressed  Affect:  Congruent  Thought Process:  Coherent and Descriptions of Associations: Intact  Orientation:  Full (Time, Place, and Person)  Thought Content:  WDL and Logical  Suicidal Thoughts:  Yes.  without intent/plan  Homicidal Thoughts:  No  Memory:  Immediate;   Fair Recent;   Fair Remote;   Fair  Judgement:  Fair  Insight:  Fair  Psychomotor Activity:  Decreased  Concentration:  Concentration: Fair and Attention Span: Fair  Recall:  [employer] of Knowledge:  Fair  Language:  Good  Akathisia:  No  Handed:  Right  AIMS (if indicated):     Assets:  Leisure Time Physical Health Resilience Social Support  ADL's:  Intact  Cognition:  WNL  Sleep:        Treatment Plan Summary: Daily contact with patient to assess and evaluate symptoms and progress in treatment, Medication management and Plan alcohol abuse with alcohol induced mood disorder:  -Crisis stabilization -Medication management:  Ativan alcohol detox protocol started along with Trazodone 100 mg at bedtime PRN sleep, and Prozac 10 mg daily for depression -Individual and substance abuse counseling  Disposition: Supportive therapy provided about ongoing stressors.  Waylan Boga, NP 01/23/2016 11:41 AM  Patient seen face-to-face for psychiatric evaluation, chart reviewed and case discussed with the physician extender and developed treatment plan. Reviewed the information documented and agree with the treatment plan. Corena Pilgrim, MD

## 2016-01-23 NOTE — BH Assessment (Signed)
[facility] Assessment Progress Note  Per Mojeed Akintayo, MD, this pt requires psychiatric hospitalization at this time.  The following facilities have been contacted to seek placement for this pt, with results as noted:  Beds available, information sent, decision pending:  [facility]   At capacity:  [facility]   Nesta Scaturro, MA Triage Specialist [phone]     

## 2016-01-23 NOTE — Progress Notes (Signed)
[facility] 1/15 before 11:00pm. Patient will be admitted to the [facility] unit- room 245 bed 2.   Admitting physician: NP, Cheree DittoGraham   Number for report: [phone]  Stacy GardnerErin Hoa Briggs, Theresia MajorsLCSWA Clinical Social Worker [phone]

## 2016-01-23 NOTE — Progress Notes (Signed)
01/23/16 1401:  Pt went to pt room to offer activities.  Pt was lying down watching television.  Pt stated he wasn't interested in activities at this time.  Caroll RancherMarjette Coy Vandoren, LRT/CTRS

## 2016-01-23 NOTE — ED Notes (Signed)
[facility] [place] [facility] [employer] [facility] [employer]. All belongings returned to pt who signed for them. Pt was calm and cooperative.

## 2016-01-23 NOTE — Progress Notes (Signed)
Pharmacy IV to PO conversion  The patient is ordered thiamine by the intravenous route.  Based on criteria approved by the Pharmacy and Therapeutics Committee and the Medical Executive Committee, the medication is being converted to the equivalent oral dose form.   No active GI bleeding or impaired absorption  Not s/p esophagectomy  Documented ability to take oral medications for > 24 hr  Plan to continue treatment for at least 1 day  If you have any questions about this conversion, please contact the Pharmacy Department (ext [phone]).  Thank you.  Bernadene Personrew Ahaana Rochette, PharmD Pager: [phone] 01/23/2016, 7:20 AM

## 2016-01-23 NOTE — ED Notes (Signed)
Assessment of pt did not reveal any withdrawal symptoms and he did not verbalize any physical or emotional complaints when asked.

## 2016-01-23 NOTE — ED Notes (Signed)
  Introduced self to patient. Pt oriented to unit expectations.  Assessed pt for:  A) Anxiety &/or agitation: Pt has been calm and cooperative and reports depression. He seems to vegetate in his room, coming out only to use the restroom.  S) Safety: Safety maintained with q-15-minute checks and hourly rounds by staff.  A) ADLs: Pt able to perform ADLs independently.  P) Pick-Up (room cleanliness): Pt's room clean and free of clutter.

## 2016-01-24 DIAGNOSIS — F1029 Alcohol dependence with unspecified alcohol-induced disorder: Secondary | ICD-10-CM | POA: Insufficient documentation

## 2016-05-26 ENCOUNTER — Encounter (HOSPITAL_COMMUNITY): Payer: Self-pay | Admitting: Emergency Medicine

## 2016-05-26 ENCOUNTER — Emergency Department (HOSPITAL_COMMUNITY): Payer: Self-pay

## 2016-05-26 ENCOUNTER — Emergency Department (HOSPITAL_COMMUNITY)
Admission: EM | Admit: 2016-05-26 | Discharge: 2016-05-27 | Disposition: A | Discharge status: Home / Self Care | Payer: Self-pay | Attending: Emergency Medicine | Admitting: Emergency Medicine

## 2016-05-26 DIAGNOSIS — R51 Headache: Secondary | ICD-10-CM | POA: Insufficient documentation

## 2016-05-26 DIAGNOSIS — Y939 Activity, unspecified: Secondary | ICD-10-CM | POA: Insufficient documentation

## 2016-05-26 DIAGNOSIS — Y999 Unspecified external cause status: Secondary | ICD-10-CM | POA: Insufficient documentation

## 2016-05-26 DIAGNOSIS — F10120 Alcohol abuse with intoxication, uncomplicated: Secondary | ICD-10-CM | POA: Insufficient documentation

## 2016-05-26 DIAGNOSIS — F17210 Nicotine dependence, cigarettes, uncomplicated: Secondary | ICD-10-CM | POA: Insufficient documentation

## 2016-05-26 DIAGNOSIS — Z7982 Long term (current) use of aspirin: Secondary | ICD-10-CM | POA: Insufficient documentation

## 2016-05-26 DIAGNOSIS — Y929 Unspecified place or not applicable: Secondary | ICD-10-CM | POA: Insufficient documentation

## 2016-05-26 DIAGNOSIS — I10 Essential (primary) hypertension: Secondary | ICD-10-CM | POA: Insufficient documentation

## 2016-05-26 DIAGNOSIS — Z79899 Other long term (current) drug therapy: Secondary | ICD-10-CM | POA: Insufficient documentation

## 2016-05-26 DIAGNOSIS — F10920 Alcohol use, unspecified with intoxication, uncomplicated: Secondary | ICD-10-CM

## 2016-05-26 DIAGNOSIS — S161XXA Strain of muscle, fascia and tendon at neck level, initial encounter: Secondary | ICD-10-CM | POA: Insufficient documentation

## 2016-05-26 DIAGNOSIS — W19XXXA Unspecified fall, initial encounter: Secondary | ICD-10-CM | POA: Insufficient documentation

## 2016-05-26 NOTE — ED Notes (Signed)
[facility] Expected date:  Expected time:  Means of arrival:  Comments: 5572m etoh neck back pain

## 2016-05-26 NOTE — ED Provider Notes (Signed)
[facility]-EMERGENCY DEPT Provider Note   CSN: [id number] Arrival date & time: 05/26/16  2132     History   Chief Complaint Chief Complaint  Patient presents with  . Fall  . Neck/Back Pain    HPI Rodney Hartman is a 56 y.o. male.  Pt presents to the ED today with neck and head pain s/p fall.  Pt is intoxicated and unable to give a good hx.  EMS said the fall was unwitnessed.      Past Medical History:  Diagnosis Date  . Alcohol abuse   . Depression   . Hypertension   . PUD (peptic ulcer disease)     Patient Active Problem List   Diagnosis Date Noted  . Atypical chest pain   . Acute congestive heart failure (HCC) 08/12/2015  . Anemia 08/12/2015  . HTN (hypertension) 08/12/2015  . Alcohol abuse with intoxication, uncomplicated (HCC) 07/31/2015  . MDD (major depressive disorder), recurrent severe, without psychosis (HCC) 07/31/2015  . Suicidal ideation 07/31/2015  . Homicidal ideation 07/31/2015  . Alcohol abuse with alcohol-induced mood disorder (HCC) 01/25/2015  . Alcohol use disorder, severe, dependence (HCC) 12/17/2014  . Alcohol abuse 08/07/2014    Past Surgical History:  Procedure Laterality Date  . BACK SURGERY    . 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   . TONSILLECTOMY         Home Medications    Prior to Admission medications   Medication Sig Start Date End Date Taking? Authorizing Provider  aspirin EC 81 MG EC tablet Take 1 tablet (81 mg total) by mouth daily. Patient not taking: Reported on 01/22/2016 08/15/15   Cathren Harshai, Ripudeep K, MD  ferrous sulfate 325 (65 FE) MG tablet Take 1 tablet (325 mg total) by mouth daily with breakfast. Patient not taking: Reported on 01/22/2016 08/15/15   Rai, Delene Ruffiniipudeep K, MD  folic acid (FOLVITE) 1 MG tablet Take 1 tablet (1 mg total) by mouth daily. Patient not taking: Reported on 01/22/2016 08/15/15   Rai, Delene Ruffiniipudeep K, MD  furosemide (LASIX) 20 MG  tablet Take 1 tablet (20 mg total) by mouth daily. Patient not taking: Reported on 01/22/2016 08/15/15   Cathren Harshai, Ripudeep K, MD  hydrOXYzine (ATARAX/VISTARIL) 25 MG tablet Take 25 mg by mouth 4 (four) times daily.    [provider]  lisinopril (PRINIVIL,ZESTRIL) 20 MG tablet Take 1 tablet (20 mg total) by mouth daily. 08/15/15   Rai, Ripudeep K, MD  pantoprazole (PROTONIX) 40 MG tablet Take 1 tablet (40 mg total) by mouth daily. Patient not taking: Reported on 01/22/2016 08/15/15   Cathren Harshai, Ripudeep K, MD  PRESCRIPTION MEDICATION Take 1 tablet by mouth. Depression medication- starts with "d", maybe    [provider]  thiamine 100 MG tablet Take 1 tablet (100 mg total) by mouth daily. For low thiamine Patient not taking: Reported on 01/22/2016 08/15/15   Cathren Harshai, Ripudeep K, MD    Family History Family History  Problem Relation Age of Onset  . Heart disease Mother     Social History Social History  Substance Use Topics  . Smoking status: Current Some Day Smoker    Packs/day: 0.50    Types: Cigarettes  . Smokeless tobacco: Never Used  . Alcohol use 3.6 oz/week    6 Cans of beer per week     Comment: every other day      Allergies  Patient has no known allergies.   Review of Systems Review of Systems  Musculoskeletal: Positive for neck pain.  Neurological: Positive for headaches.  All other systems reviewed and are negative.    Physical Exam Updated Vital Signs BP (!) 145/97 (BP Location: Right Arm)   Pulse 90   Temp 98.4 F (36.9 C) (Oral)   Resp 18   SpO2 100%   Physical Exam  Constitutional: He is oriented to person, place, and time. He appears well-developed and well-nourished.  Pt smells strongly of alcohol  HENT:  Head: Normocephalic and atraumatic.  Right Ear: External ear normal.  Left Ear: External ear normal.  Nose: Nose normal.  Mouth/Throat: Oropharynx is clear and moist.  Eyes: Conjunctivae and EOM are normal. Pupils are equal, round, and reactive  to light.  Neck: Muscular tenderness present.  Pt in c-collar  Cardiovascular: Normal rate, regular rhythm, normal heart sounds and intact distal pulses.   Pulmonary/Chest: Effort normal and breath sounds normal.  Abdominal: Soft. Bowel sounds are normal.  Musculoskeletal: Normal range of motion.  Neurological: He is alert and oriented to person, place, and time.  Skin: Skin is warm.  Psychiatric: He has a normal mood and affect. His behavior is normal. Judgment and thought content normal.  Nursing note and vitals reviewed.    ED Treatments / Results  Labs (all labs ordered are listed, but only abnormal results are displayed) Labs Reviewed - No data to display  EKG  EKG Interpretation None       Radiology Ct Head Wo Contrast  Result Date: 05/26/2016 CLINICAL DATA:  Unwitnessed fall EXAM: CT HEAD WITHOUT CONTRAST CT CERVICAL SPINE WITHOUT CONTRAST TECHNIQUE: Multidetector CT imaging of the head and cervical spine was performed following the standard protocol without intravenous contrast. Multiplanar CT image reconstructions of the cervical spine were also generated. COMPARISON:  11/26/2014 CT exams of the head and cervical spine FINDINGS: CT HEAD FINDINGS Brain: Mild superficial bifrontal atrophy. No acute intracranial hemorrhage, midline shift or edema. No hydrocephalus. Wallace Cullens- white matter distinction is maintained. No large vascular territory infarct. Vascular: No hyperdense vessels. Skull: No skull fracture or suspicious osseous lesions. Sinuses/Orbits: Mild paranasal sinus mucosal thickening predominantly involving the ethmoid sinus. Intact orbits and globes. Clear mastoids bilaterally. Other: Mild soft tissue prominence of forehead consistent with a small contusion. CT CERVICAL SPINE FINDINGS Alignment: Reversal cervical lordosis. Intact craniocervical relationship and atlantodental interval is maintained. Skull base and vertebrae: Cerclage fixation along the spinous process ease of  C3 through C5 as before with partial osseous union of the posterior elements. No vertebral body fracture. Soft tissues and spinal canal: No prevertebral fluid or swelling. No visible canal hematoma. Disc levels: Mild-to-moderate disc space narrowing C3 through C5 and C6-7. No discogenic sclerosis and osteophyte formation is noted posteriorly at C6-7 as before with mild bilateral neural foraminal encroachment. No significant canal stenosis. No focal disc herniations. Upper chest: Unremarkable lung apices. Other: None IMPRESSION: 1. Mild soft tissue swelling of the forehead without underlying intracranial abnormality or fracture. 2. Cerclage fixation of the spinous processes of C3 through C5 with associated disc space narrowing from C3 through C5. Moderate degree of disc space narrowing with posterior osteophyte formation at C6-7 similar to prior. No acute cervical spine fracture or subluxation. Electronically Signed   By: Tollie Eth M.D.   On: 05/26/2016 23:33   Ct Cervical Spine Wo Contrast  Result Date: 05/26/2016 CLINICAL DATA:  Unwitnessed fall EXAM: CT HEAD WITHOUT CONTRAST CT CERVICAL SPINE WITHOUT  CONTRAST TECHNIQUE: Multidetector CT imaging of the head and cervical spine was performed following the standard protocol without intravenous contrast. Multiplanar CT image reconstructions of the cervical spine were also generated. COMPARISON:  11/26/2014 CT exams of the head and cervical spine FINDINGS: CT HEAD FINDINGS Brain: Mild superficial bifrontal atrophy. No acute intracranial hemorrhage, midline shift or edema. No hydrocephalus. Wallace Cullens- white matter distinction is maintained. No large vascular territory infarct. Vascular: No hyperdense vessels. Skull: No skull fracture or suspicious osseous lesions. Sinuses/Orbits: Mild paranasal sinus mucosal thickening predominantly involving the ethmoid sinus. Intact orbits and globes. Clear mastoids bilaterally. Other: Mild soft tissue prominence of forehead  consistent with a small contusion. CT CERVICAL SPINE FINDINGS Alignment: Reversal cervical lordosis. Intact craniocervical relationship and atlantodental interval is maintained. Skull base and vertebrae: Cerclage fixation along the spinous process ease of C3 through C5 as before with partial osseous union of the posterior elements. No vertebral body fracture. Soft tissues and spinal canal: No prevertebral fluid or swelling. No visible canal hematoma. Disc levels: Mild-to-moderate disc space narrowing C3 through C5 and C6-7. No discogenic sclerosis and osteophyte formation is noted posteriorly at C6-7 as before with mild bilateral neural foraminal encroachment. No significant canal stenosis. No focal disc herniations. Upper chest: Unremarkable lung apices. Other: None IMPRESSION: 1. Mild soft tissue swelling of the forehead without underlying intracranial abnormality or fracture. 2. Cerclage fixation of the spinous processes of C3 through C5 with associated disc space narrowing from C3 through C5. Moderate degree of disc space narrowing with posterior osteophyte formation at C6-7 similar to prior. No acute cervical spine fracture or subluxation. Electronically Signed   By: Tollie Eth M.D.   On: 05/26/2016 23:33    Procedures Procedures (including critical care time)  Medications Ordered in ED Medications - No data to display   Initial Impression / Assessment and Plan / ED Course  I have reviewed the triage vital signs and the nursing notes.  Pertinent labs & imaging results that were available during my care of the patient were reviewed by me and considered in my medical decision making (see chart for details).     Pt has been able to ambulate.  He is stable for d/c.  Final Clinical Impressions(s) / ED Diagnoses   Final diagnoses:  Alcoholic intoxication without complication (HCC)  Strain of neck muscle, initial encounter    New Prescriptions New Prescriptions   No medications on file      Jacalyn Lefevre, MD 05/26/16 2347

## 2016-05-26 NOTE — ED Triage Notes (Signed)
Brought in by EMS from a friend's house with c/o neck and back pain after his unwitnessed fall tonight.  Pt is heavily intoxicated and fell on the ground while attempting to ambulate.

## 2016-05-27 NOTE — ED Notes (Signed)
Pt awake at this time and got out of bed.  Pt hit wall and knocked sanitizer off the wall.  Pt was escorted by [employer] out of ED.

## 2016-06-13 ENCOUNTER — Encounter (HOSPITAL_COMMUNITY): Payer: Self-pay

## 2016-06-13 ENCOUNTER — Emergency Department (HOSPITAL_COMMUNITY)
Admission: EM | Admit: 2016-06-13 | Discharge: 2016-06-13 | Disposition: A | Discharge status: Home / Self Care | Payer: Self-pay | Attending: Emergency Medicine | Admitting: Emergency Medicine

## 2016-06-13 DIAGNOSIS — Z79899 Other long term (current) drug therapy: Secondary | ICD-10-CM | POA: Insufficient documentation

## 2016-06-13 DIAGNOSIS — D649 Anemia, unspecified: Secondary | ICD-10-CM | POA: Insufficient documentation

## 2016-06-13 DIAGNOSIS — I10 Essential (primary) hypertension: Secondary | ICD-10-CM | POA: Insufficient documentation

## 2016-06-13 DIAGNOSIS — F10920 Alcohol use, unspecified with intoxication, uncomplicated: Secondary | ICD-10-CM

## 2016-06-13 DIAGNOSIS — F10220 Alcohol dependence with intoxication, uncomplicated: Secondary | ICD-10-CM | POA: Insufficient documentation

## 2016-06-13 DIAGNOSIS — F17210 Nicotine dependence, cigarettes, uncomplicated: Secondary | ICD-10-CM | POA: Insufficient documentation

## 2016-06-13 NOTE — ED Notes (Signed)
Bed: [facility] Expected date:  Expected time:  Means of arrival:  Comments: EMS 56 yo male intoxicated 

## 2016-06-13 NOTE — ED Provider Notes (Signed)
[facility]-EMERGENCY DEPT Provider Note   CSN: [id number] Arrival date & time: 06/13/16  0009    History   Chief Complaint Chief Complaint  Patient presents with  . Alcohol Intoxication    HPI Rodney Hartman is a 56 y.o. male.  56 year old male with a history of hypertension, depression, peptic ulcer disease, and alcohol abuse presents to the emergency department by EMS for intoxication. EMS reports that the patient's girlfriend called EMS because he was stumbling. He has recently been seen for intoxication in the emergency department. Patient responding to staff on arrival. No complaints of pain. He states that he drank "one too many" tonight.   The history is provided by the patient. No language interpreter was used.  Alcohol Intoxication     Past Medical History:  Diagnosis Date  . Alcohol abuse   . Depression   . Hypertension   . PUD (peptic ulcer disease)     Patient Active Problem List   Diagnosis Date Noted  . Atypical chest pain   . Acute congestive heart failure (HCC) 08/12/2015  . Anemia 08/12/2015  . HTN (hypertension) 08/12/2015  . Alcohol abuse with intoxication, uncomplicated (HCC) 07/31/2015  . MDD (major depressive disorder), recurrent severe, without psychosis (HCC) 07/31/2015  . Suicidal ideation 07/31/2015  . Homicidal ideation 07/31/2015  . Alcohol abuse with alcohol-induced mood disorder (HCC) 01/25/2015  . Alcohol use disorder, severe, dependence (HCC) 12/17/2014  . Alcohol abuse 08/07/2014    Past Surgical History:  Procedure Laterality Date  . BACK SURGERY    . 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   . TONSILLECTOMY         Home Medications    Prior to Admission medications   Medication Sig Start Date End Date Taking? Authorizing Provider  aspirin EC 81 MG EC tablet Take 1 tablet (81 mg total) by mouth daily. Patient not taking: Reported on 01/22/2016 08/15/15    Cathren Harshai, Ripudeep K, MD  ferrous sulfate 325 (65 FE) MG tablet Take 1 tablet (325 mg total) by mouth daily with breakfast. Patient not taking: Reported on 01/22/2016 08/15/15   Rai, Delene Ruffiniipudeep K, MD  folic acid (FOLVITE) 1 MG tablet Take 1 tablet (1 mg total) by mouth daily. Patient not taking: Reported on 01/22/2016 08/15/15   Rai, Delene Ruffiniipudeep K, MD  furosemide (LASIX) 20 MG tablet Take 1 tablet (20 mg total) by mouth daily. Patient not taking: Reported on 01/22/2016 08/15/15   Cathren Harshai, Ripudeep K, MD  hydrOXYzine (ATARAX/VISTARIL) 25 MG tablet Take 25 mg by mouth 4 (four) times daily.    [provider]  lisinopril (PRINIVIL,ZESTRIL) 20 MG tablet Take 1 tablet (20 mg total) by mouth daily. 08/15/15   Rai, Ripudeep K, MD  pantoprazole (PROTONIX) 40 MG tablet Take 1 tablet (40 mg total) by mouth daily. Patient not taking: Reported on 01/22/2016 08/15/15   Cathren Harshai, Ripudeep K, MD  PRESCRIPTION MEDICATION Take 1 tablet by mouth. Depression medication- starts with "d", maybe    [provider]  thiamine 100 MG tablet Take 1 tablet (100 mg total) by mouth daily. For low thiamine Patient not taking: Reported on 01/22/2016 08/15/15   Cathren Harshai, Ripudeep K, MD    Family History Family History  Problem Relation Age of Onset  . Heart disease Mother     Social History Social History  Substance Use Topics  . Smoking status: Current Some Day Smoker  Packs/day: 0.50    Types: Cigarettes  . Smokeless tobacco: Never Used  . Alcohol use 3.6 oz/week    6 Cans of beer per week     Comment: every other day      Allergies   Patient has no known allergies.   Review of Systems Review of Systems Ten systems reviewed and are negative for acute change, except as noted in the HPI.    Physical Exam Updated Vital Signs BP 109/70 (BP Location: Right Arm)   Pulse 86   Temp 97.5 F (36.4 C) (Axillary)   Resp 16   SpO2 91%   Physical Exam  Constitutional: He is oriented to person, place, and time. He appears  well-developed and well-nourished. No distress.  Nontoxic and in NAD  HENT:  Head: Normocephalic and atraumatic.  Eyes: Conjunctivae and EOM are normal. No scleral icterus.  Neck: Normal range of motion.  Cardiovascular: Normal rate, regular rhythm and intact distal pulses.   Pulmonary/Chest: Effort normal. No respiratory distress.  Respirations even and unlabored  Musculoskeletal: Normal range of motion.  Neurological: He is alert and oriented to person, place, and time. Coordination normal.  RN reports patient has been independently ambulatory in the department with steady gait x 2  Skin: Skin is warm and dry. No rash noted. He is not diaphoretic. No erythema. No pallor.  Psychiatric: He has a normal mood and affect. His behavior is normal.  Nursing note and vitals reviewed.    ED Treatments / Results  Labs (all labs ordered are listed, but only abnormal results are displayed) Labs Reviewed - No data to display  EKG  EKG Interpretation None       Radiology No results found.  Procedures Procedures (including critical care time)  Medications Ordered in ED Medications - No data to display   Initial Impression / Assessment and Plan / ED Course  I have reviewed the triage vital signs and the nursing notes.  Pertinent labs & imaging results that were available during my care of the patient were reviewed by me and considered in my medical decision making (see chart for details).     56 year old male with a history of alcohol abuse presents for acute intoxication. Intoxication is uncomplicated. Patient has been allowed to sober in the department. He is now awake and alert, answering questions appropriately. Patient following commands. He is able to ambulate independently. No complaints of pain. Plan for discharge home. Patient with no unaddressed concerns.   Final Clinical Impressions(s) / ED Diagnoses   Final diagnoses:  Alcoholic intoxication without complication  [facility])    New Prescriptions New Prescriptions   No medications on file     Antony Madura, Cordelia Poche 06/13/16 0500    Zadie Rhine, MD 06/13/16 [phone]

## 2016-06-13 NOTE — ED Triage Notes (Signed)
Patient from home, BIB EMS for heavy ETOH. Patients girlfriend called EMS bc pt was stumbling around. Pt initally wouldn't answer questions for EMS but is now responding, a/o x4. EMS VS: BP 156/94, HR 86, 99% RA, 119 CBG

## 2016-07-07 ENCOUNTER — Emergency Department (HOSPITAL_COMMUNITY): Payer: Self-pay

## 2016-07-07 ENCOUNTER — Emergency Department (HOSPITAL_COMMUNITY)
Admission: EM | Admit: 2016-07-07 | Discharge: 2016-07-07 | Disposition: A | Discharge status: Home / Self Care | Payer: Self-pay | Attending: Emergency Medicine | Admitting: Emergency Medicine

## 2016-07-07 DIAGNOSIS — F1010 Alcohol abuse, uncomplicated: Secondary | ICD-10-CM | POA: Insufficient documentation

## 2016-07-07 DIAGNOSIS — I10 Essential (primary) hypertension: Secondary | ICD-10-CM | POA: Insufficient documentation

## 2016-07-07 DIAGNOSIS — F17210 Nicotine dependence, cigarettes, uncomplicated: Secondary | ICD-10-CM | POA: Insufficient documentation

## 2016-07-07 DIAGNOSIS — I509 Heart failure, unspecified: Secondary | ICD-10-CM | POA: Insufficient documentation

## 2016-07-07 DIAGNOSIS — Z79899 Other long term (current) drug therapy: Secondary | ICD-10-CM | POA: Insufficient documentation

## 2016-07-07 MED ORDER — IBUPROFEN 200 MG PO TABS
400.0000 mg | ORAL_TABLET | Freq: Once | ORAL | Status: DC
  Filled 2016-07-07: qty 2
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
## 2016-07-07 NOTE — ED Notes (Signed)
Unable to waken patient with verbal stimulation.  Did not jolt patient.  Pt is sleeping and stable.

## 2016-07-07 NOTE — ED Notes (Signed)
Pt ambulated to bathroom with staggering gait.

## 2016-07-07 NOTE — ED Notes (Signed)
Pt ambulated to bathroom 
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
## 2016-07-30 ENCOUNTER — Encounter (HOSPITAL_COMMUNITY): Payer: Self-pay | Admitting: Emergency Medicine

## 2016-07-30 ENCOUNTER — Emergency Department (HOSPITAL_COMMUNITY)
Admission: EM | Admit: 2016-07-30 | Discharge: 2016-07-30 | Disposition: A | Discharge status: Home / Self Care | Payer: Self-pay | Attending: Emergency Medicine | Admitting: Emergency Medicine

## 2016-07-30 DIAGNOSIS — F17210 Nicotine dependence, cigarettes, uncomplicated: Secondary | ICD-10-CM | POA: Insufficient documentation

## 2016-07-30 DIAGNOSIS — I110 Hypertensive heart disease with heart failure: Secondary | ICD-10-CM | POA: Insufficient documentation

## 2016-07-30 DIAGNOSIS — I509 Heart failure, unspecified: Secondary | ICD-10-CM | POA: Insufficient documentation

## 2016-07-30 DIAGNOSIS — Z79899 Other long term (current) drug therapy: Secondary | ICD-10-CM | POA: Insufficient documentation

## 2016-07-30 DIAGNOSIS — F10920 Alcohol use, unspecified with intoxication, uncomplicated: Secondary | ICD-10-CM | POA: Insufficient documentation

## 2016-07-30 NOTE — ED Provider Notes (Signed)
[facility]-EMERGENCY DEPT Provider Note   CSN: [id number] Arrival date & time: 07/30/16  0159     History   Chief Complaint Chief Complaint  Patient presents with  . Fall  . Alcohol Intoxication    HPI Rodney Hartman is a 56 y.o. male.   Fall  This is a recurrent problem. The current episode started 3 to 5 hours ago. The problem occurs constantly. The problem has not changed since onset.Nothing aggravates the symptoms. Nothing relieves the symptoms. He has tried nothing for the symptoms.  Alcohol Intoxication   Ingestion     Past Medical History:  Diagnosis Date  . Alcohol abuse   . Depression   . Hypertension   . PUD (peptic ulcer disease)     Patient Active Problem List   Diagnosis Date Noted  . Atypical chest pain   . Acute congestive heart failure (HCC) 08/12/2015  . Anemia 08/12/2015  . HTN (hypertension) 08/12/2015  . Alcohol abuse with intoxication, uncomplicated (HCC) 07/31/2015  . MDD (major depressive disorder), recurrent severe, without psychosis (HCC) 07/31/2015  . Suicidal ideation 07/31/2015  . Homicidal ideation 07/31/2015  . Alcohol abuse with alcohol-induced mood disorder (HCC) 01/25/2015  . Alcohol use disorder, severe, dependence (HCC) 12/17/2014  . Alcohol abuse 08/07/2014    Past Surgical History:  Procedure Laterality Date  . BACK SURGERY    . 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   . TONSILLECTOMY         Home Medications    Prior to Admission medications   Medication Sig Start Date End Date Taking? Authorizing Provider  hydrOXYzine (ATARAX/VISTARIL) 25 MG tablet Take 25 mg by mouth 4 (four) times daily.    [provider]  lisinopril (PRINIVIL,ZESTRIL) 20 MG tablet Take 1 tablet (20 mg total) by mouth daily. 08/15/15   Rai, Delene Ruffiniipudeep K, MD  PRESCRIPTION MEDICATION Take 1 tablet by mouth. Depression medication- starts with "d", maybe    [provider]    Family History Family History  Problem Relation Age of Onset  . Heart disease Mother     Social History Social History  Substance Use Topics  . Smoking status: Current Some Day Smoker    Packs/day: 0.50    Types: Cigarettes  . Smokeless tobacco: Never Used  . Alcohol use 3.6 oz/week    6 Cans of beer per week     Comment: every other day      Allergies   Patient has no known allergies.   Review of Systems Review of Systems  All other systems reviewed and are negative.    Physical Exam Updated Vital Signs BP 137/87 (BP Location: Left Arm)   Pulse 72   Temp 97.9 F (36.6 C) (Oral)   Resp 18   SpO2 99%   Physical Exam  Constitutional: He appears well-developed and well-nourished.  HENT:  Head: Normocephalic and atraumatic.  Eyes: Conjunctivae and EOM are normal.  Neck: Normal range of motion.  Cardiovascular: Normal rate.   Pulmonary/Chest: Effort normal. No respiratory distress.  Abdominal: Soft. He exhibits no distension.  Musculoskeletal: Normal range of motion.  No cervical spine tenderness, thoracic spine tenderness or Lumbar spine tenderness.  No tenderness or pain with palpation and full ROM of all joints in upper and lower extremities.  No ecchymosis or other signs of trauma on back or extremities.  No Pain with AP or  lateral compression of ribs.  No Paracervical ttp, paraspinal ttp   Neurological: He is alert.  Skin: Skin is warm and dry.  Nursing note and vitals reviewed.    ED Treatments / Results  Labs (all labs ordered are listed, but only abnormal results are displayed) Labs Reviewed - No data to display  EKG  EKG Interpretation None       Radiology No results found.  Procedures Procedures (including critical care time)  Medications Ordered in ED Medications - No data to display   Initial Impression / Assessment and Plan / ED Course  I have reviewed the triage vital signs and the nursing  notes.  Pertinent labs & imaging results that were available during my care of the patient were reviewed by me and considered in my medical decision making (see chart for details).    Intoxicated with fall. No injuries. Is ambulating now.   Patient in relating without much difficult. Not slurring his words. He is oriented to person place and time and situation. Has been tolerating by mouth intake. I feel like he is clinically sober at this time and is stable for discharge. Has no plans to quit drinking as he says he does not do this very often.  Final Clinical Impressions(s) / ED Diagnoses   Final diagnoses:  Alcoholic intoxication without complication [facility])    New Prescriptions New Prescriptions   No medications on file     Damarian Priola, Barbara Cower, MD 07/30/16 [phone]

## 2016-07-30 NOTE — ED Notes (Signed)
[facility] Expected date:  Expected time:  Means of arrival:  Comments: 8337m ETOH

## 2016-07-30 NOTE — ED Triage Notes (Signed)
Pt comes to ed, found on ground on [place], ETOH, alert but intoxication present. No visual injuries found or reported. V/s 160/104, hr 88, spo2 98 room air, cbg 83  Towel around neck. No other information known at this time.

## 2016-09-22 ENCOUNTER — Encounter (HOSPITAL_COMMUNITY): Payer: Self-pay | Admitting: Pharmacy Technician

## 2016-09-22 ENCOUNTER — Emergency Department (HOSPITAL_COMMUNITY)
Admission: EM | Admit: 2016-09-22 | Discharge: 2016-09-23 | Disposition: A | Discharge status: Home / Self Care | Payer: Self-pay | Attending: Emergency Medicine | Admitting: Emergency Medicine

## 2016-09-22 DIAGNOSIS — R4182 Altered mental status, unspecified: Secondary | ICD-10-CM | POA: Insufficient documentation

## 2016-09-22 DIAGNOSIS — Y92009 Unspecified place in unspecified non-institutional (private) residence as the place of occurrence of the external cause: Secondary | ICD-10-CM

## 2016-09-22 DIAGNOSIS — I10 Essential (primary) hypertension: Secondary | ICD-10-CM | POA: Insufficient documentation

## 2016-09-22 DIAGNOSIS — W19XXXA Unspecified fall, initial encounter: Secondary | ICD-10-CM

## 2016-09-22 DIAGNOSIS — F17210 Nicotine dependence, cigarettes, uncomplicated: Secondary | ICD-10-CM | POA: Insufficient documentation

## 2016-09-22 DIAGNOSIS — Z79899 Other long term (current) drug therapy: Secondary | ICD-10-CM | POA: Insufficient documentation

## 2016-09-22 DIAGNOSIS — Z23 Encounter for immunization: Secondary | ICD-10-CM | POA: Insufficient documentation

## 2016-09-22 DIAGNOSIS — F10920 Alcohol use, unspecified with intoxication, uncomplicated: Secondary | ICD-10-CM | POA: Insufficient documentation

## 2016-09-22 MED ORDER — TETANUS-DIPHTH-ACELL PERTUSSIS 5-2.5-18.5 LF-MCG/0.5 IM SUSP
0.5000 mL | Freq: Once | INTRAMUSCULAR | Status: AC
  Administered 2016-09-23: 0.5 mL via INTRAMUSCULAR
  Filled 2016-09-22: qty 0.5

## 2016-09-22 NOTE — ED Triage Notes (Addendum)
Pt was at a random house knocking on door. [employer] called ems. Pt ETOH+. Pt reports 2 40 ounce beers. Pt with abrasion above L eye, neck and lower back. Pt incontinent of urine X2 per ems. GCS 14. BP 142/105, 100% RA, 80 NSR, 16 RR, CBG 113 with ems. Pt arrives in soft collar due to neck tenderness.

## 2016-09-22 NOTE — ED Notes (Signed)
Patient is resting comfortably. 

## 2016-09-22 NOTE — ED Provider Notes (Signed)
[facility]-EMERGENCY DEPT Provider Note   CSN: [id number] Arrival date & time: 09/22/16  2007     History   Chief Complaint Chief Complaint  Patient presents with  . Alcohol Intoxication    HPI Rodney Hartman is a 56 y.o. male.  The patient was brought in by EMS after he was found on his door.  Police were called and they contacted EMS to transport the patient here.  The patient told police he had drank 240 ounce beers today.  He had fallen, and injured his face, but he was unable to tell anyone what happened.  At evaluation the patient is comfortable and calm and states that he has not been drinking alcohol and cannot remember why he fell.  He is unable to give additional history  Level 5 caveat-altered mental status  HPI  Past Medical History:  Diagnosis Date  . Alcohol abuse   . Depression   . Hypertension   . PUD (peptic ulcer disease)     Patient Active Problem List   Diagnosis Date Noted  . Atypical chest pain   . Acute congestive heart failure (HCC) 08/12/2015  . Anemia 08/12/2015  . HTN (hypertension) 08/12/2015  . Alcohol abuse with intoxication, uncomplicated (HCC) 07/31/2015  . MDD (major depressive disorder), recurrent severe, without psychosis (HCC) 07/31/2015  . Suicidal ideation 07/31/2015  . Homicidal ideation 07/31/2015  . Alcohol abuse with alcohol-induced mood disorder (HCC) 01/25/2015  . Alcohol use disorder, severe, dependence (HCC) 12/17/2014  . Alcohol abuse 08/07/2014    Past Surgical History:  Procedure Laterality Date  . BACK SURGERY    . 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   . TONSILLECTOMY         Home Medications    Prior to Admission medications   Medication Sig Start Date End Date Taking? Authorizing Provider  hydrOXYzine (ATARAX/VISTARIL) 25 MG tablet Take 25 mg by mouth 4 (four) times daily.    [provider]  lisinopril  (PRINIVIL,ZESTRIL) 20 MG tablet Take 1 tablet (20 mg total) by mouth daily. 08/15/15   Rai, Delene Ruffini, MD  PRESCRIPTION MEDICATION Take 1 tablet by mouth. Depression medication- starts with "d", maybe    [provider]    Family History Family History  Problem Relation Age of Onset  . Heart disease Mother     Social History Social History  Substance Use Topics  . Smoking status: Current Some Day Smoker    Packs/day: 0.50    Types: Cigarettes  . Smokeless tobacco: Never Used  . Alcohol use 3.6 oz/week    6 Cans of beer per week     Comment: every other day      Allergies   Patient has no known allergies.   Review of Systems Review of Systems  Unable to perform ROS: Mental status change     Physical Exam Updated Vital Signs BP (!) 115/93   Pulse 81   Temp 98 F (36.7 C) (Oral)   Resp 18   SpO2 100%   Physical Exam  Constitutional: He appears well-developed and well-nourished.  HENT:  Head: Normocephalic.  Right Ear: External ear normal.  Left Ear: External ear normal.  Abrasions forehead nose, not bleeding.  Eyes: Pupils are equal, round, and reactive to light. Conjunctivae and EOM are normal. Left eye exhibits no discharge.  Neck: Normal range of motion and phonation normal. Neck  supple.  Cardiovascular: Normal rate, regular rhythm and normal heart sounds.   Pulmonary/Chest: Effort normal and breath sounds normal. He exhibits no bony tenderness.  Abdominal: Soft. There is no tenderness.  Musculoskeletal: Normal range of motion.  Neurological: He is alert. No cranial nerve deficit or sensory deficit. He exhibits normal muscle tone. Coordination normal. GCS eye subscore is 4. GCS verbal subscore is 4. GCS motor subscore is 6.  Skin: Skin is warm, dry and intact.  Psychiatric: He has a normal mood and affect. His behavior is normal.  Nursing note and vitals reviewed.    ED Treatments / Results  Labs (all labs ordered are listed, but only abnormal  results are displayed) Labs Reviewed - No data to display  EKG  EKG Interpretation None       Radiology No results found.  Procedures Procedures (including critical care time)  Medications Ordered in ED Medications - No data to display   Initial Impression / Assessment and Plan / ED Course  I have reviewed the triage vital signs and the nursing notes.  Pertinent labs & imaging results that were available during my care of the patient were reviewed by me and considered in my medical decision making (see chart for details).      Patient Vitals for the past 24 hrs:  BP Temp Temp src Pulse Resp SpO2  09/22/16 2230 (!) 115/93 - - 81 18 100 %  09/22/16 2200 121/86 - - 72 - 99 %  09/22/16 2130 110/84 - - 69 - 98 %  09/22/16 2100 121/83 - - 72 - 98 %  09/22/16 2045 120/84 - - 74 10 98 %  09/22/16 2030 (!) 139/99 - - 76 - 98 %  09/22/16 2013 (!) 149/102 98 F (36.7 C) Oral 76 16 99 %    Ambulation trial, patient unable to walk.    Final Clinical Impressions(s) / ED Diagnoses   Final diagnoses:  Fall at home, initial encounter  Alcoholic intoxication without complication (HCC)   Intoxication likely alcohol related, with fall.  Doubt serious injury, including head injury, visceral injury or spinal injury.  Nursing Notes Reviewed/ Care Coordinated Applicable Imaging Reviewed Interpretation of Laboratory Data incorporated into ED treatment  Plan-observe in the emergency department until sober, and able to manage his own affairs.  New Prescriptions New Prescriptions   No medications on file     Mancel Bale, MD 09/23/16 1536

## 2016-09-23 ENCOUNTER — Emergency Department (HOSPITAL_COMMUNITY): Payer: Self-pay

## 2016-09-23 LAB — COMPREHENSIVE METABOLIC PANEL
ALT: 16 U/L — ABNORMAL LOW (ref 17–63)
AST: 22 U/L (ref 15–41)
Albumin: 3.4 g/dL — ABNORMAL LOW (ref 3.5–5.0)
Alkaline Phosphatase: 75 U/L (ref 38–126)
Anion gap: 9 (ref 5–15)
CHLORIDE: 110 mmol/L (ref 101–111)
CO2: 25 mmol/L (ref 22–32)
Calcium: 8.4 mg/dL — ABNORMAL LOW (ref 8.9–10.3)
Creatinine, Ser: 0.84 mg/dL (ref 0.61–1.24)
Glucose, Bld: 88 mg/dL (ref 65–99)
POTASSIUM: 3.5 mmol/L (ref 3.5–5.1)
Sodium: 144 mmol/L (ref 135–145)
Total Bilirubin: 0.9 mg/dL (ref 0.3–1.2)
Total Protein: 6.5 g/dL (ref 6.5–8.1)

## 2016-09-23 LAB — CBC WITH DIFFERENTIAL/PLATELET
Basophils Absolute: 0 10*3/uL (ref 0.0–0.1)
Basophils Relative: 1 %
Eosinophils Absolute: 0.3 10*3/uL (ref 0.0–0.7)
Eosinophils Relative: 7 %
HEMATOCRIT: 39.7 % (ref 39.0–52.0)
HEMOGLOBIN: 13.1 g/dL (ref 13.0–17.0)
LYMPHS PCT: 32 %
Lymphs Abs: 1.3 10*3/uL (ref 0.7–4.0)
MCH: 28.6 pg (ref 26.0–34.0)
MCHC: 33 g/dL (ref 30.0–36.0)
MCV: 86.7 fL (ref 78.0–100.0)
Monocytes Absolute: 0.2 10*3/uL (ref 0.1–1.0)
Monocytes Relative: 6 %
NEUTROS PCT: 56 %
Neutro Abs: 2.3 10*3/uL (ref 1.7–7.7)
Platelets: 267 10*3/uL (ref 150–400)
RBC: 4.58 MIL/uL (ref 4.22–5.81)
RDW: 15.8 % — ABNORMAL HIGH (ref 11.5–15.5)
WBC: 4.1 10*3/uL (ref 4.0–10.5)

## 2016-09-23 LAB — ETHANOL: ALCOHOL ETHYL (B): 217 mg/dL — AB (ref <5)

## 2016-09-23 LAB — CK: Total CK: 333 U/L (ref 49–397)

## 2016-09-23 MED ORDER — ACETAMINOPHEN 325 MG PO TABS
650.0000 mg | ORAL_TABLET | Freq: Once | ORAL | Status: AC
  Administered 2016-09-23: 650 mg via ORAL
  Filled 2016-09-23: qty 2

## 2016-09-23 NOTE — ED Notes (Signed)
Pt. Wouldn't sit still for wound care on forehead and nose.

## 2016-09-23 NOTE — Discharge Instructions (Signed)
Start taking a multivitamin and folic acid daily, avoid drinking alcohol, please follow up with some of the outpatient treatment resources

## 2016-09-23 NOTE — ED Notes (Signed)
Patient transported to CT 

## 2016-09-23 NOTE — ED Notes (Signed)
Tried to ambulate pt, he was not able to stand for more than a few seconds, was in pain in back and legs, complaining of dizziness and starting shaking while sitting at the end of bed, we had to slide him back up in bed and change his clothes, urinated in bed previous to ambulation.

## 2016-09-23 NOTE — ED Notes (Signed)
Attempted to ambulate pt and pt still very unstable on his feet.

## 2016-09-23 NOTE — ED Provider Notes (Addendum)
Pt signed out to me this morning from Dr Preston Fleeting.  Pt initially seen by Dr Effie Shy.  Assumed to be intoxicated.  He has been seen numerous times in the past for this.  Pt has been here for approximately 11 hours.  He is not able to stand and bear his own weight.  Labs and xrays ordered by Dr Preston Fleeting.  I will follow up on those results.   Linwood Dibbles, MD 09/23/16 [phone]  Pt examined.  States he has had this trouble for over 6 months.  His right leg often gives out.  Not from pain but just feels weak. Pt is awake and alert.  Answers questions appropriately.  GCS 15.  Pt is able to lift both legs off the bed.  Able to plantar flex.  Normal sensation.    8:53 AM Labs and xrays are back.  Xrays are reassuring.  No acute findings to account for his sx.  Labs are notable for ETOH level of 217.  This was drawn after he was here for 11 hours.  I suspect his alcohol level is the acute issue today.  Doubt any acute neurologic emergency.    Discussed findings with patient.  Encouraged him to abstain from alcohol and discussed outpatient resources.   Linwood Dibbles, MD 09/23/16 0930

## 2016-09-23 NOTE — ED Provider Notes (Signed)
Patient initially seen and evaluated by Dr. Effie Shy following fall and was intoxicated following ingestion of 240 ounces of beer. He has been observed in the emergency department overnight and has been resting comfortably. Unfortunately, on attempting to ambulate him, he is unable to support his weight whatsoever. He does move his legs equally. He has some facial abrasions. He is a little slow to answer questions but is oriented to person. He does not have memory of what happened last night. There is marked tenderness diffusely throughout the thoracic and lumbar spine. Because of failure to respond to allowing his ethanol to metabolize, workup is initiated. He'll be sent for CT of head and cervical spine as well as plain films of thoracic and lumbar spine. Screening labs are obtained including CK. Case is signed out to Dr. Lynelle Doctor.   Dione Booze, MD 09/23/16 [phone]

## 2016-10-07 ENCOUNTER — Inpatient Hospital Stay (HOSPITAL_COMMUNITY)
Admission: EM | Admit: 2016-10-07 | Discharge: 2016-10-09 | DRG: 086 | Disposition: A | Discharge status: Home / Self Care | Payer: Self-pay | Attending: Internal Medicine | Admitting: Internal Medicine

## 2016-10-07 ENCOUNTER — Emergency Department (HOSPITAL_COMMUNITY): Payer: Self-pay

## 2016-10-07 ENCOUNTER — Encounter (HOSPITAL_COMMUNITY): Payer: Self-pay | Admitting: Emergency Medicine

## 2016-10-07 DIAGNOSIS — W19XXXA Unspecified fall, initial encounter: Secondary | ICD-10-CM

## 2016-10-07 DIAGNOSIS — F329 Major depressive disorder, single episode, unspecified: Secondary | ICD-10-CM | POA: Diagnosis present

## 2016-10-07 DIAGNOSIS — Z79899 Other long term (current) drug therapy: Secondary | ICD-10-CM

## 2016-10-07 DIAGNOSIS — S065XAA Traumatic subdural hemorrhage with loss of consciousness status unknown, initial encounter: Secondary | ICD-10-CM | POA: Diagnosis present

## 2016-10-07 DIAGNOSIS — Y92007 Garden or yard of unspecified non-institutional (private) residence as the place of occurrence of the external cause: Secondary | ICD-10-CM

## 2016-10-07 DIAGNOSIS — F10120 Alcohol abuse with intoxication, uncomplicated: Secondary | ICD-10-CM | POA: Diagnosis present

## 2016-10-07 DIAGNOSIS — F10929 Alcohol use, unspecified with intoxication, unspecified: Secondary | ICD-10-CM

## 2016-10-07 DIAGNOSIS — W1830XA Fall on same level, unspecified, initial encounter: Secondary | ICD-10-CM | POA: Diagnosis present

## 2016-10-07 DIAGNOSIS — F10220 Alcohol dependence with intoxication, uncomplicated: Secondary | ICD-10-CM | POA: Diagnosis present

## 2016-10-07 DIAGNOSIS — S065X0A Traumatic subdural hemorrhage without loss of consciousness, initial encounter: Principal | ICD-10-CM | POA: Diagnosis present

## 2016-10-07 DIAGNOSIS — S065X9A Traumatic subdural hemorrhage with loss of consciousness of unspecified duration, initial encounter: Secondary | ICD-10-CM | POA: Diagnosis present

## 2016-10-07 DIAGNOSIS — F17210 Nicotine dependence, cigarettes, uncomplicated: Secondary | ICD-10-CM | POA: Diagnosis present

## 2016-10-07 DIAGNOSIS — I10 Essential (primary) hypertension: Secondary | ICD-10-CM | POA: Diagnosis present

## 2016-10-07 DIAGNOSIS — S01102A Unspecified open wound of left eyelid and periocular area, initial encounter: Secondary | ICD-10-CM | POA: Diagnosis present

## 2016-10-07 LAB — PROTIME-INR
INR: 1.09
Prothrombin Time: 14 seconds (ref 11.4–15.2)

## 2016-10-07 LAB — CBC WITH DIFFERENTIAL/PLATELET
BASOS PCT: 0 %
Basophils Absolute: 0 10*3/uL (ref 0.0–0.1)
EOS PCT: 3 %
Eosinophils Absolute: 0.2 10*3/uL (ref 0.0–0.7)
HEMATOCRIT: 37.4 % — AB (ref 39.0–52.0)
HEMOGLOBIN: 12.6 g/dL — AB (ref 13.0–17.0)
Lymphocytes Relative: 28 %
Lymphs Abs: 1.4 10*3/uL (ref 0.7–4.0)
MCH: 29.2 pg (ref 26.0–34.0)
MCHC: 33.7 g/dL (ref 30.0–36.0)
MCV: 86.6 fL (ref 78.0–100.0)
MONOS PCT: 7 %
Monocytes Absolute: 0.4 10*3/uL (ref 0.1–1.0)
NEUTROS PCT: 62 %
Neutro Abs: 3.3 10*3/uL (ref 1.7–7.7)
Platelets: 263 10*3/uL (ref 150–400)
RBC: 4.32 MIL/uL (ref 4.22–5.81)
RDW: 15.4 % (ref 11.5–15.5)
WBC: 5.2 10*3/uL (ref 4.0–10.5)

## 2016-10-07 LAB — COMPREHENSIVE METABOLIC PANEL
ALBUMIN: 3.2 g/dL — AB (ref 3.5–5.0)
ALT: 16 U/L — AB (ref 17–63)
AST: 22 U/L (ref 15–41)
Alkaline Phosphatase: 76 U/L (ref 38–126)
Anion gap: 9 (ref 5–15)
BILIRUBIN TOTAL: 0.6 mg/dL (ref 0.3–1.2)
CO2: 26 mmol/L (ref 22–32)
CREATININE: 0.83 mg/dL (ref 0.61–1.24)
Calcium: 8.2 mg/dL — ABNORMAL LOW (ref 8.9–10.3)
Chloride: 105 mmol/L (ref 101–111)
GFR calc Af Amer: >60 mL/min (ref >60)
GLUCOSE: 103 mg/dL — AB (ref 65–99)
POTASSIUM: 3.2 mmol/L — AB (ref 3.5–5.1)
Sodium: 140 mmol/L (ref 135–145)
TOTAL PROTEIN: 6.1 g/dL — AB (ref 6.5–8.1)

## 2016-10-07 LAB — I-STAT CHEM 8, ED
BUN: <3 mg/dL — ABNORMAL LOW (ref 6–20)
CALCIUM ION: 0.99 mmol/L — AB (ref 1.15–1.40)
CREATININE: 1.1 mg/dL (ref 0.61–1.24)
Chloride: 103 mmol/L (ref 101–111)
Glucose, Bld: 104 mg/dL — ABNORMAL HIGH (ref 65–99)
HEMATOCRIT: 42 % (ref 39.0–52.0)
HEMOGLOBIN: 14.3 g/dL (ref 13.0–17.0)
Potassium: 3.2 mmol/L — ABNORMAL LOW (ref 3.5–5.1)
SODIUM: 143 mmol/L (ref 135–145)
TCO2: 30 mmol/L (ref 22–32)

## 2016-10-07 LAB — APTT: aPTT: 31 seconds (ref 24–36)

## 2016-10-07 LAB — ETHANOL: Alcohol, Ethyl (B): 294 mg/dL — ABNORMAL HIGH (ref <10)

## 2016-10-07 MED ORDER — SODIUM CHLORIDE 0.9% FLUSH
3.0000 mL | Freq: Two times a day (BID) | INTRAVENOUS | Status: DC
  Administered 2016-10-08 – 2016-10-09 (×3): 3 mL via INTRAVENOUS

## 2016-10-07 MED ORDER — VITAMIN B-1 100 MG PO TABS
100.0000 mg | ORAL_TABLET | Freq: Every day | ORAL | Status: DC
  Administered 2016-10-07 – 2016-10-09 (×3): 100 mg via ORAL
  Filled 2016-10-07 (×3): qty 1

## 2016-10-07 MED ORDER — SODIUM CHLORIDE 0.9 % IV BOLUS (SEPSIS)
1000.0000 mL | Freq: Once | INTRAVENOUS | Status: AC
  Administered 2016-10-07: 1000 mL via INTRAVENOUS

## 2016-10-07 MED ORDER — ACETAMINOPHEN 325 MG PO TABS
650.0000 mg | ORAL_TABLET | Freq: Four times a day (QID) | ORAL | Status: DC | PRN
  Administered 2016-10-07 – 2016-10-08 (×2): 650 mg via ORAL
  Filled 2016-10-07 (×3): qty 2

## 2016-10-07 MED ORDER — SODIUM CHLORIDE 0.9 % IV SOLN
500.0000 mg | Freq: Two times a day (BID) | INTRAVENOUS | Status: DC
  Administered 2016-10-07 – 2016-10-09 (×5): 500 mg via INTRAVENOUS
  Filled 2016-10-07 (×6): qty 5

## 2016-10-07 MED ORDER — LIDOCAINE-EPINEPHRINE-TETRACAINE (LET) SOLUTION
3.0000 mL | Freq: Once | NASAL | Status: AC
  Administered 2016-10-07: 3 mL via TOPICAL
  Filled 2016-10-07: qty 3

## 2016-10-07 MED ORDER — IOPAMIDOL (ISOVUE-300) INJECTION 61%
INTRAVENOUS | Status: AC
  Administered 2016-10-07: 100 mL
  Filled 2016-10-07: qty 100

## 2016-10-07 MED ORDER — LORAZEPAM 2 MG/ML IJ SOLN
2.0000 mg | INTRAMUSCULAR | Status: DC | PRN
  Administered 2016-10-08: 2 mg via INTRAVENOUS
  Filled 2016-10-07: qty 1

## 2016-10-07 MED ORDER — FOLIC ACID 1 MG PO TABS
1.0000 mg | ORAL_TABLET | Freq: Every day | ORAL | Status: DC
  Administered 2016-10-07 – 2016-10-09 (×3): 1 mg via ORAL
  Filled 2016-10-07 (×3): qty 1

## 2016-10-07 MED ORDER — SODIUM CHLORIDE 0.9 % IV SOLN
INTRAVENOUS | Status: AC
  Administered 2016-10-07: 18:00:00 via INTRAVENOUS

## 2016-10-07 MED ORDER — ACETAMINOPHEN 650 MG RE SUPP: 650.0000 mg | Freq: Four times a day (QID) | RECTAL | Status: DC | PRN

## 2016-10-07 NOTE — ED Triage Notes (Signed)
Pt transported from his home after [employer] witnessed pt fall onto his lawn and strike his head on log. +etoh, laceration noted above L eyebrow, no reported LOC. When EMS arrived pt found urinating on side of house.  Towel roll around neck. No blood thinners noted. Swelling noted to L jaw, this is not a new concern.

## 2016-10-07 NOTE — Consult Note (Signed)
Chief Complaint   Chief Complaint  Patient presents with  . Fall   HPI   HPI: Rodney Hartman is a 56 y.o. male who was brought to ER by [employer] after being seen fall in front lawn and striking head on log earlier this morning. No LOC. EtOH on board. Alcohol dependent. Drinks "enough" per day - does not elaborate. Complains of left sided headache. Moderate in severity. No associated nausea, vomiting, dizziness, changes in vision, motor/sensory deficits. Not on anti-coag.    Patient Active Problem List   Diagnosis Date Noted  . Atypical chest pain   . Acute congestive heart failure (HCC) 08/12/2015  . Anemia 08/12/2015  . HTN (hypertension) 08/12/2015  . Alcohol abuse with intoxication, uncomplicated (HCC) 07/31/2015  . MDD (major depressive disorder), recurrent severe, without psychosis (HCC) 07/31/2015  . Suicidal ideation 07/31/2015  . Homicidal ideation 07/31/2015  . Alcohol abuse with alcohol-induced mood disorder (HCC) 01/25/2015  . Alcohol use disorder, severe, dependence (HCC) 12/17/2014  . Alcohol abuse 08/07/2014    PMH: Past Medical History:  Diagnosis Date  . Alcohol abuse   . Depression   . Hypertension   . PUD (peptic ulcer disease)     PSH: Past Surgical History:  Procedure Laterality Date  . BACK SURGERY    . 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   . TONSILLECTOMY       (Not in a hospital admission)  SH: Social History  Substance Use Topics  . Smoking status: Current Some Day Smoker    Packs/day: 0.50    Types: Cigarettes  . Smokeless tobacco: Never Used  . Alcohol use 3.6 oz/week    6 Cans of beer per week     Comment: every other day     MEDS: Prior to Admission medications   Medication Sig Start Date End Date Taking? Authorizing Provider  ibuprofen (ADVIL,MOTRIN) 200 MG tablet Take 200 mg by mouth every 6 (six) hours as needed for mild pain.    [provider]  lisinopril (PRINIVIL,ZESTRIL) 20 MG tablet Take 1 tablet (20 mg total) by mouth daily. Patient not taking: Reported on 09/23/2016 08/15/15   Cathren Harsh, MD    ALLERGY: No Known Allergies  Social History  Substance Use Topics  . Smoking status: Current Some Day Smoker    Packs/day: 0.50    Types: Cigarettes  . Smokeless tobacco: Never Used  . Alcohol use 3.6 oz/week    6 Cans of beer per week     Comment: every other day      Family History  Problem Relation Age of Onset  . Heart disease Mother      ROS   Review of Systems  Constitutional: Negative.   HENT: Negative.   Eyes: Negative.   Respiratory: Negative.   Cardiovascular: Negative.   Gastrointestinal: Negative.   Genitourinary: Negative.   Musculoskeletal: Negative for back pain, falls, joint pain, myalgias and neck pain.  Skin: Negative.   Neurological: Positive for headaches. Negative for dizziness, tingling, tremors, sensory change, speech change, focal weakness, seizures and loss of consciousness.    Exam   Vitals:   10/07/16 0845 10/07/16 0900  BP: 103/72 119/85  Pulse: 81 71  Resp:    Temp:    SpO2: 96% 98%   General appearance: WDWN, NAD, resting comfortably Eyes: PERRL, Fundoscopic: normal Cardiovascular: Regular rate and rhythm without  murmurs, rubs, gallops. No edema or variciosities. Distal pulses normal. Pulmonary: Clear to auscultation Musculoskeletal:     Muscle tone upper extremities: Normal    Muscle tone lower extremities: Normal    Motor exam: Upper Extremities Deltoid Bicep Tricep Grip  Right 5/5 5/5 5/5 5/5  Left 5/5 5/5 5/5 5/5   Lower Extremity IP Quad PF DF EHL  Right 5/5 5/5 5/5 5/5 5/5  Left 5/5 5/5 5/5 5/5 5/5   Neurological Awake, alert, oriented x4 Memory and concentration grossly intact Speech fluent, appropriate CNII: Visual fields normal CNIII/IV/VI: EOMI CNV: Facial sensation normal CNVII: Symmetric, normal strength CNVIII: Grossly  normal CNIX: Normal palate movement CNXI: Trap and SCM strength normal CN XII: Tongue protrusion normal Sensation grossly intact to LT DTR: Normal  Results - Imaging/Labs   Results for orders placed or performed during the hospital encounter of 10/07/16 (from the past 48 hour(s))  Ethanol     Status: Abnormal   Collection Time: 10/07/16  9:15 AM  Result Value Ref Range   Alcohol, Ethyl (B) 294 (H) <10 mg/dL    Comment:        LOWEST DETECTABLE LIMIT FOR SERUM ALCOHOL IS 10 mg/dL FOR MEDICAL PURPOSES ONLY Please note change in reference range.   I-stat Chem 8, ED     Status: Abnormal   Collection Time: 10/07/16  9:19 AM  Result Value Ref Range   Sodium 143 135 - 145 mmol/L   Potassium 3.2 (L) 3.5 - 5.1 mmol/L   Chloride 103 101 - 111 mmol/L   BUN <3 (L) 6 - 20 mg/dL   Creatinine, Ser 0.10 0.61 - 1.24 mg/dL   Glucose, Bld 272 (H) 65 - 99 mg/dL   Calcium, Ion 5.36 (L) 1.15 - 1.40 mmol/L   TCO2 30 22 - 32 mmol/L   Hemoglobin 14.3 13.0 - 17.0 g/dL   HCT 64.4 03.4 - 74.2 %    Dg Chest 2 View  Result Date: 10/07/2016 CLINICAL DATA:  Fall.  Chest wall pain.  Initial encounter. EXAM: CHEST  2 VIEW COMPARISON:  Thoracic spine radiographs 09/23/2016 and chest radiographs 08/12/2015 FINDINGS: The cardiac silhouette is upper limits of normal in size, accentuated by AP technique and mildly low lung volumes. There is chronic blunting of the left costophrenic angle likely reflecting scarring with pleural thickening and calcification. No large pleural effusion or pneumothorax is identified. No acute airspace consolidation or edema is seen. No acute osseous abnormality is identified. IMPRESSION: Chronic left basilar scarring without evidence of acute abnormality. Electronically Signed   By: Sebastian Ache M.D.   On: 10/07/2016 09:16   Ct Head Wo Contrast  Result Date: 10/07/2016 CLINICAL DATA:  56 year old male with head, face and neck trauma. Facial swelling and lacerations. EXAM: CT HEAD  WITHOUT CONTRAST CT MAXILLOFACIAL WITHOUT CONTRAST CT CERVICAL SPINE WITHOUT CONTRAST TECHNIQUE: Multidetector CT imaging of the head, cervical spine, and maxillofacial structures were performed using the standard protocol without intravenous contrast. Multiplanar CT image reconstructions of the cervical spine and maxillofacial structures were also generated. COMPARISON:  09/23/2016 and prior CTs. FINDINGS: CT HEAD FINDINGS Brain: A right temporoparietal subdural hematoma is noted measuring 6 mm in the temporal region. There is no evidence of midline shift. No other hemorrhage identified. Mild generalized cerebral volume loss noted. No evidence of hydrocephalus, mass or acute infarction. Vascular: No hyperdense vessel or unexpected calcification. Skull: No acute abnormality. Other: None CT MAXILLOFACIAL FINDINGS Osseous: No acute fracture, subluxation or dislocation identified. Remote fractures of  both nasal bones, maxillary sinuses and medial right orbital wall again noted. Orbits: No acute fracture. Globes are unremarkable. No postseptal or intraconal abnormality. Sinuses: No acute abnormality Soft tissues: Left facial soft tissue swelling noted. CT CERVICAL SPINE FINDINGS Alignment: Normal Skull base and vertebrae: No acute fracture or focal bony lesion. Soft tissues and spinal canal: No prevertebral fluid or swelling. No visible canal hematoma. Disc levels: Posterior fusion changes from C3-C5 noted. Moderate degenerative disc disease at C6-7 again identified. Upper chest: No acute abnormality Other: None IMPRESSION: 1. Acute right temporoparietal subdural hematoma measuring up to 6 mm in greatest diameter. No evidence of midline shift. 2. Left facial soft tissue swelling without acute facial fracture. Multiple remote facial fractures. 3. No static evidence of acute injury to the cervical spine. Degenerative and surgical changes as described. Critical Value/emergent results were called by telephone at the time of  interpretation on 10/07/2016 at 10:44 am to [facility] , who verbally acknowledged these results. Electronically Signed   By: Harmon Pier M.D.   On: 10/07/2016 10:44   Ct Cervical Spine Wo Contrast  Result Date: 10/07/2016 CLINICAL DATA:  56 year old male with head, face and neck trauma. Facial swelling and lacerations. EXAM: CT HEAD WITHOUT CONTRAST CT MAXILLOFACIAL WITHOUT CONTRAST CT CERVICAL SPINE WITHOUT CONTRAST TECHNIQUE: Multidetector CT imaging of the head, cervical spine, and maxillofacial structures were performed using the standard protocol without intravenous contrast. Multiplanar CT image reconstructions of the cervical spine and maxillofacial structures were also generated. COMPARISON:  09/23/2016 and prior CTs. FINDINGS: CT HEAD FINDINGS Brain: A right temporoparietal subdural hematoma is noted measuring 6 mm in the temporal region. There is no evidence of midline shift. No other hemorrhage identified. Mild generalized cerebral volume loss noted. No evidence of hydrocephalus, mass or acute infarction. Vascular: No hyperdense vessel or unexpected calcification. Skull: No acute abnormality. Other: None CT MAXILLOFACIAL FINDINGS Osseous: No acute fracture, subluxation or dislocation identified. Remote fractures of both nasal bones, maxillary sinuses and medial right orbital wall again noted. Orbits: No acute fracture. Globes are unremarkable. No postseptal or intraconal abnormality. Sinuses: No acute abnormality Soft tissues: Left facial soft tissue swelling noted. CT CERVICAL SPINE FINDINGS Alignment: Normal Skull base and vertebrae: No acute fracture or focal bony lesion. Soft tissues and spinal canal: No prevertebral fluid or swelling. No visible canal hematoma. Disc levels: Posterior fusion changes from C3-C5 noted. Moderate degenerative disc disease at C6-7 again identified. Upper chest: No acute abnormality Other: None IMPRESSION: 1. Acute right temporoparietal subdural hematoma measuring  up to 6 mm in greatest diameter. No evidence of midline shift. 2. Left facial soft tissue swelling without acute facial fracture. Multiple remote facial fractures. 3. No static evidence of acute injury to the cervical spine. Degenerative and surgical changes as described. Critical Value/emergent results were called by telephone at the time of interpretation on 10/07/2016 at 10:44 am to [facility] , who verbally acknowledged these results. Electronically Signed   By: Harmon Pier M.D.   On: 10/07/2016 10:44   Ct Abdomen Pelvis W Contrast  Result Date: 10/07/2016 CLINICAL DATA:  Fall today with head injury. EXAM: CT ABDOMEN AND PELVIS WITH CONTRAST TECHNIQUE: Multidetector CT imaging of the abdomen and pelvis was performed using the standard protocol following bolus administration of intravenous contrast. CONTRAST:  ISOVUE-300 IOPAMIDOL (ISOVUE-300) INJECTION 61% COMPARISON:  Abdominopelvic CT 11/26/2014. FINDINGS: Lower chest: Probable postsurgical changes at the left lung base. The lung bases are otherwise clear. There is no significant pleural or pericardial  effusion. Hepatobiliary: The liver is normal in density without focal abnormality. No evidence of gallstones, gallbladder wall thickening or biliary dilatation. Pancreas: Unremarkable. No pancreatic ductal dilatation or surrounding inflammatory changes. Spleen: Normal in size without focal abnormality. Adrenals/Urinary Tract: Both adrenal glands appear normal. No evidence of urinary tract calculus or hydronephrosis. There is moderate suspicion of an enhancing mass in the infrahilar lip of the right kidney, measuring approximately 13 x 12 mm on image 23 of series 8. This demonstrates de enhancement, with a density of 111 HU on the immediate postcontrast images and 62 HU on the delayed images. Tiny low-density lesions in the upper pole the left kidney are too small to characterize, but not suspicious. The bladder appears unremarkable. Stomach/Bowel:  No evidence of bowel wall thickening, distention or surrounding inflammatory change. The appendix appears normal. Vascular/Lymphatic: There are no enlarged abdominal or pelvic lymph nodes. No significant vascular findings. Reproductive: The prostate gland and seminal vesicles appear stable. Other: No evidence of abdominal wall mass or hernia. No ascites. Musculoskeletal: No acute or significant osseous findings. There is mild lumbar spondylosis. IMPRESSION: 1. No acute or posttraumatic findings identified within the abdomen or pelvis. 2. **An incidental finding of potential clinical significance has been found. There is at least moderate suspicion of a small enhancing mass in the lower pole of the right kidney. This must be presumed to represent renal cell carcinoma until proven otherwise. Further evaluation recommended with dedicated nonemergent renal MRI, best scheduled as an outpatient.** Electronically Signed   By: Carey Bullocks M.D.   On: 10/07/2016 10:47   Ct Maxillofacial Wo Cm  Result Date: 10/07/2016 CLINICAL DATA:  56 year old male with head, face and neck trauma. Facial swelling and lacerations. EXAM: CT HEAD WITHOUT CONTRAST CT MAXILLOFACIAL WITHOUT CONTRAST CT CERVICAL SPINE WITHOUT CONTRAST TECHNIQUE: Multidetector CT imaging of the head, cervical spine, and maxillofacial structures were performed using the standard protocol without intravenous contrast. Multiplanar CT image reconstructions of the cervical spine and maxillofacial structures were also generated. COMPARISON:  09/23/2016 and prior CTs. FINDINGS: CT HEAD FINDINGS Brain: A right temporoparietal subdural hematoma is noted measuring 6 mm in the temporal region. There is no evidence of midline shift. No other hemorrhage identified. Mild generalized cerebral volume loss noted. No evidence of hydrocephalus, mass or acute infarction. Vascular: No hyperdense vessel or unexpected calcification. Skull: No acute abnormality. Other: None CT  MAXILLOFACIAL FINDINGS Osseous: No acute fracture, subluxation or dislocation identified. Remote fractures of both nasal bones, maxillary sinuses and medial right orbital wall again noted. Orbits: No acute fracture. Globes are unremarkable. No postseptal or intraconal abnormality. Sinuses: No acute abnormality Soft tissues: Left facial soft tissue swelling noted. CT CERVICAL SPINE FINDINGS Alignment: Normal Skull base and vertebrae: No acute fracture or focal bony lesion. Soft tissues and spinal canal: No prevertebral fluid or swelling. No visible canal hematoma. Disc levels: Posterior fusion changes from C3-C5 noted. Moderate degenerative disc disease at C6-7 again identified. Upper chest: No acute abnormality Other: None IMPRESSION: 1. Acute right temporoparietal subdural hematoma measuring up to 6 mm in greatest diameter. No evidence of midline shift. 2. Left facial soft tissue swelling without acute facial fracture. Multiple remote facial fractures. 3. No static evidence of acute injury to the cervical spine. Degenerative and surgical changes as described. Critical Value/emergent results were called by telephone at the time of interpretation on 10/07/2016 at 10:44 am to [facility] , who verbally acknowledged these results. Electronically Signed   By: Henrietta Hoover.D.  On: 10/07/2016 10:44   Impression/Plan   56 y.o. male with history of known alcohol dependence, brought to ER after falling and striking head on log. CT scan shows a small, traumatic SDH without mass effect/midline shift. He is neurologically intact. This small subdural is non-operative and should reabsorb with time.  - Rec admission under hospitalist service due to impending w/d from alcohol dependence - Keppra  Bid x7days for seizure prophylaxis - Monitor neuro exam q 1-2 hours - No need for repeat imaging unless his neuro exam changes since he is approx 8 hours out from head injury

## 2016-10-07 NOTE — ED Provider Notes (Signed)
[facility]-EMERGENCY DEPT Provider Note   CSN: [id number] Arrival date & time: 10/07/16  0255     History   Chief Complaint Chief Complaint  Patient presents with  . Fall    HPI Rodney Hartman is a 56 y.o. male.  HPI 56 year old African-American male presents to the ED by [employer] for witnessed fall and alcohol intoxication. Patient is able to provide limited information as he is not cooperative with exam. The patient transported to the ED by [employer] after a witnessed fall in lawn today. Patient was found urinating on side of the house. Patient fell striking his head on a log. [employer] denies any LOC or patient. Patient denies any blood thinners. Patient complains of pain all over. Specifically is low back and abd pain. Pt states that is tdap is utd. Pt states that he has been "drinking and fighting". Pt well known to ed and has been seen 6 times in the past 6 months with alcohol intoxication. Pt unable to give additional history.  Level 5 Caveat due to intoxication.  Past Medical History:  Diagnosis Date  . Alcohol abuse   . Depression   . Hypertension   . PUD (peptic ulcer disease)     Patient Active Problem List   Diagnosis Date Noted  . Atypical chest pain   . Acute congestive heart failure (HCC) 08/12/2015  . Anemia 08/12/2015  . HTN (hypertension) 08/12/2015  . Alcohol abuse with intoxication, uncomplicated (HCC) 07/31/2015  . MDD (major depressive disorder), recurrent severe, without psychosis (HCC) 07/31/2015  . Suicidal ideation 07/31/2015  . Homicidal ideation 07/31/2015  . Alcohol abuse with alcohol-induced mood disorder (HCC) 01/25/2015  . Alcohol use disorder, severe, dependence (HCC) 12/17/2014  . Alcohol abuse 08/07/2014    Past Surgical History:  Procedure Laterality Date  . BACK SURGERY    . 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   . TONSILLECTOMY         Home Medications    Prior to  Admission medications   Medication Sig Start Date End Date Taking? Authorizing Provider  ibuprofen (ADVIL,MOTRIN) 200 MG tablet Take 200 mg by mouth every 6 (six) hours as needed for mild pain.    [provider]  lisinopril (PRINIVIL,ZESTRIL) 20 MG tablet Take 1 tablet (20 mg total) by mouth daily. Patient not taking: Reported on 09/23/2016 08/15/15   Cathren Harsh, MD    Family History Family History  Problem Relation Age of Onset  . Heart disease Mother     Social History Social History  Substance Use Topics  . Smoking status: Current Some Day Smoker    Packs/day: 0.50    Types: Cigarettes  . Smokeless tobacco: Never Used  . Alcohol use 3.6 oz/week    6 Cans of beer per week     Comment: every other day      Allergies   Patient has no known allergies.   Review of Systems Review of Systems  Reason unable to perform ROS: intoxication.     Physical Exam Updated Vital Signs BP (!) 120/93 (BP Location: Right Arm)   Pulse 83   Temp 97.7 F (36.5 C) (Oral)   Resp 12   SpO2 98%   Physical Exam Physical Exam  Constitutional: Pt is oriented to person, place, and time. Appears well-developed and well-nourished. No distress.  HENT:  Head: Normocephalic. Pt with .5 cm laceration above  the right eyebrow. With bleeding controlled. Ears: No bilateral hemotympanum. Nose: Nose normal. No septal hematoma. Mouth/Throat: Uvula is midline, oropharynx is clear and moist and mucous membranes are normal.  Eyes: Conjunctivae and EOM are normal. Pupils are equal, round, and reactive to light.  Neck: No spinous process tenderness and no muscular tenderness present. No rigidity. Normal range of motion present.  Full ROM without pain Midline cervical tenderness No crepitus, deformity or step-offs Bilateral paraspinal tenderness  Cardiovascular: Normal rate, regular rhythm and intact distal pulses.   Pulses:      Radial pulses are 2+ on the right side, and 2+ on the left  side.       Dorsalis pedis pulses are 2+ on the right side, and 2+ on the left side.       Posterior tibial pulses are 2+ on the right side, and 2+ on the left side.  Pulmonary/Chest: Effort normal and breath sounds normal. No accessory muscle usage. No respiratory distress. No decreased breath sounds. No wheezes. No rhonchi. No rales. Exhibits bony tenderness. No flail segment, crepitus or deformity Equal chest expansion  Abdominal: Soft. Normal appearance and bowel sounds are normal. There is generalize tenderness, specifically ruq and rlq. There is no rigidity, no guarding and no CVA tenderness.  No ecchymosis. Abd soft  Musculoskeletal: Normal range of motion.       Thoracic back: Exhibits normal range of motion.       Lumbar back: Exhibits normal range of motion.  Full range of motion of the T-spine and L-spine No tenderness to palpation of the spinous processes of the T-spine  Tenderness to palpation of the spinous processes of the L spine No crepitus, deformity or step-offs Mild tenderness to palpation of the paraspinous muscles of the L-spine  Patient complains of right shoulder pain with palpation and range of motion the right shoulder. No obvious deformity, ecchymosis, edema, erythema, wound. Radial pulses 2+ bilaterally.  Lymphadenopathy:    Pt has no cervical adenopathy.  Neurological: Pt is alert and oriented to person, place, and time. Normal reflexes. No cranial nerve deficit. GCS eye subscore is 4. GCS verbal subscore is 5. GCS motor subscore is 6.  Reflex Scores:      Bicep reflexes are 2+ on the right side and 2+ on the left side.      Brachioradialis reflexes are 2+ on the right side and 2+ on the left side.      Patellar reflexes are 2+ on the right side and 2+ on the left side.      Achilles reflexes are 2+ on the right side and 2+ on the left side. Speech is clear and goal oriented, follows commands Normal 5/5 strength in upper and lower extremities bilaterally  including dorsiflexion and plantar flexion, strong and equal grip strength Sensation normal to light and sharp touch Moves extremities without ataxia, coordination intact Did not check gait and romberg due to likely alcohol intoxication.  No Clonus  Skin: Skin is warm and dry. No rash noted. Pt is not diaphoretic. No erythema.  Psychiatric: Normal mood and affect.  Nursing note and vitals reviewed.     ED Treatments / Results  Labs (all labs ordered are listed, but only abnormal results are displayed) Labs Reviewed  ETHANOL - Abnormal; Notable for the following:       Result Value   Alcohol, Ethyl (B) 294 (*)    All other components within normal limits  CBC WITH DIFFERENTIAL/PLATELET - Abnormal; Notable for  the following:    Hemoglobin 12.6 (*)    HCT 37.4 (*)    All other components within normal limits  I-STAT CHEM 8, ED - Abnormal; Notable for the following:    Potassium 3.2 (*)    BUN <3 (*)    Glucose, Bld 104 (*)    Calcium, Ion 0.99 (*)    All other components within normal limits  PROTIME-INR  APTT  COMPREHENSIVE METABOLIC PANEL    EKG  EKG Interpretation None       Radiology Dg Chest 2 View  Result Date: 10/07/2016 CLINICAL DATA:  Fall.  Chest wall pain.  Initial encounter. EXAM: CHEST  2 VIEW COMPARISON:  Thoracic spine radiographs 09/23/2016 and chest radiographs 08/12/2015 FINDINGS: The cardiac silhouette is upper limits of normal in size, accentuated by AP technique and mildly low lung volumes. There is chronic blunting of the left costophrenic angle likely reflecting scarring with pleural thickening and calcification. No large pleural effusion or pneumothorax is identified. No acute airspace consolidation or edema is seen. No acute osseous abnormality is identified. IMPRESSION: Chronic left basilar scarring without evidence of acute abnormality. Electronically Signed   By: Sebastian Ache M.D.   On: 10/07/2016 09:16   Ct Head Wo Contrast  Result Date:  10/07/2016 CLINICAL DATA:  56 year old male with head, face and neck trauma. Facial swelling and lacerations. EXAM: CT HEAD WITHOUT CONTRAST CT MAXILLOFACIAL WITHOUT CONTRAST CT CERVICAL SPINE WITHOUT CONTRAST TECHNIQUE: Multidetector CT imaging of the head, cervical spine, and maxillofacial structures were performed using the standard protocol without intravenous contrast. Multiplanar CT image reconstructions of the cervical spine and maxillofacial structures were also generated. COMPARISON:  09/23/2016 and prior CTs. FINDINGS: CT HEAD FINDINGS Brain: A right temporoparietal subdural hematoma is noted measuring 6 mm in the temporal region. There is no evidence of midline shift. No other hemorrhage identified. Mild generalized cerebral volume loss noted. No evidence of hydrocephalus, mass or acute infarction. Vascular: No hyperdense vessel or unexpected calcification. Skull: No acute abnormality. Other: None CT MAXILLOFACIAL FINDINGS Osseous: No acute fracture, subluxation or dislocation identified. Remote fractures of both nasal bones, maxillary sinuses and medial right orbital wall again noted. Orbits: No acute fracture. Globes are unremarkable. No postseptal or intraconal abnormality. Sinuses: No acute abnormality Soft tissues: Left facial soft tissue swelling noted. CT CERVICAL SPINE FINDINGS Alignment: Normal Skull base and vertebrae: No acute fracture or focal bony lesion. Soft tissues and spinal canal: No prevertebral fluid or swelling. No visible canal hematoma. Disc levels: Posterior fusion changes from C3-C5 noted. Moderate degenerative disc disease at C6-7 again identified. Upper chest: No acute abnormality Other: None IMPRESSION: 1. Acute right temporoparietal subdural hematoma measuring up to 6 mm in greatest diameter. No evidence of midline shift. 2. Left facial soft tissue swelling without acute facial fracture. Multiple remote facial fractures. 3. No static evidence of acute injury to the cervical  spine. Degenerative and surgical changes as described. Critical Value/emergent results were called by telephone at the time of interpretation on 10/07/2016 at 10:44 am to [facility] , who verbally acknowledged these results. Electronically Signed   By: Harmon Pier M.D.   On: 10/07/2016 10:44   Ct Cervical Spine Wo Contrast  Result Date: 10/07/2016 CLINICAL DATA:  56 year old male with head, face and neck trauma. Facial swelling and lacerations. EXAM: CT HEAD WITHOUT CONTRAST CT MAXILLOFACIAL WITHOUT CONTRAST CT CERVICAL SPINE WITHOUT CONTRAST TECHNIQUE: Multidetector CT imaging of the head, cervical spine, and maxillofacial structures were performed using the standard  protocol without intravenous contrast. Multiplanar CT image reconstructions of the cervical spine and maxillofacial structures were also generated. COMPARISON:  09/23/2016 and prior CTs. FINDINGS: CT HEAD FINDINGS Brain: A right temporoparietal subdural hematoma is noted measuring 6 mm in the temporal region. There is no evidence of midline shift. No other hemorrhage identified. Mild generalized cerebral volume loss noted. No evidence of hydrocephalus, mass or acute infarction. Vascular: No hyperdense vessel or unexpected calcification. Skull: No acute abnormality. Other: None CT MAXILLOFACIAL FINDINGS Osseous: No acute fracture, subluxation or dislocation identified. Remote fractures of both nasal bones, maxillary sinuses and medial right orbital wall again noted. Orbits: No acute fracture. Globes are unremarkable. No postseptal or intraconal abnormality. Sinuses: No acute abnormality Soft tissues: Left facial soft tissue swelling noted. CT CERVICAL SPINE FINDINGS Alignment: Normal Skull base and vertebrae: No acute fracture or focal bony lesion. Soft tissues and spinal canal: No prevertebral fluid or swelling. No visible canal hematoma. Disc levels: Posterior fusion changes from C3-C5 noted. Moderate degenerative disc disease at C6-7 again  identified. Upper chest: No acute abnormality Other: None IMPRESSION: 1. Acute right temporoparietal subdural hematoma measuring up to 6 mm in greatest diameter. No evidence of midline shift. 2. Left facial soft tissue swelling without acute facial fracture. Multiple remote facial fractures. 3. No static evidence of acute injury to the cervical spine. Degenerative and surgical changes as described. Critical Value/emergent results were called by telephone at the time of interpretation on 10/07/2016 at 10:44 am to [facility] , who verbally acknowledged these results. Electronically Signed   By: Harmon Pier M.D.   On: 10/07/2016 10:44   Ct Abdomen Pelvis W Contrast  Result Date: 10/07/2016 CLINICAL DATA:  Fall today with head injury. EXAM: CT ABDOMEN AND PELVIS WITH CONTRAST TECHNIQUE: Multidetector CT imaging of the abdomen and pelvis was performed using the standard protocol following bolus administration of intravenous contrast. CONTRAST:  ISOVUE-300 IOPAMIDOL (ISOVUE-300) INJECTION 61% COMPARISON:  Abdominopelvic CT 11/26/2014. FINDINGS: Lower chest: Probable postsurgical changes at the left lung base. The lung bases are otherwise clear. There is no significant pleural or pericardial effusion. Hepatobiliary: The liver is normal in density without focal abnormality. No evidence of gallstones, gallbladder wall thickening or biliary dilatation. Pancreas: Unremarkable. No pancreatic ductal dilatation or surrounding inflammatory changes. Spleen: Normal in size without focal abnormality. Adrenals/Urinary Tract: Both adrenal glands appear normal. No evidence of urinary tract calculus or hydronephrosis. There is moderate suspicion of an enhancing mass in the infrahilar lip of the right kidney, measuring approximately 13 x 12 mm on image 23 of series 8. This demonstrates de enhancement, with a density of 111 HU on the immediate postcontrast images and 62 HU on the delayed images. Tiny low-density lesions in  the upper pole the left kidney are too small to characterize, but not suspicious. The bladder appears unremarkable. Stomach/Bowel: No evidence of bowel wall thickening, distention or surrounding inflammatory change. The appendix appears normal. Vascular/Lymphatic: There are no enlarged abdominal or pelvic lymph nodes. No significant vascular findings. Reproductive: The prostate gland and seminal vesicles appear stable. Other: No evidence of abdominal wall mass or hernia. No ascites. Musculoskeletal: No acute or significant osseous findings. There is mild lumbar spondylosis. IMPRESSION: 1. No acute or posttraumatic findings identified within the abdomen or pelvis. 2. **An incidental finding of potential clinical significance has been found. There is at least moderate suspicion of a small enhancing mass in the lower pole of the right kidney. This must be presumed to represent renal cell  carcinoma until proven otherwise. Further evaluation recommended with dedicated nonemergent renal MRI, best scheduled as an outpatient.** Electronically Signed   By: Carey Bullocks M.D.   On: 10/07/2016 10:47   Ct Maxillofacial Wo Cm  Result Date: 10/07/2016 CLINICAL DATA:  56 year old male with head, face and neck trauma. Facial swelling and lacerations. EXAM: CT HEAD WITHOUT CONTRAST CT MAXILLOFACIAL WITHOUT CONTRAST CT CERVICAL SPINE WITHOUT CONTRAST TECHNIQUE: Multidetector CT imaging of the head, cervical spine, and maxillofacial structures were performed using the standard protocol without intravenous contrast. Multiplanar CT image reconstructions of the cervical spine and maxillofacial structures were also generated. COMPARISON:  09/23/2016 and prior CTs. FINDINGS: CT HEAD FINDINGS Brain: A right temporoparietal subdural hematoma is noted measuring 6 mm in the temporal region. There is no evidence of midline shift. No other hemorrhage identified. Mild generalized cerebral volume loss noted. No evidence of hydrocephalus,  mass or acute infarction. Vascular: No hyperdense vessel or unexpected calcification. Skull: No acute abnormality. Other: None CT MAXILLOFACIAL FINDINGS Osseous: No acute fracture, subluxation or dislocation identified. Remote fractures of both nasal bones, maxillary sinuses and medial right orbital wall again noted. Orbits: No acute fracture. Globes are unremarkable. No postseptal or intraconal abnormality. Sinuses: No acute abnormality Soft tissues: Left facial soft tissue swelling noted. CT CERVICAL SPINE FINDINGS Alignment: Normal Skull base and vertebrae: No acute fracture or focal bony lesion. Soft tissues and spinal canal: No prevertebral fluid or swelling. No visible canal hematoma. Disc levels: Posterior fusion changes from C3-C5 noted. Moderate degenerative disc disease at C6-7 again identified. Upper chest: No acute abnormality Other: None IMPRESSION: 1. Acute right temporoparietal subdural hematoma measuring up to 6 mm in greatest diameter. No evidence of midline shift. 2. Left facial soft tissue swelling without acute facial fracture. Multiple remote facial fractures. 3. No static evidence of acute injury to the cervical spine. Degenerative and surgical changes as described. Critical Value/emergent results were called by telephone at the time of interpretation on 10/07/2016 at 10:44 am to [facility] , who verbally acknowledged these results. Electronically Signed   By: Harmon Pier M.D.   On: 10/07/2016 10:44    Procedures .Marland KitchenLaceration Repair Date/Time: 10/07/2016 11:59 AM Performed by: Rise Mu Authorized by: Demetrios Loll T   Consent:    Consent obtained:  Verbal   Consent given by:  Patient   Risks discussed:  Infection, need for additional repair, nerve damage, poor wound healing, poor cosmetic result, pain, retained foreign body, tendon damage and vascular damage   Alternatives discussed:  No treatment Anesthesia (see MAR for exact dosages):    Anesthesia method:   Topical application   Topical anesthetic:  LET Laceration details:    Location:  Face   Face location:  L eyebrow   Length (cm):  0.5   Depth (mm):  1 Repair type:    Repair type:  Simple Pre-procedure details:    Preparation:  Patient was prepped and draped in usual sterile fashion and imaging obtained to evaluate for foreign bodies Exploration:    Hemostasis achieved with:  Direct pressure   Wound exploration: wound explored through full range of motion and entire depth of wound probed and visualized     Wound extent: no foreign bodies/material noted and no underlying fracture noted     Contaminated: no   Treatment:    Area cleansed with:  Betadine and saline   Amount of cleaning:  Standard   Irrigation solution:  Sterile saline   Irrigation method:  Pressure wash  Visualized foreign bodies/material removed: no   Skin repair:    Repair method:  Sutures   Suture size:  6-0   Suture material:  Nylon   Suture technique:  Simple interrupted   Number of sutures:  1 Approximation:    Approximation:  Close   Vermilion border: well-aligned   Post-procedure details:    Dressing:  Open (no dressing)   Patient tolerance of procedure:  Tolerated well, no immediate complications  .Critical Care Performed by: Rise Mu Authorized by: Demetrios Loll T   Critical care provider statement:    Critical care time (minutes):  30   Critical care was necessary to treat or prevent imminent or life-threatening deterioration of the following conditions:  Trauma (subdural bleed)   Critical care was time spent personally by me on the following activities:  Examination of patient, discussions with consultants, pulse oximetry, re-evaluation of patient's condition, review of old charts, ordering and review of laboratory studies, ordering and review of radiographic studies, ordering and performing treatments and interventions, blood draw for specimens, development of treatment plan with  patient or surrogate and obtaining history from patient or surrogate  CRITICAL CARE Performed by: Demetrios Loll   Total critical care time: 30 minutes  Subdural bleed.   Critical care time was exclusive of separately billable procedures and treating other patients.  Critical care was necessary to treat or prevent imminent or life-threatening deterioration.  Critical care was time spent personally by me on the following activities: development of treatment plan with patient and/or surrogate as well as nursing, discussions with consultants, evaluation of patient's response to treatment, examination of patient, obtaining history from patient or surrogate, ordering and performing treatments and interventions, ordering and review of laboratory studies, ordering and review of radiographic studies, pulse oximetry and re-evaluation of patient's condition.   (including critical care time)  Medications Ordered in ED Medications  levETIRAcetam (KEPPRA) 500 mg in sodium chloride 0.9 % 100 mL IVPB (not administered)  sodium chloride 0.9 % bolus 1,000 mL (1,000 mLs Intravenous New Bag/Given 10/07/16 0917)  iopamidol (ISOVUE-300) 61 % injection (100 mLs  Contrast Given 10/07/16 0943)  lidocaine-EPINEPHrine-tetracaine (LET) solution (3 mLs Topical Given 10/07/16 1112)     Initial Impression / Assessment and Plan / ED Course  I have reviewed the triage vital signs and the nursing notes.  Pertinent labs & imaging results that were available during my care of the patient were reviewed by me and considered in my medical decision making (see chart for details).     Patient presents to the ED for evaluation after a mechanical fall. Patient is intoxicated. History of alcohol abuse and has been seen several times in the ED for alcohol intoxication. Patient with small laceration to the left eyebrow with bleeding controlled. Patient states his tetanus shot is up-to-date. Laceration was repaired and cleaned  appropriately. Patient has some diffuse chest wall tenderness to palpation right shoulder pain to palpation or range of motion. Chest x-ray unremarkable. Patient complains of some right-sided abdominal pain on palpation. CT imaging of the abdomen was unremarkable for any acute injury but does show a renal cyst that needs outpatient MRI follow-up concerning for renal carcinoma until proven otherwise.  Did speak with the radiologist concerning patient's CT head that showed a 6 mm right temporoparietal subdural hematoma with no shift. Patient has slightly altered whether this is due to the subdural or alcohol intoxication. No other acute findings of the cervical spine and maxillofacial CT.   Patient is  alert to person and place and time.  Did speak with PA for neurosurgery who recommends hospital admission. Please see his separate note for recommendations.  Lab work was obtained. Platelets are normal. No leukocytosis. Coags normal. Alcohol is 294. Patient given fluid bolus.  No current signs of withdrawal. We'll likely need benzos due to chronic alcohol use. We'll consult internal medicine for admission.   Spoke with Resident for Internal Medicine who will come to see pt in ed and place admission orders.   Pt remains hemodynamically stable at this time. Updated on plan of care.   Pt seen and discussed with Dr. Patria Mane who is agreeable with the above plan.    Final Clinical Impressions(s) / ED Diagnoses   Final diagnoses:  SDH (subdural hematoma) (HCC)  Alcoholic intoxication with complication [facility])    New Prescriptions New Prescriptions   No medications on file     Wallace Keller 10/07/16 1235    Azalia Bilis, MD 10/07/16 1241

## 2016-10-07 NOTE — Progress Notes (Deleted)
Physical Exam

## 2016-10-07 NOTE — H&P (Signed)
Date: 10/07/2016               Patient Name:  Rodney Hartman MRN: [id number]  DOB: [birth year] Age / Sex: 56 y.o., male   PCP: Patient, No Pcp Per         Medical Service: Internal Medicine Teaching Service         Attending Physician: Dr. Azalia Bilis, MD    First Contact: Dr. Crista Elliot Pager: [phone]  Second Contact: Dr. Mikey Bussing Pager: [phone]       After Hours (After 5p/  First Contact Pager: [phone]  weekends / holidays): Second Contact Pager: [phone]   Chief Complaint: Fall  History of Present Illness: Rodney Hartman is a 56 year old male who presented following a fall resulting in trauma to the head. As per report from the ED physician, this occurred secondary to attempting to evade the [employer] after being observed urinating on a house in the front yard. He has a past medical history significant only for multiple injuries, alcohol intoxication and withdraw and hypertension. He was unable to provide the majority of the HPI as he remained moderately intoxicated. What could be gathered was that he began drinking in approximately 3 PM the prior day watching football with his friends. After this, he left his friend's house and things began to become "fuzzy" according to the patient. It was reported that while attempting to evade the police, he fell and struck his head denying loss of consciousness. Additionally he complains of low back pain and has been admitted to the [facility] emergency department 6 times in the past 6 months for alcohol intoxication.  He denied headache, chest pain, abdominal pain, muscle aches, fever, chills, nausea, vomiting, diarrhea, dysuria, or constipation. Patient attests only to blurry vision, and confusion. He states his vision is constantly fluctuating in and out of focus and he is unable to focus on anything in particular.  In the ED, serum EtOH level was remarkable at 294, CT head revealed a small right sided subdural hematoma without significant external  injury. Troponin was negative, potassium was 3.2 with other labs unremarkable. IMTS was called for admission after consulting Neurosurgery. Patient to be admitted and followed for change in mentation or neurological evaluation and possible repeat imaging following any changes. See A/P   Meds:  Current Meds  Medication Sig  . naproxen sodium (ANAPROX) 220 MG tablet Take 220 mg by mouth 2 (two) times daily as needed (back pain).    Allergies: Allergies as of 10/07/2016  . (No Known Allergies)   Past Medical History:  Diagnosis Date  . Alcohol abuse   . Depression   . Hypertension   . PUD (peptic ulcer disease)     Family History:  Family History  Problem Relation Age of Onset  . Heart disease Mother     Social History:  Social History  Substance Use Topics  . Smoking status: Current Some Day Smoker    Packs/day: 0.50    Types: Cigarettes  . Smokeless tobacco: Never Used  . Alcohol use 3.6 oz/week    6 Cans of beer per week     Comment: every other day     Review of Systems: A complete ROS was negative except as per HPI.   Physical Exam: Blood pressure 119/85, pulse 71, temperature 98 F (36.7 C), resp. rate 12, SpO2 98 %. Physical Exam  Constitutional: He appears well-developed and well-nourished. No distress.  HENT:  Head: Normocephalic.  Small laceration overlying  his mid forehead   Eyes:  Patient unable to fully track movement,  nystagmus bilaterally   Neck: Normal range of motion. No JVD present.  Cardiovascular: Normal rate and regular rhythm.   No murmur heard. Pulmonary/Chest: Effort normal and breath sounds normal. No stridor. No respiratory distress.  Abdominal: Soft. Bowel sounds are normal. He exhibits no distension.  Musculoskeletal: He exhibits no edema.  Neurological: He is alert. Coordination abnormal.  Patient appears to be intoxicated and unable to completely respond to all questions.   Skin: Skin is warm. He is not diaphoretic.    Psychiatric: He has a normal mood and affect. His speech is delayed. He is agitated. Cognition and memory are impaired.  Nursing note and vitals reviewed.  EKG: personally reviewed my interpretation is n/a  CXR: personally reviewed my interpretation is no acute abnormality or change from baseline   CT Head: small subdural hematoma, no midline shift observed  Assessment & Plan by Problem: Active Problems:   Alcohol abuse with intoxication, uncomplicated (HCC)   Fall  Rodney Hartman is a 56 year old male was admitted for subtotal hematoma following a fall. The fall is most likely associated with acute alcohol intoxication, with no additional abnormality noted. He'll placed on CIWA protocol with Ativan, neurochecks every 1-2 hours, and observation for recovery.  1. Alcohol abuse with intoxication Patient has a history of chronic alcohol abuse with recurrent serum alcohol level of /dL.  He will be placed on CIWA protocol with Ativan. To monitor for withdrawal Patient will be given thiamine and folic acid as per protocol Placed on 171ml/hr of normal saline  2. Fall, mostly secondary to acute alcohol intoxication CT of the head indicating small subdural hematoma Neurosurgery consulted and recommending admission under hospitalist service due to impending alcohol dependent withdrawal: Below as per Neurosurgery  -Keppra 500 mg BID for 7 days for seizure prophylaxis -Neurochecks every 1-2 hours -Current neuro exam is again unremarkable by primary team, will consider repeat imaging his exam changes at 8 hours following fall, no acute changes thus far -no clear indication as to discharge planning   Diet: Regular Fluids: 111ml/hr Dvt ppx: SCD's Code: Full Dispo: Admit patient to Inpatient with expected length of stay greater than 2 midnights.  Signed: Lanelle Bal, MD 10/07/2016, 11:56 AM  Pager: Pager# [phone]

## 2016-10-07 NOTE — ED Notes (Signed)
Eating ED sand which bag and drinking ice water.

## 2016-10-08 DIAGNOSIS — Z79899 Other long term (current) drug therapy: Secondary | ICD-10-CM

## 2016-10-08 DIAGNOSIS — Z7289 Other problems related to lifestyle: Secondary | ICD-10-CM

## 2016-10-08 DIAGNOSIS — W19XXXA Unspecified fall, initial encounter: Secondary | ICD-10-CM

## 2016-10-08 DIAGNOSIS — I10 Essential (primary) hypertension: Secondary | ICD-10-CM

## 2016-10-08 DIAGNOSIS — S065X9A Traumatic subdural hemorrhage with loss of consciousness of unspecified duration, initial encounter: Secondary | ICD-10-CM

## 2016-10-08 DIAGNOSIS — I6200 Nontraumatic subdural hemorrhage, unspecified: Secondary | ICD-10-CM

## 2016-10-08 DIAGNOSIS — F1010 Alcohol abuse, uncomplicated: Secondary | ICD-10-CM

## 2016-10-08 LAB — CBC
HEMATOCRIT: 37.6 % — AB (ref 39.0–52.0)
HEMOGLOBIN: 12.5 g/dL — AB (ref 13.0–17.0)
MCH: 29.2 pg (ref 26.0–34.0)
MCHC: 33.2 g/dL (ref 30.0–36.0)
MCV: 87.9 fL (ref 78.0–100.0)
Platelets: 259 10*3/uL (ref 150–400)
RBC: 4.28 MIL/uL (ref 4.22–5.81)
RDW: 15.9 % — ABNORMAL HIGH (ref 11.5–15.5)
WBC: 5.9 10*3/uL (ref 4.0–10.5)

## 2016-10-08 LAB — BASIC METABOLIC PANEL
Anion gap: 6 (ref 5–15)
BUN: 5 mg/dL — ABNORMAL LOW (ref 6–20)
CHLORIDE: 111 mmol/L (ref 101–111)
CO2: 24 mmol/L (ref 22–32)
CREATININE: 0.83 mg/dL (ref 0.61–1.24)
Calcium: 8.2 mg/dL — ABNORMAL LOW (ref 8.9–10.3)
GFR calc non Af Amer: >60 mL/min (ref >60)
Glucose, Bld: 99 mg/dL (ref 65–99)
POTASSIUM: 3.5 mmol/L (ref 3.5–5.1)
Sodium: 141 mmol/L (ref 135–145)

## 2016-10-08 LAB — HIV ANTIBODY (ROUTINE TESTING W REFLEX): HIV Screen 4th Generation wRfx: NONREACTIVE

## 2016-10-08 LAB — MRSA PCR SCREENING: MRSA by PCR: NEGATIVE

## 2016-10-08 MED ORDER — PROSIGHT PO TABS
1.0000 | ORAL_TABLET | Freq: Every day | ORAL | Status: DC
  Administered 2016-10-08 – 2016-10-09 (×2): 1 via ORAL
  Filled 2016-10-08 (×2): qty 1

## 2016-10-08 MED ORDER — ONDANSETRON HCL 4 MG/2ML IJ SOLN: 4.0000 mg | Freq: Once | INTRAMUSCULAR | Status: DC

## 2016-10-08 MED ORDER — SODIUM CHLORIDE 0.9 % IV SOLN
INTRAVENOUS | Status: AC
  Administered 2016-10-08 (×2): via INTRAVENOUS

## 2016-10-08 MED ORDER — IBUPROFEN 200 MG PO TABS
800.0000 mg | ORAL_TABLET | Freq: Four times a day (QID) | ORAL | Status: DC | PRN
  Administered 2016-10-08 – 2016-10-09 (×4): 800 mg via ORAL
  Filled 2016-10-08 (×5): qty 4

## 2016-10-08 MED ORDER — ACETAMINOPHEN 325 MG PO TABS
650.0000 mg | ORAL_TABLET | Freq: Once | ORAL | Status: AC
  Administered 2016-10-08: 650 mg via ORAL
  Filled 2016-10-08: qty 2

## 2016-10-08 MED ORDER — LISINOPRIL 20 MG PO TABS
20.0000 mg | ORAL_TABLET | Freq: Every day | ORAL | Status: DC
  Administered 2016-10-08 – 2016-10-09 (×2): 20 mg via ORAL
  Filled 2016-10-08 (×2): qty 1

## 2016-10-08 NOTE — Progress Notes (Addendum)
Paged by nurse about the patient having a 10/10 headache. IMTS went to bedside to examine patient.   The patient states that he feels that his head is throbbing, present in the frontal region of his head, worsened in intensity since admission, and exacerbated with light and noise. He has accompanied nausea and dizziness but no blurred vision.   On exam, cn 2-10 and cn 12 intact, his strength in the upper extremities was 5/5, and sensation intact in both upper and lower extremities, and bilateral patellar reflexes intact. Heart sounds normal without any murmurs. The patient's blood pressure was between 160-170/100-115 on telemetry while we were in the patient's room.   -Pt was given tylenol for headache -Restarted home lisinopril  -Did not contact neurosurgery as no new neurological deficits  Lorenso Courier, MD Internal Medicine PGY1 Pager:[phone] 10/08/2016, 1:18 AM

## 2016-10-08 NOTE — Progress Notes (Signed)
I am a pharmacy student  rounding  with  the Internal Medicine B2 [employer]. WM  is a 56 year old male presenting with subdural hematoma following a fall most likely associated with acute alcohol intoxication. Naltrexone could  be started on this patient for treatment of alcohol use disorder. According to [employer], patient can be started  on 50 mg daily; some patients may require doses up to 100 mg/day. Of note, therapy should  not be initiated until patient is opioid-free (including tramadol) for at least 7 to 10 days as determined by urinalysis. No noted hepatitis so naltrexone is an appropriate medication for this patient.   Jacqualine Code PharmD Candidate [address]

## 2016-10-08 NOTE — Progress Notes (Signed)
Subjective:  Patient was seen laying in bed this morning. He was holding his head and continuing to endorse a frontal headache, which started last night. He states that the Tylenol helped and he would like more. He denies visual changes, focal weakness, difficulty with speech, and changes in sensation.  Objective:  Vital signs in last 24 hours: Vitals:   10/08/16 0339 10/08/16 0400 10/08/16 0845 10/08/16 1122  BP:  (!) 152/103 (!) 159/98 (!) 163/95  Pulse:  74 73 70  Resp:  19 16 (!) 21  Temp: 98.2 F (36.8 C) 98.4 F (36.9 C) 98.3 F (36.8 C) 97.9 F (36.6 C)  TempSrc: Oral Oral Oral Oral  SpO2:  98% 99% 98%  Weight:      Height:       Physical Exam  Constitutional: He is oriented to person, place, and time. He appears well-developed and well-nourished. No distress.  HENT:  Mouth/Throat: No oropharyngeal exudate.  Oropharynx dry  Eyes: Pupils are equal, round, and reactive to light. Conjunctivae and EOM are normal.  Cardiovascular: Normal rate and regular rhythm.  Exam reveals no friction rub.   No murmur heard. Pulmonary/Chest: Effort normal. No respiratory distress. He has no wheezes. He has no rales.  Abdominal: Soft. He exhibits no distension. There is no tenderness. There is no guarding.  Musculoskeletal: He exhibits no edema (of bilateral lower extremities) or tenderness (of bilateral lower extremities).  Neurological: He is alert and oriented to person, place, and time.  Face strength and sensation intact bilaterally. Tongue midline. 5/5 bicep, tricep, and grip strength bilaterally. 5/5 hip flexion, dorsiflexion, and plantarflexion bilaterally. Gross sensation to light touch of upper and lower extremities intact bilaterally.   Skin: Skin is warm and dry. Capillary refill takes less than 2 seconds. No erythema. No pallor.   Assessment/Plan:  Active Problems:   Alcohol abuse with intoxication, uncomplicated (HCC)   Fall   Subdural hematoma (HCC)  Rodney Hartman is a  56 yo with a PMH of HTN, depression, and alcohol abuse who presented to the [facility] ED on 10/07/16 who presented for evaluation after a fall while intoxicated. He was found to have a subdural hematoma without evidence of midline shift or neurologic defects. The patient was admitted to the internal medicine teaching service for management with neurosurgery consulting. The specific problems addressed during his admission were as follows:  Subdural hematoma: Acute, right temporoparietal subdural hematoma that measured 6mm in greatest dimension was observed on admission. No current neurological defects. Neurosurgery was consulted and they suggested no surgical intervention needed. They recommend Keppra 500 mg BID for 7 days for seizure prophylaxis and to watch clinically to ensure hematoma does not spread.  -Neuro checks q2 hour -Continue Keppra 500 mg BID for seizure prophylaxis -Ibuprofen 800 mg PRN and/or Tylenol 650 mg PRN for headache -CT head if patient clinically decompensates or has change in neuro status  Alcohol use disorder: Patient admitted with ethanol level >200. He admits to possible binge drinking behavior. He states that he stopped drinking alcohol completely in the past, but restarted drinking after the death of his sister about 1 year ago. Given his admission for subdural 2/2 fall while intoxicated and multiple ED visits for alcohol intoxication (6 in past 6 moths), discussed alcohol cessation with patient. He is somewhat interested. Will consult social work for resources.  -Social work consulted, appreciate recommendations -CIWA protocol while inpatient  Hypertension: SBP >160 off home lisinopril. BP improving with addition of home medication. Will  continue to monitor. -Lisinopril 20 mg daily  FEN: -NS at rate of 100 ml/hr -Thiamine and Folic acid on admission -Daily multivitamin while inpatient  DVT prophylaxis: SCDs while in bed, ambulate as able with PT  Dispo: Anticipated  discharge in approximately 1 day.   Rozann Lesches, MD 10/08/2016, 1:54 PM Pager: [phone]

## 2016-10-08 NOTE — Evaluation (Signed)
Physical Therapy Evaluation Patient Details Name: Rodney Hartman MRN: [id number] DOB: March 02, 1960 Today's Date: 10/08/2016   History of Present Illness  56 y.o. male admitted on 10/07/16 for ETOH intoxication with fall and resultant small R SDH as seen on CT scan.  Neurosurgery treating conservatively.  Pt with significant PMH of HTN, depression, alcohol abuse, neck surgery, and back surgery.   Clinical Impression  Pt was able to get up and walk around the RN station with me with close supervision for safety due to LOB x 2 to the right when first starting to walk.  He did better by the end of the walk, but was not challenged as he seemed annoyed by the request to walk and see how he was moving.  Bil UE coordination seems mildly impaired and he is mildly off balance during gait.  I do not believe it is significant enough to warrant f/u at discharge, but PT will follow acutely to ensure that balance deficits will not affect his safety at discharge and does not warrant an AD.   PT to follow acutely for deficits listed below.       Follow Up Recommendations No PT follow up    Equipment Recommendations  None recommended by PT    Recommendations for Other Services   NA    Precautions / Restrictions Precautions Precautions: Fall Precaution Comments: mildly unsteady on his feet      Mobility  Bed Mobility Overal bed mobility: Modified Independent                Transfers Overall transfer level: Modified independent                  Ambulation/Gait Ambulation/Gait assistance: Supervision Ambulation Distance (Feet): 120 Feet Assistive device: None Gait Pattern/deviations: Step-through pattern;Staggering right     General Gait Details: two right staggers in the first 50' x 2 with close supervision to recover.  Pt able to readjust without external assist, and seemed to do better as we went.  He was not agreeable to walk further (we did one lap).           Balance Overall  balance assessment: Needs assistance Sitting-balance support: Feet supported;No upper extremity supported Sitting balance-Leahy Scale: Good     Standing balance support: No upper extremity supported Standing balance-Leahy Scale: Good                               Pertinent Vitals/Pain Pain Assessment: 0-10 Pain Score: 10-Worst pain ever Pain Location: head Pain Descriptors / Indicators: Aching Pain Intervention(s): Limited activity within patient's tolerance;Monitored during session;Repositioned    Home Living Family/patient expects to be discharged to:: Private residence Living Arrangements: Other (Comment);Other relatives (nephew)   Type of Home: House Home Access: Stairs to enter Entrance Stairs-Rails: Right Entrance Stairs-Number of Steps: 4 Home Layout: One level Home Equipment: None      Prior Function Level of Independence: Independent         Comments: does not use a cane or RW at baseline     Hand Dominance   Dominant Hand: Right    Extremity/Trunk Assessment   Upper Extremity Assessment Upper Extremity Assessment: RUE deficits/detail;LUE deficits/detail RUE Deficits / Details: strength 5/5, coordination mildly impaired.  RUE Coordination: decreased gross motor;decreased fine motor LUE Deficits / Details: strength 5/5, coordination mildly impaired.  LUE Coordination: decreased gross motor;decreased fine motor    Lower Extremity Assessment Lower  Extremity Assessment: Overall WFL for tasks assessed (strength 5/5, sensation intact, heel shin test WNL)    Cervical / Trunk Assessment Cervical / Trunk Assessment: Other exceptions Cervical / Trunk Exceptions: pt with h/o neck and back surgery  Communication   Communication: No difficulties  Cognition Arousal/Alertness: Awake/alert Behavior During Therapy: Flat affect Overall Cognitive Status: Within Functional Limits for tasks assessed                                  General Comments: Not specifically tested as pt seemed annoyed with history questions already.  No one present to confirm home situation.              Assessment/Plan    PT Assessment Patient needs continued PT services  PT Problem List Decreased balance;Decreased mobility;Decreased coordination;Pain       PT Treatment Interventions Gait training;DME instruction;Stair training;Functional mobility training;Therapeutic activities;Therapeutic exercise;Balance training;Neuromuscular re-education;Cognitive remediation;Patient/family education    PT Goals (Current goals can be found in the Care Plan section)  Acute Rehab PT Goals Patient Stated Goal: to decrease head pain PT Goal Formulation: With patient Time For Goal Achievement: 10/22/16 Potential to Achieve Goals: Good    Frequency Min 3X/week           AM-PAC PT "6 Clicks" Daily Activity  Outcome Measure Difficulty turning over in bed (including adjusting bedclothes, sheets and blankets)?: None Difficulty moving from lying on back to sitting on the side of the bed? : None Difficulty sitting down on and standing up from a chair with arms (e.g., wheelchair, bedside commode, etc,.)?: None Help needed moving to and from a bed to chair (including a wheelchair)?: None Help needed walking in hospital room?: None Help needed climbing 3-5 steps with a railing? : A Little 6 Click Score: 23    End of Session Equipment Utilized During Treatment: Gait belt Activity Tolerance: Patient limited by pain Patient left: in bed;with call bell/phone within reach Nurse Communication: Mobility status PT Visit Diagnosis: Unsteadiness on feet (R26.81)    Time: 1610-9604 PT Time Calculation (min) (ACUTE ONLY): 17 min   Charges:        Lurena Joiner B. Monserat Prestigiacomo, PT, DPT [phone]    PT Evaluation $PT Eval Moderate Complexity: 1 Mod     10/08/2016, 12:19 PM

## 2016-10-09 MED ORDER — LISINOPRIL 20 MG PO TABS: 20.0000 mg | ORAL_TABLET | Freq: Every day | ORAL | 0 refills | Status: DC

## 2016-10-09 MED ORDER — LEVETIRACETAM 500 MG PO TABS: 500.0000 mg | ORAL_TABLET | Freq: Two times a day (BID) | ORAL | 0 refills | Status: DC

## 2016-10-09 MED ORDER — SODIUM CHLORIDE 0.9 % IV SOLN: INTRAVENOUS | Status: DC

## 2016-10-09 MED ORDER — NAPROXEN SODIUM 550 MG PO TABS: 550.0000 mg | ORAL_TABLET | Freq: Two times a day (BID) | ORAL | 0 refills | Status: DC | PRN

## 2016-10-09 NOTE — Discharge Summary (Signed)
Name: Rodney Hartman MRN: [id number] DOB: 09-25-1960 56 y.o. PCP: Patient, No Pcp Per  Date of Admission: 10/07/2016  6:57 AM Date of Discharge: 10/09/2016 Attending Physician: Anne Shutter, MD  Discharge Diagnosis:  Active Problems:   Alcohol abuse with intoxication, uncomplicated (HCC)   Fall   Subdural hematoma [facility])  Discharge Medications: Allergies as of 10/09/2016   No Known Allergies     Medication List    TAKE these medications   levETIRAcetam 500 MG tablet Commonly known as:  KEPPRA Take 1 tablet (500 mg total) by mouth 2 (two) times daily.   lisinopril 20 MG tablet Commonly known as:  PRINIVIL,ZESTRIL Take 1 tablet (20 mg total) by mouth daily.   naproxen sodium 550 MG tablet Commonly known as:  ANAPROX Take 1 tablet (550 mg total) by mouth 2 (two) times daily as needed (back pain). What changed:  medication strength  how much to take      Disposition and follow-up:   [name] was discharged from [facility] in Good condition.  At the hospital follow up visit please address:  1.  The patient was admitted for acute alcohol intoxication and management of a subdural hematoma which occurred after a fall while intoxicated earlier in the evening on the day of presentation. No surgical intervention was needed, as his hematoma did not cause mass effect or neurologic defects. He had a severe headache while inpatient and was instructed to take naproxen as an outpatient for this headache. Please assess symptoms of headache.  Patient also expressed interest in alcohol cessation as a result of this hospitalization. During hospitalization he did not show signs of alcohol withdrawal. He declined inpatient treatment and was given options for outpatient treatment by social work. Please follow up regarding patient's alcohol cessation. He states that in the past he successfully abstained from alcohol with the use of medications. Please assess if he  would be a good candidate for medical management of alcohol use disorder.  Patient also has history of depression and is not treated currently with any medications. Patient expressed interest in follow up with [facility], who previously counseled him about depression. Patient also wants to restart medications if he can afford them. Please assess if you think he would be a good candidate for medical management of his depression.   2.  Labs / imaging needed at time of follow-up: None  3.  Pending labs/ test needing follow-up: None  Follow-up Appointments: Follow-up Information    [facility] Follow up on 10/23/2016.   Why:  2;30 or hospital follow up apt Contact information: [place] [facility] [id number] [phone]         Hospital Course by problem list: Active Problems:   Alcohol abuse with intoxication, uncomplicated (HCC)   Fall   Subdural hematoma (HCC)   Rodney Hartman is a 56 yo with a PMH of HTN, depression, and alcohol abuse who presented to the [facility] ED on 10/07/16 who presented for evaluation after a fall while intoxicated. He was found to have an acute right temporoparietal subdural hematoma measuring 6 mm in greatest dimension without evidence of midline shift or neurologic defects on physical exam. The patient was admitted to the internal medicine teaching service for management with neurosurgery consulting. The specific problems addressed during his admission were as follows:  Acute right temporoparietal subdural hematoma:The patient had no neurological defects from subdural hematoma but endorsed a severe headache.  Neurosurgery was consulted on admission and they suggested no surgical intervention necessary given absence of midline shift and neurologic defects. He was prescribed Keppra 500 mg BID for 7 days for seizure prophylaxis and watched for 48 hours to ensure hematoma did not spread. The patient was  ambulating independently, managing his headache with oral medications, and did not have any neurologic deficits on exam.  Alcohol use disorder with hx of Depression:Patient admitted with ethanol level >200. He admits to binge drinking behavior and drinking 4 out of the 7 days a week.Case management and social work were consulted and provided resource for the patient while he was hospitalized. They provided materials for outpatient treatment of alcohol use disorder. Patient previously treated at [facility] for depression. There appears to be a significant component of depression that fuels alcohol use disorder, as patient states he started drinking heavily after the death of his sister last year. Patient expressed interest in medical management for depression in addition to counseling. Please assess whether patient would be a good candidate for outpatient medical management and [facility] follow up.  Hypertension: Patient remained normotensive during hospitalization on daily lisinopril 20 mg. Please reassess as outpatient.  Discharge Vitals:   BP 135/82   Pulse 70   Temp 98.1 F (36.7 C) (Oral)   Resp 13   Ht  (1.778 m)   Wt 201 lb 11.5 oz (91.5 kg)   SpO2 100%   BMI 28.94 kg/m   Pertinent Labs, Studies, and Procedures:   CBC    Component Value Date/Time   WBC 5.9 10/08/2016 0315   RBC 4.28 10/08/2016 0315   HGB 12.5 (L) 10/08/2016 0315   HCT 37.6 (L) 10/08/2016 0315   PLT 259 10/08/2016 0315   MCV 87.9 10/08/2016 0315   MCH 29.2 10/08/2016 0315   MCHC 33.2 10/08/2016 0315   RDW 15.9 (H) 10/08/2016 0315   LYMPHSABS 1.4 10/07/2016 1100   MONOABS 0.4 10/07/2016 1100   EOSABS 0.2 10/07/2016 1100   BASOSABS 0.0 10/07/2016 1100   BMP    Component Value Date/Time   NA 141 10/08/2016 0315   K 3.5 10/08/2016 0315   CL 111 10/08/2016 0315   CO2 24 10/08/2016 0315   GLUCOSE 99 10/08/2016 0315   BUN 5 (L) 10/08/2016 0315   CREATININE 0.83 10/08/2016 0315   CALCIUM 8.2 (L)  10/08/2016 0315   GFRNONAA >60 10/08/2016 0315   GFRAA >60 10/08/2016 0315   Alcohol Level    Component Value Date/Time   ETH 294 (H) 10/07/2016 0915   CT Head w/o Contrast and Maxillofacial/Cervical Spine: IMPRESSION: 1. Acute right temporoparietal subdural hematoma measuring up to 6 mm in greatest diameter. No evidence of midline shift. 2. Left facial soft tissue swelling without acute facial fracture. Multiple remote facial fractures. 3. No static evidence of acute injury to the cervical spine. Degenerative and surgical changes as described. Critical Value/emergent results were called by telephone at the time of interpretation on 10/07/2016 at 10:44 am to [facility] , who verbally acknowledged these results.  Discharge Instructions: Discharge Instructions    Call MD for:  difficulty breathing, headache or visual disturbances    Complete by:  As directed    Call MD for:  persistant dizziness or light-headedness    Complete by:  As directed    Call MD for:  persistant nausea and vomiting    Complete by:  As directed    Call MD for:  temperature >100.4    Complete by:  As directed    Diet - low sodium heart healthy    Complete by:  As directed    Discharge instructions    Complete by:  As directed    You were treated for an acute head bleed after a fall while intoxicated. Neurosurgery was consulted during your admission. They did not think you need surgery to fix this bleeding. You will, however, need to take medicine to help prevent seizures. This medication is called Keppra and you will have to take 2 pills a day until you run out of medicine. You should also refrain from drinking alcohol, as you had this acute bleed as a result from a fall while intoxicated. You were given resources to help you quit drinking during your hospital stay. Please use these resources to help quit drinking. Please return to the emergency room if you have worsening of your headache, seizures,  tremors, excessive sweating, or nausea/vomiting.   Please follow up at [facility] ([address] [place] [address]) on Tuesday, October 16 at 2:30pm to establish care with a primary care physician for management of your chronic medical conditions.   Increase activity slowly    Complete by:  As directed       Signed: Rozann Lesches, MD 10/09/2016, 3:36 PM   Pager: [phone]

## 2016-10-09 NOTE — Progress Notes (Signed)
Discharge Note:  Patient alert and oriented X 4 and in no distress. Patient given discharge instructions regarding signs and symptoms to report, medication, diet, activity, and upcoming appointments.  He verbalized understanding of all instructions.  Peripheral IV's and telemetry discontinued. Patient confirmed that he had all of his personal belongings.  He was transported out via wheelchair by NT.

## 2016-10-09 NOTE — Care Management Note (Addendum)
Case Management Note  Patient Details  Name: Rodney Hartman MRN: [id number] Date of Birth: 01/22/1960  Subjective/Objective:   From home, presents with ETOH  Intoxication, fall and SDH.  NCM scheduled hospital follow up at [facility] for 10/16 at 2:30.  NCM will give patient brochure for facility.  He will be able to utilize the [facility] clinic for med assistance but not for narcotics. NCM gave patient brochure for the [facility] clinic also. Patient states he needs a bus pass, NCM notified [place] CSW , she will bring patient a bus pass.                   Action/Plan: NCM will follow for dc needs.  Expected Discharge Date:  10/09/16               Expected Discharge Plan:  Home/Self Care  In-House Referral:     Discharge planning Services  CM Consult, Indigent Health Clinic, Medication Assistance, Follow-up appt scheduled  Post Acute Care Choice:    Choice offered to:     DME Arranged:    DME Agency:     HH Arranged:    HH Agency:     Status of Service:  Completed, signed off  If discussed at [employer], dates discussed:    Additional Comments:  Leone Haven, RN 10/09/2016, 2:14 PM

## 2016-10-09 NOTE — Progress Notes (Signed)
CSW received consult regarding substance use. CSW spoke with patient. He reports that he would like to stop drinking so much. He states that he drinks about 4 days a week, before and after work. He has been to several inpatient and outpatient rehabilitation programs before but always starts drinking again. CSW provided list of substance use facilities. He states that he does not want to go to a residential program, but will try some of the outpatient programs. He states he lives with his niece and nephew who are supportive of him not drinking. CSW also provided bus pass. Patient reported no other concerns.   CSW signing off.  Osborne Casco Khylah Kendra LCSWA [phone]

## 2016-10-09 NOTE — Progress Notes (Signed)
   Subjective:  Patient continues to complain of headaches, which are relieved somewhat with PRN tylenol and ibuprofen. He has not noticed any focal weakness or numbness. He continues to endorse wanting to quit drinking alcohol and requests more information about community resources for to help with this.  Objective:  Vital signs in last 24 hours: Vitals:   10/09/16 0400 10/09/16 0500 10/09/16 0600 10/09/16 0742  BP: 136/82 106/65 (!) 144/88 (!) 143/93  Pulse: (!) 52 (!) 54 (!) 58 (!) 59  Resp: Temp: 98.1 F (36.7 C)   98.1 F (36.7 C)  TempSrc: Oral   Oral  SpO2: 99% 99% 100% 100%  Weight:      Height:       Physical Exam  Constitutional: He is oriented to person, place, and time. He appears well-developed and well-nourished.  Eyes: Conjunctivae and EOM are normal.  Cardiovascular: Normal rate and regular rhythm.  Exam reveals no friction rub.   No murmur heard. Pulmonary/Chest: Effort normal. No respiratory distress. He has no wheezes. He has no rales.  Abdominal: Soft. He exhibits no distension. There is no tenderness. There is no guarding.  Neurological: He is alert and oriented to person, place, and time.  Face strength and sensation intact bilaterally. Tongue midline. Gross motor and sensation to light touch of upper and lower extremities intact bilaterally.  Skin: Skin is warm and dry. Capillary refill takes less than 2 seconds. No rash noted. No erythema. No pallor.   Assessment/Plan:  Active Problems:   Alcohol abuse with intoxication, uncomplicated (HCC)   Fall   Subdural hematoma (HCC)  Estle Huguley is a 56 yo with a PMH of HTN, depression, and alcohol abuse who presented to the [facility] ED on 10/07/16 who presented for evaluation after a fall while intoxicated. He was found to have an acute right temporoparietal subdural hematoma measuring 6 mm in greatest dimension without evidence of midline shift or neurologic defects. The patient was admitted to the  internal medicine teaching service for management with neurosurgery consulting. The specific problems addressed during his admission were as follows:  Acute right temporoparietal subdural hematoma: No current neurological defects and patient has been stable since admission. Neurosurgery signed off and state he is stable for discharge today.  -Neuro checks q2 hour -Continue Keppra 500 mg BID for seizure prophylaxis (1 week of total therapy, will be discharged with 5 days) -Ibuprofen 800 mg PRN and/or Tylenol 650 mg PRN for headache  Alcohol use disorder: Case management and social work have been consulted for medication management and  -Social work and case management consulted, appreciate recommendations -CIWA protocol while inpatient  Hypertension: BP around 1402/70s while on home medication -Lisinopril 20 mg daily  Fluids/Electrolytes: NS at rate of 100 ml/hr, thiamine and folic acid on admission, daily multivitamin while inpatient Diet: Regular DVT prophylaxis: SCDs while in bed, ambulate as able with PT  Dispo: Anticipated discharge today.   Rozann Lesches, MD 10/09/2016, 54:33 AM Pager: [phone]

## 2016-10-09 NOTE — Progress Notes (Signed)
If patient remains neuro intact and medically stable, he can be discharged from a NS standpoint.   Complete 7 day Keppra course. F/U outpt 4 weeks. Please call for any concerns.

## 2016-10-09 NOTE — Discharge Instructions (Signed)
Alcohol Withdrawal °When a person who drinks a lot of alcohol stops drinking, he or she may go through alcohol withdrawal. Alcohol withdrawal causes problems. It can make you feel: °· Tired (fatigued). °· Sad (depressed). °· Fearful (anxious). °· Grouchy (irritable). °· Not hungry. °· Sick to your stomach (nauseous). °· Shaky. ° °It can also make you have: °· Nightmares. °· Trouble sleeping. °· Trouble thinking clearly. °· Mood swings. °· Clammy skin. °· Very bad sweating. °· A very fast heartbeat. °· Shaking that you cannot control (tremor). °· Having a fever. °· A fit of movements that you cannot control (seizure). °· Confusion. °· Throwing up (vomiting). °· Feeling or seeing things that are not there (hallucinations). ° °Follow these instructions at home: °· Take medicines and vitamins only as told by your doctor. °· Do not drink alcohol. °· Have someone around in case you need help. °· Drink enough fluid to keep your pee (urine) clear or pale yellow. °· Think about joining a group to help you stop drinking. °Contact a doctor if: °· Your problems get worse. °· Your problems do not go away. °· You cannot eat or drink without throwing up. °· You are having a hard time not drinking alcohol. °· You cannot stop drinking alcohol. °Get help right away if: °· You feel your heart beating differently than usual. °· Your chest hurts. °· You have trouble breathing. °· You have very bad problems, like: °? A fever. °? A fit of movements that you cannot control. °? Being very confused. °? Feeling or seeing things that are not there. °This information is not intended to replace advice given to you by your health care provider. Make sure you discuss any questions you have with your health care provider. °Document Released: 06/13/2007 Document Revised: 06/02/2015 Document Reviewed: 10/13/2013 °Elsevier Interactive Patient Education © 2018 Elsevier Inc[employer] ° ° °Alcohol Use Disorder °Alcohol use disorder is when your drinking disrupts  your daily life. When you have this condition, you drink too much alcohol and you cannot control your drinking. °Alcohol use disorder can cause serious problems with your physical health. It can affect your brain, heart, liver, pancreas, immune system, stomach, and intestines. Alcohol use disorder can increase your risk for certain cancers and cause problems with your mental health, such as depression, anxiety, psychosis, delirium, and dementia. People with this disorder risk hurting themselves and others. °What are the causes? °This condition is caused by drinking too much alcohol over time. It is not caused by drinking too much alcohol only one or two times. Some people with this condition drink alcohol to cope with or escape from negative life events. Others drink to relieve pain or symptoms of mental illness. °What increases the risk? °You are more likely to develop this condition if: °· You have a family history of alcohol use disorder. °· Your culture encourages drinking to the point of intoxication, or makes alcohol easy to get. °· You had a mood or conduct disorder in childhood. °· You have been a victim of abuse. °· You are an adolescent and: °? You have poor grades or difficulties in school. °? Your caregivers do not talk to you about saying no to alcohol, or supervise your activities. °? You are impulsive or you have trouble with self-control. ° °What are the signs or symptoms? °Symptoms of this condition include: °· Drinking more than you want to. °· Drinking for longer than you want to. °· Trying several times to drink less or to control your   drinking. °· Spending a lot of time getting alcohol, drinking, or recovering from drinking. °· Craving alcohol. °· Having problems at work, at school, or at home due to drinking. °· Having problems in relationships due to drinking. °· Drinking when it is dangerous to drink, such as before driving a car. °· Continuing to drink even though you know you might have a  physical or mental problem related to drinking. °· Needing more and more alcohol to get the same effect you want from the alcohol (building up tolerance). °· Having symptoms of withdrawal when you stop drinking. Symptoms of withdrawal include: °? Fatigue. °? Nightmares. °? Trouble sleeping. °? Depression. °? Anxiety. °? Fever. °? Seizures. °? Severe confusion. °? Feeling or seeing things that are not there (hallucinations). °? Tremors. °? Rapid heart rate. °? Rapid breathing. °? High blood pressure. °· Drinking to avoid symptoms of withdrawal. ° °How is this diagnosed? °This condition is diagnosed with an assessment. Your health care provider may start the assessment by asking three or four questions about your drinking. °Your health care provider may perform a physical exam or do lab tests to see if you have physical problems resulting from alcohol use. She or he may refer you to a mental health professional for evaluation. °How is this treated? °Some people with alcohol use disorder are able to reduce their alcohol use to low-risk levels. Others need to completely quit drinking alcohol. When necessary, mental health professionals with specialized training in substance use treatment can help. Your health care provider can help you decide how severe your alcohol use disorder is and what type of treatment you need. The following forms of treatment are available: °· Detoxification. Detoxification involves quitting drinking and using prescription medicines within the first week to help lessen withdrawal symptoms. This treatment is important for people who have had withdrawal symptoms before and for heavy drinkers who are likely to have withdrawal symptoms. Alcohol withdrawal can be dangerous, and in severe cases, it can cause death. Detoxification may be provided in a home, community, or primary care setting, or in a hospital or substance use treatment facility. °· Counseling. This treatment is also called talk  therapy. It is provided by substance use treatment counselors. A counselor can address the reasons you use alcohol and suggest ways to keep you from drinking again or to prevent problem drinking. The goals of talk therapy are to: °? Find healthy activities and ways for you to cope with stress. °? Identify and avoid the things that trigger your alcohol use. °? Help you learn how to handle cravings. °· Medicines. Medicines can help treat alcohol use disorder by: °? Decreasing alcohol cravings. °? Decreasing the positive feeling you have when you drink alcohol. °? Causing an uncomfortable physical reaction when you drink alcohol (aversion therapy). °· Support groups. Support groups are led by people who have quit drinking. They provide emotional support, advice, and guidance. ° °These forms of treatment are often combined. Some people with this condition benefit from a combination of treatments provided by specialized substance use treatment centers. °Follow these instructions at home: °· Take over-the-counter and prescription medicines only as told by your health care provider. °· Check with your health care provider before starting any new medicines. °· Ask friends and family members not to offer you alcohol. °· Avoid situations where alcohol is served, including gatherings where others are drinking alcohol. °· Create a plan for what to do when you are tempted to use alcohol. °· Find hobbies or   activities that you enjoy that do not include alcohol. °· Keep all follow-up visits as told by your health care provider. This is important. °How is this prevented? °· If you drink, limit alcohol intake to no more than 1 drink a day for nonpregnant women and 2 drinks a day for men. One drink equals 12 oz of beer, 5 oz of wine, or 1½ oz of hard liquor. °· If you have a mental health condition, get treatment and support. °· Do not give alcohol to adolescents. °· If you are an adolescent: °? Do not drink alcohol. °? Do not be  afraid to say no if someone offers you alcohol. Speak up about why you do not want to drink. You can be a positive role model for your friends and set a good example for those around you by not drinking alcohol. °? If your friends drink, spend time with others who do not drink alcohol. Make new friends who do not use alcohol. °? Find healthy ways to manage stress and emotions, such as meditation or deep breathing, exercise, spending time in nature, listening to music, or talking with a trusted friend or family member. °Contact a health care provider if: °· You are not able to take your medicines as told. °· Your symptoms get worse. °· You return to drinking alcohol (relapse) and your symptoms get worse. °Get help right away if: °· You have thoughts about hurting yourself or others. °If you ever feel like you may hurt yourself or others, or have thoughts about taking your own life, get help right away. You can go to your nearest emergency department or call: °· Your local emergency services (911 in the [place] °· A suicide crisis helpline, such as the National Suicide Prevention Lifeline at 1-800-273-8255. This is open 24 hours a day. ° °Summary °· Alcohol use disorder is when your drinking disrupts your daily life. When you have this condition, you drink too much alcohol and you cannot control your drinking. °· Treatment may include detoxification, counseling, medicine, and support groups. °· Ask friends and family members not to offer you alcohol. Avoid situations where alcohol is served. °· Get help right away if you have thoughts about hurting yourself or others. °This information is not intended to replace advice given to you by your health care provider. Make sure you discuss any questions you have with your health care provider. °Document Released: 02/02/2004 Document Revised: 09/22/2015 Document Reviewed: 09/22/2015 °Elsevier Interactive Patient Education © 2018 Elsevier Inc. ° °

## 2016-10-13 ENCOUNTER — Emergency Department (HOSPITAL_COMMUNITY)
Admission: EM | Admit: 2016-10-13 | Discharge: 2016-10-13 | Disposition: A | Discharge status: Home / Self Care | Payer: Self-pay | Attending: Emergency Medicine | Admitting: Emergency Medicine

## 2016-10-13 ENCOUNTER — Emergency Department (HOSPITAL_COMMUNITY): Payer: Self-pay

## 2016-10-13 ENCOUNTER — Encounter (HOSPITAL_COMMUNITY): Payer: Self-pay | Admitting: Emergency Medicine

## 2016-10-13 DIAGNOSIS — F10120 Alcohol abuse with intoxication, uncomplicated: Secondary | ICD-10-CM | POA: Insufficient documentation

## 2016-10-13 DIAGNOSIS — Y999 Unspecified external cause status: Secondary | ICD-10-CM | POA: Insufficient documentation

## 2016-10-13 DIAGNOSIS — W0110XA Fall on same level from slipping, tripping and stumbling with subsequent striking against unspecified object, initial encounter: Secondary | ICD-10-CM | POA: Insufficient documentation

## 2016-10-13 DIAGNOSIS — I10 Essential (primary) hypertension: Secondary | ICD-10-CM | POA: Insufficient documentation

## 2016-10-13 DIAGNOSIS — M545 Low back pain: Secondary | ICD-10-CM | POA: Insufficient documentation

## 2016-10-13 DIAGNOSIS — Y929 Unspecified place or not applicable: Secondary | ICD-10-CM | POA: Insufficient documentation

## 2016-10-13 DIAGNOSIS — S0990XA Unspecified injury of head, initial encounter: Secondary | ICD-10-CM | POA: Insufficient documentation

## 2016-10-13 DIAGNOSIS — Y939 Activity, unspecified: Secondary | ICD-10-CM | POA: Insufficient documentation

## 2016-10-13 DIAGNOSIS — Y908 Blood alcohol level of 240 mg/100 ml or more: Secondary | ICD-10-CM | POA: Insufficient documentation

## 2016-10-13 DIAGNOSIS — W19XXXA Unspecified fall, initial encounter: Secondary | ICD-10-CM

## 2016-10-13 DIAGNOSIS — F10920 Alcohol use, unspecified with intoxication, uncomplicated: Secondary | ICD-10-CM

## 2016-10-13 DIAGNOSIS — R55 Syncope and collapse: Secondary | ICD-10-CM | POA: Insufficient documentation

## 2016-10-13 DIAGNOSIS — F17210 Nicotine dependence, cigarettes, uncomplicated: Secondary | ICD-10-CM | POA: Insufficient documentation

## 2016-10-13 DIAGNOSIS — R51 Headache: Secondary | ICD-10-CM | POA: Insufficient documentation

## 2016-10-13 DIAGNOSIS — Z79899 Other long term (current) drug therapy: Secondary | ICD-10-CM | POA: Insufficient documentation

## 2016-10-13 LAB — CBC WITH DIFFERENTIAL/PLATELET
Basophils Absolute: 0 10*3/uL (ref 0.0–0.1)
Basophils Relative: 0 %
Eosinophils Absolute: 0.1 10*3/uL (ref 0.0–0.7)
Eosinophils Relative: 2 %
HCT: 39.4 % (ref 39.0–52.0)
Hemoglobin: 13.6 g/dL (ref 13.0–17.0)
Lymphocytes Relative: 25 %
Lymphs Abs: 1.4 10*3/uL (ref 0.7–4.0)
MCH: 30 pg (ref 26.0–34.0)
MCHC: 34.5 g/dL (ref 30.0–36.0)
MCV: 86.8 fL (ref 78.0–100.0)
Monocytes Absolute: 0.3 10*3/uL (ref 0.1–1.0)
Monocytes Relative: 5 %
Neutro Abs: 3.8 10*3/uL (ref 1.7–7.7)
Neutrophils Relative %: 68 %
Platelets: 282 10*3/uL (ref 150–400)
RBC: 4.54 MIL/uL (ref 4.22–5.81)
RDW: 16.3 % — ABNORMAL HIGH (ref 11.5–15.5)
WBC: 5.5 10*3/uL (ref 4.0–10.5)

## 2016-10-13 LAB — BASIC METABOLIC PANEL
Anion gap: 14 (ref 5–15)
BUN: 6 mg/dL (ref 6–20)
CO2: 24 mmol/L (ref 22–32)
Calcium: 8.7 mg/dL — ABNORMAL LOW (ref 8.9–10.3)
Chloride: 105 mmol/L (ref 101–111)
Creatinine, Ser: 0.95 mg/dL (ref 0.61–1.24)
GFR calc Af Amer: >60 mL/min (ref >60)
GFR calc non Af Amer: >60 mL/min (ref >60)
Glucose, Bld: 86 mg/dL (ref 65–99)
Potassium: 4 mmol/L (ref 3.5–5.1)
Sodium: 143 mmol/L (ref 135–145)

## 2016-10-13 LAB — ETHANOL: Alcohol, Ethyl (B): 330 mg/dL (ref <10)

## 2016-10-13 MED ORDER — SODIUM CHLORIDE 0.9 % IV BOLUS (SEPSIS)
1000.0000 mL | Freq: Once | INTRAVENOUS | Status: AC
  Administered 2016-10-13: 1000 mL via INTRAVENOUS

## 2016-10-13 NOTE — ED Notes (Signed)
Pt transported to CT will collect labs when pt returns

## 2016-10-13 NOTE — Discharge Instructions (Signed)
Follow-up with [facility] on October 16 as scheduled. Return to the ED if any concerning signs or symptoms develop.

## 2016-10-13 NOTE — ED Provider Notes (Signed)
Care assumed from [facility], [place]. Plan is to ambulate patient for safe discharge. Pt presenting w acute alcohol intoxication. Pt w PMHx EtOH abuse, HTN, CHF, anemia, presenting after reportedly falling and hitting his head with LOC PTA. Recent dx of subdural hematoma on 10/07/16. CT head during this visit showing stable and unchanged bleed. EtOH level during this visit is 330. Pt tolerating PO.  Physical Exam  BP 112/71 (BP Location: Left Arm)   Pulse 87   Temp 98.1 F (36.7 C) (Oral)   Resp 18   SpO2 98%   Physical Exam  Constitutional: He is oriented to person, place, and time. He appears well-developed and well-nourished.  HENT:  Head: Normocephalic and atraumatic.  Eyes: Conjunctivae are normal.  Cardiovascular: Normal rate.   Pulmonary/Chest: Effort normal.  Neurological: He is alert and oriented to person, place, and time.  Psychiatric: He has a normal mood and affect. His behavior is normal.  Nursing note and vitals reviewed.   ED Course  Procedures  Clinical Course as of Oct 14 1338  Sat Oct 13, 2016  0901 Pt ambulated, however still unsteady on feet. Will provide oral rehydration, food, and re-evaluate.  [JR]    Clinical Course User Index [JR] Russo, [place] N, PA-C     Patient ambulating in ED and tolerating PO prior to discharge. Pt remains alert and oriented. States he is sober and ready to go home. PCP appointment set up by Luevenia Maxin, PA-C. Return precautions discussed.  Discussed results, findings, treatment and follow up. Patient advised of return precautions. Patient verbalized understanding and agreed with plan.       Russo, [place] N, PA-C 10/13/16 1411    Pricilla Loveless, MD 10/18/16 [phone]
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
## 2016-10-13 NOTE — ED Notes (Signed)
Pt. Stood to ambulate but could not walk for being stagger. Nurse aware.

## 2016-10-13 NOTE — ED Notes (Signed)
Attempted t walk pt, pt unable to walk at this time
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
## 2016-10-16 ENCOUNTER — Encounter (HOSPITAL_COMMUNITY): Payer: Self-pay | Admitting: Emergency Medicine

## 2016-10-16 ENCOUNTER — Emergency Department (HOSPITAL_COMMUNITY)
Admission: EM | Admit: 2016-10-16 | Discharge: 2016-10-16 | Disposition: A | Discharge status: Home / Self Care | Payer: Self-pay | Attending: Emergency Medicine | Admitting: Emergency Medicine

## 2016-10-16 DIAGNOSIS — F1910 Other psychoactive substance abuse, uncomplicated: Secondary | ICD-10-CM

## 2016-10-16 DIAGNOSIS — Z599 Problem related to housing and economic circumstances, unspecified: Secondary | ICD-10-CM

## 2016-10-16 DIAGNOSIS — Y908 Blood alcohol level of 240 mg/100 ml or more: Secondary | ICD-10-CM | POA: Insufficient documentation

## 2016-10-16 DIAGNOSIS — F1014 Alcohol abuse with alcohol-induced mood disorder: Secondary | ICD-10-CM

## 2016-10-16 DIAGNOSIS — F10988 Alcohol use, unspecified with other alcohol-induced disorder: Secondary | ICD-10-CM | POA: Insufficient documentation

## 2016-10-16 DIAGNOSIS — R45851 Suicidal ideations: Secondary | ICD-10-CM | POA: Insufficient documentation

## 2016-10-16 DIAGNOSIS — R45850 Homicidal ideations: Secondary | ICD-10-CM | POA: Insufficient documentation

## 2016-10-16 DIAGNOSIS — R4587 Impulsiveness: Secondary | ICD-10-CM

## 2016-10-16 DIAGNOSIS — R440 Auditory hallucinations: Secondary | ICD-10-CM | POA: Insufficient documentation

## 2016-10-16 DIAGNOSIS — I10 Essential (primary) hypertension: Secondary | ICD-10-CM | POA: Insufficient documentation

## 2016-10-16 DIAGNOSIS — F17210 Nicotine dependence, cigarettes, uncomplicated: Secondary | ICD-10-CM | POA: Insufficient documentation

## 2016-10-16 DIAGNOSIS — Z560 Unemployment, unspecified: Secondary | ICD-10-CM

## 2016-10-16 DIAGNOSIS — R441 Visual hallucinations: Secondary | ICD-10-CM | POA: Insufficient documentation

## 2016-10-16 DIAGNOSIS — F10120 Alcohol abuse with intoxication, uncomplicated: Secondary | ICD-10-CM

## 2016-10-16 LAB — CBC
HCT: 43.3 % (ref 39.0–52.0)
HEMOGLOBIN: 15.1 g/dL (ref 13.0–17.0)
MCH: 30.3 pg (ref 26.0–34.0)
MCHC: 34.9 g/dL (ref 30.0–36.0)
MCV: 86.8 fL (ref 78.0–100.0)
Platelets: 358 10*3/uL (ref 150–400)
RBC: 4.99 MIL/uL (ref 4.22–5.81)
RDW: 16.5 % — AB (ref 11.5–15.5)
WBC: 5.2 10*3/uL (ref 4.0–10.5)

## 2016-10-16 LAB — COMPREHENSIVE METABOLIC PANEL
ALBUMIN: 4.3 g/dL (ref 3.5–5.0)
ALT: 24 U/L (ref 17–63)
ANION GAP: 17 — AB (ref 5–15)
AST: 45 U/L — ABNORMAL HIGH (ref 15–41)
Alkaline Phosphatase: 113 U/L (ref 38–126)
BUN: 5 mg/dL — ABNORMAL LOW (ref 6–20)
CHLORIDE: 104 mmol/L (ref 101–111)
CO2: 22 mmol/L (ref 22–32)
Calcium: 9 mg/dL (ref 8.9–10.3)
Creatinine, Ser: 0.83 mg/dL (ref 0.61–1.24)
GFR calc non Af Amer: >60 mL/min (ref >60)
Glucose, Bld: 90 mg/dL (ref 65–99)
POTASSIUM: 3.9 mmol/L (ref 3.5–5.1)
SODIUM: 143 mmol/L (ref 135–145)
Total Bilirubin: 0.7 mg/dL (ref 0.3–1.2)
Total Protein: 8.3 g/dL — ABNORMAL HIGH (ref 6.5–8.1)

## 2016-10-16 LAB — RAPID URINE DRUG SCREEN, HOSP PERFORMED
Amphetamines: NOT DETECTED
BARBITURATES: NOT DETECTED
Benzodiazepines: NOT DETECTED
COCAINE: NOT DETECTED
OPIATES: NOT DETECTED
Tetrahydrocannabinol: NOT DETECTED

## 2016-10-16 LAB — SALICYLATE LEVEL

## 2016-10-16 LAB — ACETAMINOPHEN LEVEL

## 2016-10-16 LAB — ETHANOL: Alcohol, Ethyl (B): 373 mg/dL (ref <10)

## 2016-10-16 MED ORDER — VITAMIN B-1 100 MG PO TABS
100.0000 mg | ORAL_TABLET | Freq: Every day | ORAL | Status: DC
  Administered 2016-10-16: 100 mg via ORAL
  Filled 2016-10-16: qty 1

## 2016-10-16 MED ORDER — LORAZEPAM 1 MG PO TABS: 1.0000 mg | ORAL_TABLET | Freq: Four times a day (QID) | ORAL | Status: DC | PRN

## 2016-10-16 MED ORDER — LORAZEPAM 1 MG PO TABS: 0.0000 mg | ORAL_TABLET | Freq: Two times a day (BID) | ORAL | Status: DC

## 2016-10-16 MED ORDER — LORAZEPAM 2 MG/ML IJ SOLN
0.0000 mg | Freq: Two times a day (BID) | INTRAMUSCULAR | Status: DC
Start: 2016-10-18 — End: 2016-10-16

## 2016-10-16 MED ORDER — NICOTINE 21 MG/24HR TD PT24: 21.0000 mg | MEDICATED_PATCH | Freq: Every day | TRANSDERMAL | Status: DC

## 2016-10-16 MED ORDER — LORAZEPAM 2 MG/ML IJ SOLN: 0.0000 mg | Freq: Four times a day (QID) | INTRAMUSCULAR | Status: DC

## 2016-10-16 MED ORDER — GABAPENTIN 300 MG PO CAPS
300.0000 mg | ORAL_CAPSULE | Freq: Two times a day (BID) | ORAL | Status: DC
  Administered 2016-10-16: 300 mg via ORAL
  Filled 2016-10-16: qty 1

## 2016-10-16 MED ORDER — LORAZEPAM 1 MG PO TABS
0.0000 mg | ORAL_TABLET | Freq: Four times a day (QID) | ORAL | Status: DC
Start: 2016-10-16 — End: 2016-10-16
  Administered 2016-10-16: 2 mg via ORAL
  Filled 2016-10-16: qty 2

## 2016-10-16 MED ORDER — THIAMINE HCL 100 MG/ML IJ SOLN: 100.0000 mg | Freq: Every day | INTRAMUSCULAR | Status: DC

## 2016-10-16 NOTE — ED Notes (Signed)
Pt admitted to room #41. Pt uninterested, forwards little with this nurse. Pt reports he is wanting rehab for alcohol abuse. Encouragement and support provided. Special checks q 15 mins in place for safety, Video monitoring in place. Will continue to monitor.
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
## 2016-10-16 NOTE — Discharge Instructions (Signed)
To help you maintain a sober lifestyle, a substance abuse treatment program may be beneficial to you.  Contact [facility] at your earliest opportunity to ask about enrolling in their program: ° °     [facility] °     [address] °     [place], [place] [address] °     [phone] °     New patients are seen at the walk-in clinic every Tuesday from 9:00 am - 12:00 pm °

## 2016-10-16 NOTE — Patient Outreach (Signed)
ED Peer Support Specialist Patient Intake (Complete at intake & 30-60 Day Follow-up)  Name: Rodney Hartman  MRN: [id number]  Age: 56 y.o.   Date of Admission: 10/16/2016  Intake: Initial Comments:      Primary Reason Admitted: Patient report plans to shot self in the head. Report has access to gun. Pt. Intoxicated during the assessment falling asleep while writer attempted to access. Pt. Would awake answer questions then fall back asleep. Report trigger to thoughts includes being bored and life stress. Patient report visual and auditory hallucinations did not elaborate. Patient denies HI. Patient report only will hurt someone if they get into his way. Patient had no plan to specifically hurt others. Patient report being depressed for two months. Patient report drinking alcohol for 15 years. Report lives with nephew and able to return. Denies medication management services.   Gathering information limited due to patient going in and out of conscious.   Disposition: Patient meet criteria for discharge. Discharge with outpatient referral. Discharge with peer support   Lab values: Alcohol/ETOH: Positive Positive UDS? No Amphetamines: No Barbiturates: No Benzodiazepines: No Cocaine: No Opiates: No Cannabinoids: No  Demographic information: Gender: Male Ethnicity: African American Marital Status: Separated Insurance Status:   Control and instrumentation engineer (Work Engineer, agricultural, Sales executive, etc.: No Lives with: Alone Living situation: Homeless shelter  Reported Patient History: Patient reported health conditions: Depression, Anxiety disorders Patient aware of HIV and hepatitis status: No  In past year, has patient visited ED for any reason? Yes  Number of ED visits: 4  Reason(s) for visit: drinking   In past year, has patient been hospitalized for any reason? Yes  Number of hospitalizations: 4  Reason(s) for hospitalization: drinking  In past year, has patient  been arrested? Yes  Number of arrests: 1  Reason(s) for arrest: verbal abuse  In past year, has patient been incarcerated? Yes  Number of incarcerations: 1  Reason(s) for incarceration: verbal abuse  In past year, has patient received medication-assisted treatment? No  In past year, patient received the following treatments: Other (comment) ([facility])  In past year, has patient received any harm reduction services? No  Did this include any of the following?    In past year, has patient received care from a mental health provider for diagnosis other than SUD? No  In past year, is this first time patient has overdosed?    Number of past overdoses:    In past year, is this first time patient has been hospitalized for an overdose? No  Number of hospitalizations for overdose(s):    Is patient currently receiving treatment for a mental health diagnosis? No  Patient reports experiencing difficulty participating in SUD treatment: No    Most important reason(s) for this difficulty?    Has patient received prior services for treatment? No  In past, patient has received services from following agencies:    Plan of Care:  Suggested follow up at these agencies/treatment centers: [facility]), [facility]), [facility])  Other information: CPSS Arlys Jalise Zawistowski and Makeya Hilgert processed with Pt about the services that are available for Pt and also for Pt to follow up with the [facility] services. CPSS Arlys Bassam Dresch addressed the fact that Pt maybe able to get help from [facility]. CPSS Arlys Marlean Mortell and Jonny Ruiz issued contact cards for Pt to follow up with [facility]PSS.    Bartholomew Boards, CPSS  10/16/2016 12:21 PM

## 2016-10-16 NOTE — ED Notes (Signed)
Bed: WTR5 Expected date:  Expected time:  Means of arrival:  Comments: 

## 2016-10-16 NOTE — BH Assessment (Signed)
Tele Assessment Note   Patient Name: Rodney Hartman MRN: [id number] Referring Physician: Elpidio Anis, PA-C Location of Patient: [facility] Location of Provider: Other: Ithiel Liebler is an 56 y.o. male present to [facility] with complaints of suicidal ideations. Patient report plans to shot self in the head. Report has access to gun. Pt. Intoxicated during the assessment falling asleep while writer attempted to access. Pt. Would awake answer questions then fall back asleep. Report trigger to thoughts includes being bored and life stress. Patient report visual and auditory hallucinations did not elaborate. Patient denies HI. Patient report only will hurt someone if they get into his way. Patient had no plan to specifically hurt others. Patient report being depressed for two months. Patient report drinking alcohol for 15 years. Report lives with nephew and able to return. Denies medication management services.   Gathering information limited due to patient going in and out of conscious.   Disposition: Patient meet criteria for discharge. Discharge with outpatient referral. Discharge with peer support services.   Diagnosis:Alcohol-induced depressive disorder, With moderate or severe use disorder;  Alcohol intoxication, With moderate or severe use disorder  Past Medical History:  Past Medical History:  Diagnosis Date  . Alcohol abuse   . Depression   . Hypertension   . PUD (peptic ulcer disease)     Past Surgical History:  Procedure Laterality Date  . BACK SURGERY    . 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   . TONSILLECTOMY      Family History:  Family History  Problem Relation Age of Onset  . Heart disease Mother     Social History:  reports that he has been smoking Cigarettes.  He has been smoking about 0.50 packs per day. He has never used smokeless tobacco. He reports that he drinks  about 3.6 oz of alcohol per week . He reports that he does not use drugs.  Additional Social History:  Alcohol / Drug Use Pain Medications: see MAR Prescriptions: see MAR Over the Counter: see MAR  CIWA: CIWA-Ar BP: (!) 137/101 Pulse Rate: 91 Nausea and Vomiting: no nausea and no vomiting Tactile Disturbances: none Tremor: no tremor Auditory Disturbances: not present Paroxysmal Sweats: barely perceptible sweating, palms moist Visual Disturbances: not present Anxiety: five Headache, Fullness in Head: none present Agitation: six Orientation and Clouding of Sensorium: oriented and can do serial additions CIWA-Ar Total: 12 COWS:    PATIENT STRENGTHS: (choose at least two) Average or above average intelligence Supportive family/friends  Allergies: No Known Allergies  Home Medications:  (Not in a hospital admission)  OB/GYN Status:  No LMP for male patient.  General Assessment Data Location of Assessment: [facility] ED TTS Assessment: In system Is this a Tele or Face-to-Face Assessment?: Face-to-Face Is this an Initial Assessment or a Re-assessment for this encounter?: Initial Assessment Living Arrangements: Other (Comment) (report lives with nephew) Can pt return to current living arrangement?: Yes Admission Status: Voluntary Is patient capable of signing voluntary admission?: Yes Referral Source: Other Event organiser ) Insurance type: none     Crisis Care Plan Living Arrangements: Other (Comment) (report lives with nephew) Name of Psychiatrist: none reported Name of Therapist: none reported     Risk to self with the past 6 months Suicidal Ideation: Yes-Currently Present Has patient been a risk to self within the past 6 months prior  to admission? : Yes Suicidal Intent: No Has patient had any suicidal intent within the past 6 months prior to admission? : No Is patient at risk for suicide?: No Suicidal Plan?: Yes-Currently Present (Shot self in head) Has  patient had any suicidal plan within the past 6 months prior to admission? : No Specify Current Suicidal Plan: shot self in the head Access to Means: Yes Specify Access to Suicidal Means: has access to gun What has been your use of drugs/alcohol within the last 12 months?: alcohol  Previous Attempts/Gestures: No How many times?: 0 Other Self Harm Risks: none reported Intentional Self Injurious Behavior: None Family Suicide History: Unknown Recent stressful life event(s): Other (Comment) Persecutory voices/beliefs?: No Depression: Yes Depression Symptoms: Feeling worthless/self pity Substance abuse history and/or treatment for substance abuse?: Yes  Risk to Others within the past 6 months Homicidal Ideation: No Does patient have any lifetime risk of violence toward others beyond the six months prior to admission? : No Thoughts of Harm to Others: Yes-Currently Present (only if others get into his way, attmept to stop him ) Comment - Thoughts of Harm to Others: non specific (report if others get into his way) Current Homicidal Intent: No Current Homicidal Plan: No Access to Homicidal Means: Yes Describe Access to Homicidal Means: access to gun Identified Victim: non specific History of harm to others?: No Assessment of Violence: None Noted Violent Behavior Description: none noted Does patient have access to weapons?: Yes (Comment) (access to gun ) Criminal Charges Pending?: No Does patient have a court date: No Is patient on probation?: Unknown  Psychosis Hallucinations: Auditory (pt not specific ) Delusions: Unspecified (pt not specific )  Mental Status Report Appearance/Hygiene: In hospital gown Eye Contact: Poor Motor Activity: Unsteady Speech: Slurred Level of Consciousness: Drowsy Mood: Depressed Affect: Depressed Anxiety Level: Minimal Thought Processes: Unable to Assess Judgement: Impaired (pt under influene of alcohol, SI thoughts, hallucinations ) Orientation:  Person, Place, Time, Situation  Cognitive Functioning Concentration: Poor Memory: Recent Impaired, Remote Impaired IQ: Average Insight: Poor Impulse Control: Poor Appetite: Poor Sleep: Decreased  ADLScreening [facility] Services) Patient's cognitive ability adequate to safely complete daily activities?: Yes Patient able to express need for assistance with ADLs?: Yes Independently performs ADLs?: Yes (appropriate for developmental age)  Prior Inpatient Therapy Prior Inpatient Therapy: No  Prior Outpatient Therapy Prior Outpatient Therapy: No Does patient have an ACCT team?: No Does patient have Intensive In-House Services?  : No Does patient have [facility] services? : No Does patient have [facility] services?: No  ADL Screening (condition at time of admission) Patient's cognitive ability adequate to safely complete daily activities?: Yes Is the patient deaf or have difficulty hearing?: No Does the patient have difficulty seeing, even when wearing glasses/contacts?: No Does the patient have difficulty concentrating, remembering, or making decisions?: No Patient able to express need for assistance with ADLs?: Yes Does the patient have difficulty dressing or bathing?: No Independently performs ADLs?: Yes (appropriate for developmental age)       Abuse/Neglect Assessment (Assessment to be complete while patient is alone) Physical Abuse: Denies Verbal Abuse: Denies Sexual Abuse: Denies Exploitation of patient/patient's resources: Denies Self-Neglect: Denies Values / Beliefs Cultural Requests During Hospitalization: None Spiritual Requests During Hospitalization: None   Advance Directives (For Healthcare) Does Patient Have a Medical Advance Directive?: No    Additional Information 1:1 In Past 12 Months?: No CIRT Risk: No Elopement Risk: No Does patient have medical clearance?: No     Disposition: Patient meet  criteria for discharge. Discharge with outpatient  referral. Discharge with peer support services.  Disposition Initial Assessment Completed for this Encounter: Yes Disposition of Patient: Discharge with Outpatient Resources (Patient discharged with referral for out patient)   Dian Situ 10/16/2016 10:54 AM

## 2016-10-16 NOTE — ED Notes (Signed)
Pt d/c home per MD order. Discharge summary reviewed with pt. Pt not endorsing SI/HI/AVH. Personal property returned to pt. Pt signed e-signature. Ambulatory off unit with [facility].

## 2016-10-16 NOTE — BH Assessment (Signed)
[facility] Assessment Progress Note  Per Thedore Mins, MD, this pt does not require psychiatric hospitalization at this time.  Pt is to be discharged from [facility] with recommendation to follow up with [facility].  This has been included in pt's discharge instructions.  Pt's nurse, Morrie Sheldon, has been notified.  Doylene Canning, MA Triage Specialist [phone]

## 2016-10-16 NOTE — [facility] Suicide Risk Assessment (Signed)
Suicide Risk Assessment  Discharge Assessment   [facility] Discharge Suicide Risk Assessment   Principal Problem: Alcohol abuse with alcohol-induced mood disorder [facility]) Discharge Diagnoses:  Patient Active Problem List   Diagnosis Date Noted  . Fall [W19.XXXA] 10/07/2016  . Subdural hematoma (HCC) [S06.5X9A] 10/07/2016  . Atypical chest pain [R07.89]   . Acute congestive heart failure (HCC) [I50.9] 08/12/2015  . Anemia [D64.9] 08/12/2015  . HTN (hypertension) [I10] 08/12/2015  . Alcohol abuse with intoxication, uncomplicated (HCC) [F10.120] 07/31/2015  . MDD (major depressive disorder), recurrent severe, without psychosis (HCC) [F33.2] 07/31/2015  . Suicidal ideation [R45.851] 07/31/2015  . Homicidal ideation [R45.850] 07/31/2015  . Alcohol abuse with alcohol-induced mood disorder (HCC) [F10.14] 01/25/2015  . Alcohol use disorder, severe, dependence (HCC) [F10.20] 12/17/2014  . Alcohol abuse [F10.10] 08/07/2014   Pt was seen and chart reviewed with Dr Jannifer [facility]. Pt was admitted to the [facility] for suicidal ideations while intoxicated. Pt's BAL 373 on admission.  Pt denies suicidal/homicidal ideation, denies auditory/visual hallucinations and does not appear to be responding to internal stimuli.  Pt has been seen in the emergency room multiple times for the same issue and fails to follow up with outpatient services. Pt will be referred to [facility]. Pt is psychiatrically cleared for discharge.   Total Time spent with patient: 45 minutes  Musculoskeletal: Strength & Muscle Tone: within normal limits Gait & Station: normal Patient leans: N/A  Psychiatric Specialty Exam:   Blood pressure (!) 141/89, pulse 91, temperature 98.3 F (36.8 C), temperature source Oral, resp. rate 18, SpO2 97 %.There is no height or weight on file to calculate BMI.  General Appearance: Disheveled  Eye Contact::  Fair  Speech:  [phone]  Volume:  Decreased  Mood:  Depressed and sleepy  Affect:   Congruent  Thought Process:  Coherent and Linear  Orientation:  Full (Time, Place, and Person)  Thought Content:  Illogical and continues to drink, won't follow up outpatient  Suicidal Thoughts:  No  Homicidal Thoughts:  No  Memory:  Immediate;   Good Recent;   Fair Remote;   Fair  Judgement:  Poor  Insight:  Lacking  Psychomotor Activity:  Decreased  Concentration:  Fair  Recall:  Good  Fund of Knowledge:Good  Language: Good  Akathisia:  No  Handed:  Right  AIMS (if indicated):     Assets:  Communication Skills Resilience Social Support  Sleep:     Cognition: WNL  ADL's:  Intact   Mental Status Per Nursing Assessment::   On Admission:     Demographic Factors:  Male, Low socioeconomic status and Unemployed  Loss Factors: Financial problems/change in socioeconomic status  Historical Factors: Impulsivity  Risk Reduction Factors:   Sense of responsibility to family  Continued Clinical Symptoms:  Depression:   Impulsivity Alcohol/Substance Abuse/Dependencies More than one psychiatric diagnosis  Cognitive Features That Contribute To Risk:  Closed-mindedness    Suicide Risk:  Minimal: No identifiable suicidal ideation.  Patients presenting with no risk factors but with morbid ruminations; may be classified as minimal risk based on the severity of the depressive symptoms    Plan Of Care/Follow-up recommendations:  Activity:  as tolerated Diet:  Heart Healthy  Laveda Abbe, NP 10/16/2016, 12:26 PM
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
## 2016-10-23 ENCOUNTER — Inpatient Hospital Stay (INDEPENDENT_AMBULATORY_CARE_PROVIDER_SITE_OTHER): Payer: Self-pay | Admitting: Physician Assistant

## 2016-11-12 ENCOUNTER — Emergency Department (HOSPITAL_COMMUNITY)
Admission: EM | Admit: 2016-11-12 | Discharge: 2016-11-15 | Disposition: A | Discharge status: Home / Self Care | Payer: Self-pay | Attending: Emergency Medicine | Admitting: Emergency Medicine

## 2016-11-12 ENCOUNTER — Emergency Department (HOSPITAL_COMMUNITY): Payer: Self-pay

## 2016-11-12 ENCOUNTER — Encounter (HOSPITAL_COMMUNITY): Payer: Self-pay

## 2016-11-12 DIAGNOSIS — F17200 Nicotine dependence, unspecified, uncomplicated: Secondary | ICD-10-CM | POA: Insufficient documentation

## 2016-11-12 DIAGNOSIS — F1910 Other psychoactive substance abuse, uncomplicated: Secondary | ICD-10-CM | POA: Diagnosis present

## 2016-11-12 DIAGNOSIS — R55 Syncope and collapse: Secondary | ICD-10-CM | POA: Insufficient documentation

## 2016-11-12 DIAGNOSIS — I10 Essential (primary) hypertension: Secondary | ICD-10-CM | POA: Insufficient documentation

## 2016-11-12 DIAGNOSIS — M545 Low back pain, unspecified: Secondary | ICD-10-CM

## 2016-11-12 DIAGNOSIS — F1994 Other psychoactive substance use, unspecified with psychoactive substance-induced mood disorder: Secondary | ICD-10-CM | POA: Diagnosis present

## 2016-11-12 DIAGNOSIS — R45851 Suicidal ideations: Secondary | ICD-10-CM | POA: Insufficient documentation

## 2016-11-12 LAB — BASIC METABOLIC PANEL
Anion gap: 9 (ref 5–15)
BUN: <5 mg/dL — ABNORMAL LOW (ref 6–20)
CALCIUM: 9.3 mg/dL (ref 8.9–10.3)
CO2: 29 mmol/L (ref 22–32)
CREATININE: 0.8 mg/dL (ref 0.61–1.24)
Chloride: 101 mmol/L (ref 101–111)
GFR calc Af Amer: >60 mL/min (ref >60)
GFR calc non Af Amer: >60 mL/min (ref >60)
GLUCOSE: 93 mg/dL (ref 65–99)
Potassium: 3.8 mmol/L (ref 3.5–5.1)
Sodium: 139 mmol/L (ref 135–145)

## 2016-11-12 LAB — CBC
HCT: 46.4 % (ref 39.0–52.0)
Hemoglobin: 15.6 g/dL (ref 13.0–17.0)
MCH: 29.7 pg (ref 26.0–34.0)
MCHC: 33.6 g/dL (ref 30.0–36.0)
MCV: 88.2 fL (ref 78.0–100.0)
Platelets: 265 10*3/uL (ref 150–400)
RBC: 5.26 MIL/uL (ref 4.22–5.81)
RDW: 16.3 % — ABNORMAL HIGH (ref 11.5–15.5)
WBC: 3.8 10*3/uL — ABNORMAL LOW (ref 4.0–10.5)

## 2016-11-12 LAB — CBG MONITORING, ED: GLUCOSE-CAPILLARY: 99 mg/dL (ref 65–99)

## 2016-11-12 LAB — URINALYSIS, ROUTINE W REFLEX MICROSCOPIC
Bacteria, UA: NONE SEEN
Bilirubin Urine: NEGATIVE
Glucose, UA: NEGATIVE mg/dL
Hgb urine dipstick: NEGATIVE
KETONES UR: NEGATIVE mg/dL
Leukocytes, UA: NEGATIVE
Nitrite: NEGATIVE
PH: 5 (ref 5.0–8.0)
Protein, ur: 30 mg/dL — AB
Specific Gravity, Urine: 1.029 (ref 1.005–1.030)

## 2016-11-12 LAB — RAPID URINE DRUG SCREEN, HOSP PERFORMED
Amphetamines: NOT DETECTED
BARBITURATES: NOT DETECTED
Benzodiazepines: NOT DETECTED
COCAINE: NOT DETECTED
Opiates: NOT DETECTED
Tetrahydrocannabinol: NOT DETECTED

## 2016-11-12 LAB — ETHANOL: ALCOHOL ETHYL (B): 85 mg/dL — AB (ref <10)

## 2016-11-12 LAB — ACETAMINOPHEN LEVEL: Acetaminophen (Tylenol), Serum: <10 ug/mL — ABNORMAL LOW (ref 10–30)

## 2016-11-12 LAB — SALICYLATE LEVEL

## 2016-11-12 LAB — I-STAT TROPONIN, ED: Troponin i, poc: 0 ng/mL (ref 0.00–0.08)

## 2016-11-12 MED ORDER — LORAZEPAM 2 MG/ML IJ SOLN: 0.0000 mg | Freq: Four times a day (QID) | INTRAMUSCULAR | Status: AC

## 2016-11-12 MED ORDER — THIAMINE HCL 100 MG/ML IJ SOLN: 100.0000 mg | Freq: Every day | INTRAMUSCULAR | Status: DC

## 2016-11-12 MED ORDER — CYCLOBENZAPRINE HCL 10 MG PO TABS
10.0000 mg | ORAL_TABLET | Freq: Once | ORAL | Status: AC
  Administered 2016-11-12: 10 mg via ORAL
  Filled 2016-11-12: qty 1

## 2016-11-12 MED ORDER — LORAZEPAM 2 MG/ML IJ SOLN: 0.0000 mg | Freq: Two times a day (BID) | INTRAMUSCULAR | Status: DC

## 2016-11-12 MED ORDER — ONDANSETRON HCL 4 MG PO TABS: 4.0000 mg | ORAL_TABLET | Freq: Three times a day (TID) | ORAL | Status: DC | PRN

## 2016-11-12 MED ORDER — ALUM & MAG HYDROXIDE-SIMETH 200-200-20 MG/5ML PO SUSP: 30.0000 mL | Freq: Four times a day (QID) | ORAL | Status: DC | PRN

## 2016-11-12 MED ORDER — KETOROLAC TROMETHAMINE 60 MG/2ML IM SOLN
60.0000 mg | Freq: Once | INTRAMUSCULAR | Status: AC
  Administered 2016-11-12: 60 mg via INTRAMUSCULAR
  Filled 2016-11-12: qty 2

## 2016-11-12 MED ORDER — LORAZEPAM 1 MG PO TABS: 0.0000 mg | ORAL_TABLET | Freq: Four times a day (QID) | ORAL | Status: AC

## 2016-11-12 MED ORDER — IBUPROFEN 200 MG PO TABS
600.0000 mg | ORAL_TABLET | Freq: Three times a day (TID) | ORAL | Status: DC | PRN
  Administered 2016-11-12 – 2016-11-15 (×4): 600 mg via ORAL
  Filled 2016-11-12 (×4): qty 1

## 2016-11-12 MED ORDER — LORAZEPAM 1 MG PO TABS: 0.0000 mg | ORAL_TABLET | Freq: Two times a day (BID) | ORAL | Status: DC

## 2016-11-12 MED ORDER — VITAMIN B-1 100 MG PO TABS
100.0000 mg | ORAL_TABLET | Freq: Every day | ORAL | Status: DC
  Administered 2016-11-12 – 2016-11-15 (×4): 100 mg via ORAL
  Filled 2016-11-12 (×4): qty 1

## 2016-11-12 MED ORDER — CYCLOBENZAPRINE HCL 10 MG PO TABS
10.0000 mg | ORAL_TABLET | Freq: Three times a day (TID) | ORAL | Status: DC | PRN
  Administered 2016-11-12 – 2016-11-15 (×7): 10 mg via ORAL
  Filled 2016-11-12 (×7): qty 1

## 2016-11-12 NOTE — BH Assessment (Signed)
Tele Assessment Note   Patient Name: Rodney Hartman. MRN: [id number] Referring Physician: Shaune Pollack, MD Location of Patient: [facility] Location of Provider: Behavioral Health TTS Department  [facility] Rodney Hartman. is an 56 y.o. male presenting to the ED with reports of alcohol abuse and SI with plan and intent.  The patient states he drinks 1 to 2 pints of liquor a day. The patient has been drinking to this extent for several years. Had three months of sobriety several years ago. The patient states he was living with a friend recently but lost his housing and his job. The patient was packing batteries on an assembly line. The patient states he has little to live for and indicated he has a plan to take his life by jumping into traffic. States he is HI but no plan. Reports he is angry with those that left him homeless. Has no history of violence. States he has A/V, hears someone calling his name.   The patient has a history of one psychiatric admission to [facility] and several admissions for SA at [facility] and [facility]. The patient was receiving outpatient services at [facility] but quit going. The patient has supportive family, his mother and sister. They would like him get help for SA and then they want to bring him to [place], his home town.   Donell Sievert, NP recommends inpatient treatment. TTS to look for placement  Diagnosis: MDD; Alcohol use disorder  Past Medical History:  Past Medical History:  Diagnosis Date  . Hypertension     No past surgical history on file.  Family History: No family history on file.  Social History:  reports that he has been smoking.  he has never used smokeless tobacco. He reports that he drinks alcohol. He reports that he does not use drugs.  Additional Social History:  Alcohol / Drug Use Pain Medications: see MAR Prescriptions: see MAR Over the Counter: see MAR History of alcohol / drug use?: Yes Substance #1 Name of Substance 1: alcohol 1 - Age of  First Use: UTA 1 - Amount (size/oz): liquor- 1 or 2 pints a day 1 - Frequency: daily 1 - Duration: the last 2 yrs 1 - Last Use / Amount: Thursday or Friday/ sober for 3 months a few yrs ago, [facility], [facility]  CIWA: CIWA-Ar BP: (!) 152/97 Pulse Rate: 90 Nausea and Vomiting: no nausea and no vomiting Tactile Disturbances: none Tremor: no tremor Auditory Disturbances: not present Paroxysmal Sweats: barely perceptible sweating, palms moist Visual Disturbances: not present Anxiety: no anxiety, at ease Headache, Fullness in Head: none present Agitation: normal activity Orientation and Clouding of Sensorium: oriented and can do serial additions CIWA-Ar Total: 1 COWS:    PATIENT STRENGTHS: (choose at least two) Average or above average intelligence General fund of knowledge  Allergies: No Known Allergies  Home Medications:  (Not in a hospital admission)  OB/GYN Status:  No LMP for male patient.  General Assessment Data Location of Assessment: [facility] ED TTS Assessment: In system Is this a Tele or Face-to-Face Assessment?: Tele Assessment Is this an Initial Assessment or a Re-assessment for this encounter?: Initial Assessment Marital status: Single Maiden name: (n/a) Is patient pregnant?: No Pregnancy Status: No Living Arrangements: Other (Comment)(homeless) Can pt return to current living arrangement?: Yes Admission Status: Voluntary Is patient capable of signing voluntary admission?: Yes Referral Source: Self/Family/Friend Insurance type: self pay  Medical Screening Exam [facility] Walk-in ONLY) Medical Exam completed: Yes  Crisis Care Plan Living Arrangements: Other (Comment)(homeless)  Name of Psychiatrist: n/a Name of Therapist: n/a  Education Status Is patient currently in school?: No Highest grade of school patient has completed: 12th grade  Risk to self with the past 6 months Suicidal Ideation: Yes-Currently Present Has patient been a risk to self within the past 6  months prior to admission? : Yes Suicidal Intent: Yes-Currently Present Has patient had any suicidal intent within the past 6 months prior to admission? : No Is patient at risk for suicide?: Yes Suicidal Plan?: Yes-Currently Present Has patient had any suicidal plan within the past 6 months prior to admission? : Yes Specify Current Suicidal Plan: jump in front of a car Access to Means: Yes Specify Access to Suicidal Means: ability to walk into traffic What has been your use of drugs/alcohol within the last 12 months?: alcohol  Previous Attempts/Gestures: No How many times?: 0 Other Self Harm Risks: 0 Triggers for Past Attempts: None known Intentional Self Injurious Behavior: Cutting Comment - Self Injurious Behavior: (stabbed self in leg once, not SI attempt) Family Suicide History: Yes(a few of my cousins) Recent stressful life event(s): Job Loss, Other (Comment) Persecutory voices/beliefs?: No Depression: Yes Depression Symptoms: Despondent Substance abuse history and/or treatment for substance abuse?: Yes Suicide prevention information given to non-admitted patients: Not applicable  Risk to Others within the past 6 months Homicidal Ideation: Yes-Currently Present Does patient have any lifetime risk of violence toward others beyond the six months prior to admission? : No Thoughts of Harm to Others: Yes-Currently Present Comment - Thoughts of Harm to Others: the people I was staying with Current Homicidal Intent: Yes-Currently Present Current Homicidal Plan: No Access to Homicidal Means: No Identified Victim: (n/a) History of harm to others?: No Assessment of Violence: None Noted Violent Behavior Description: n/a Does patient have access to weapons?: Yes (Comment)(has a gun but friend has it) Criminal Charges Pending?: No Does patient have a court date: No Is patient on probation?: No  Psychosis Hallucinations: Auditory(someone keeps calling my name) Delusions: None  noted  Mental Status Report Appearance/Hygiene: Unable to Assess Eye Contact: Unable to Assess Motor Activity: Freedom of movement Speech: Logical/coherent Level of Consciousness: Alert Mood: Depressed Affect: Depressed Anxiety Level: Moderate Thought Processes: Coherent, Relevant Judgement: Impaired Orientation: Person, Place, Time, Situation Obsessive Compulsive Thoughts/Behaviors: None  Cognitive Functioning Concentration: Normal Memory: Recent Intact, Remote Intact IQ: Average Insight: Poor Impulse Control: Poor Appetite: Poor Weight Loss: 0 Weight Gain: 0 Sleep: Decreased Vegetative Symptoms: None  ADLScreening [facility] Assessment Services) Patient's cognitive ability adequate to safely complete daily activities?: Yes Patient able to express need for assistance with ADLs?: Yes Independently performs ADLs?: Yes (appropriate for developmental age)  Prior Inpatient Therapy Prior Inpatient Therapy: Yes Prior Therapy Dates: over 2 yrs now Prior Therapy Facilty/Provider(s): [facility] Reason for Treatment: depression, SA  Prior Outpatient Therapy Prior Outpatient Therapy: Yes Prior Therapy Dates: in the past Prior Therapy Facilty/Provider(s): [facility] Reason for Treatment: depression, SA Does patient have an ACCT team?: No Does patient have Intensive In-House Services?  : No Does patient have [facility] services? : No Does patient have [facility] services?: No  ADL Screening (condition at time of admission) Patient's cognitive ability adequate to safely complete daily activities?: Yes Is the patient deaf or have difficulty hearing?: No Does the patient have difficulty seeing, even when wearing glasses/contacts?: No Does the patient have difficulty concentrating, remembering, or making decisions?: No Patient able to express need for assistance with ADLs?: Yes Does the patient have difficulty dressing or bathing?:  No Independently performs ADLs?: Yes (appropriate for  developmental age)       Abuse/Neglect Assessment (Assessment to be complete while patient is alone) Abuse/Neglect Assessment Can Be Completed: Unable to assess, patient is non-responsive or altered mental status     Advance Directives (For Healthcare) Does Patient Have a Medical Advance Directive?: No Would patient like information on creating a medical advance directive?: No - Patient declined    Additional Information 1:1 In Past 12 Months?: No CIRT Risk: No Elopement Risk: No Does patient have medical clearance?: Yes     Disposition:  Disposition Initial Assessment Completed for this Encounter: Yes Disposition of Patient: Inpatient treatment program Type of inpatient treatment program: Adult  This service was provided via telemedicine using a 2-way, interactive audio and video technology.    Vonzell Schlattershley H Emer Onnen 11/12/2016 9:06 PM

## 2016-11-12 NOTE — ED Provider Notes (Addendum)
[facility] EMERGENCY DEPARTMENT Provider Note   CSN: [id number] Arrival date & time: 11/12/16  1226     History   Chief Complaint Chief Complaint  Patient presents with  . Loss of Consciousness  . Suicidal    HPI Rodney Abair. is a 56 y.o. male.  HPI   56 year old male with history of hypertension, alcohol abuse, who presents with multiple issues:  Syncope: pt was walking down the street 5 days ago and reports he passed out. Unsure what happened. He states he has h/o same, usually when drinking alcohol. Denies EtOH intake but EtOH 85 on arrival. He had no preceding or subsequent CP or SOB. No headache. No seizure like activity. He has not had recurrence. He fell and landed on his buttocks, has since had aching, throbbing lower back pain. No radiation to legs. No numbness or weakness. No loss of bowel or bladder function.  Suicidal ideaiton: H/o depression. He reports that his life has been "going downhill" for one month. He's had worsening sleep and appetite. Now having SI with plan to end it all. Reports he has thought about running into traffic. When asked what is stopping him, he states "I don't know." No longer f/u with [facility].  Past Medical History:  Diagnosis Date  . Hypertension     There are no active problems to display for this patient.   No past surgical history on file.     Home Medications    Prior to Admission medications   Medication Sig Start Date End Date Taking? Authorizing Provider  naproxen sodium (ALEVE) 220 MG tablet Take 220 mg 2 (two) times daily as needed by mouth (pain).   Yes [provider]    Family History No family history on file.  Social History Social History   Tobacco Use  . Smoking status: Current Every Day Smoker  . Smokeless tobacco: Never Used  Substance Use Topics  . Alcohol use: Yes  . Drug use: No     Allergies   Patient has no known allergies.   Review of Systems Review  of Systems  Constitutional: Positive for fatigue.  Musculoskeletal: Positive for arthralgias, back pain and gait problem.  Neurological: Positive for syncope.  Psychiatric/Behavioral: Positive for dysphoric mood, sleep disturbance and suicidal ideas.  All other systems reviewed and are negative.    Physical Exam Updated Vital Signs BP (!) 161/99   Pulse 92   Temp 98.3 F (36.8 C) (Oral)   Resp 17   SpO2 99%   Physical Exam  Constitutional: He is oriented to person, place, and time. He appears well-developed and well-nourished. No distress.  HENT:  Head: Normocephalic and atraumatic.  Markedly poor dentition  Eyes: Conjunctivae are normal.  Neck: Neck supple.  Cardiovascular: Normal rate, regular rhythm and normal heart sounds. Exam reveals no friction rub.  No murmur heard. Pulmonary/Chest: Effort normal and breath sounds normal. No respiratory distress. He has no wheezes. He has no rales.  Abdominal: He exhibits no distension.  Musculoskeletal: He exhibits no edema.  Neurological: He is alert and oriented to person, place, and time. He exhibits normal muscle tone.  Skin: Skin is warm. Capillary refill takes less than 2 seconds.  Psychiatric: He has a normal mood and affect.  Nursing note and vitals reviewed.   Spine Exam: Inspection/Palpation: TTP throughout lower lumbar spine, no midline deformity. No open wounds. Strength: 5/5 throughout LE bilaterally (hip flexion/extension, adduction/abduction; knee flexion/extension; foot dorsiflexion/plantarflexion, inversion/eversion; great toe inversion)  Sensation: Intact to light touch in proximal and distal LE bilaterally Reflexes: 2+ quadriceps and achilles reflexes   ED Treatments / Results  Labs (all labs ordered are listed, but only abnormal results are displayed) Labs Reviewed  BASIC METABOLIC PANEL - Abnormal; Notable for the following components:      Result Value   BUN <5 (*)    All other components within normal  limits  CBC - Abnormal; Notable for the following components:   WBC 3.8 (*)    RDW 16.3 (*)    All other components within normal limits  URINALYSIS, ROUTINE W REFLEX MICROSCOPIC - Abnormal; Notable for the following components:   Color, Urine AMBER (*)    APPearance HAZY (*)    Protein, ur 30 (*)    Squamous Epithelial / LPF 0-5 (*)    All other components within normal limits  ETHANOL - Abnormal; Notable for the following components:   Alcohol, Ethyl (B) 85 (*)    All other components within normal limits  ACETAMINOPHEN LEVEL - Abnormal; Notable for the following components:   Acetaminophen (Tylenol), Serum <10 (*)    All other components within normal limits  SALICYLATE LEVEL  RAPID URINE DRUG SCREEN, HOSP PERFORMED  CBG MONITORING, ED  I-STAT TROPONIN, ED  I-STAT TROPONIN, ED    EKG  EKG Interpretation  Date/Time:  Monday November 12 2016 12:32:34 EST Ventricular Rate:  82 PR Interval:  218 QRS Duration: 84 QT Interval:  394 QTC Calculation: 460 R Axis:   65 Text Interpretation:  Sinus rhythm with 1st degree A-V block Otherwise normal ECG No old tracing to compare Confirmed by Shaune Pollack [phone]) on 11/12/2016 3:40:31 PM       Radiology Dg Chest 2 View  Result Date: 11/12/2016 CLINICAL DATA:  Syncopal episode last week. EXAM: CHEST  2 VIEW COMPARISON:  None. FINDINGS: Slightly low lung volumes with atelectasis at the left lung base. Cannot exclude a trace left pleural effusion laterally. Normal size heart and mediastinal contours. No acute nor suspicious osseous abnormalities. IMPRESSION: Low lung volumes with left base atelectasis. Possible tiny left pleural effusion along the lateral costophrenic angle. Otherwise negative exam. Electronically Signed   By: Tollie Eth M.D.   On: 11/12/2016 18:03   Dg Lumbar Spine Complete  Result Date: 11/12/2016 CLINICAL DATA:  Patient passed out last week and now presents with mid back pain radiating the sacrum. EXAM: LUMBAR  SPINE - COMPLETE 4+ VIEW COMPARISON:  None. FINDINGS: There is no evidence of lumbar spine fracture. The included sacrum is maintained. Normal lumbar segmentation. Multilevel degenerative facet arthropathy characterized by hypertrophy and sclerosis from at least L3 caudad through S1. Intact sacroiliac joints bilaterally. No pars defects or listhesis. Alignment is normal. Intervertebral disc spaces are maintained. IMPRESSION: Lumbar facet arthropathy.  No acute osseous appearing abnormality. Electronically Signed   By: Tollie Eth M.D.   On: 11/12/2016 18:01    Procedures Procedures (including critical care time)  Medications Ordered in ED Medications  LORazepam (ATIVAN) injection 0-4 mg (0 mg Intravenous Not Given 11/12/16 1636)    Or  LORazepam (ATIVAN) tablet 0-4 mg ( Oral See Alternative 11/12/16 1636)  LORazepam (ATIVAN) injection 0-4 mg (not administered)    Or  LORazepam (ATIVAN) tablet 0-4 mg (not administered)  thiamine (VITAMIN B-1) tablet 100 mg (100 mg Oral Given 11/12/16 1750)    Or  thiamine (B-1) injection 100 mg ( Intravenous See Alternative 11/12/16 1750)  ondansetron (ZOFRAN) tablet 4 mg (not administered)  alum & mag hydroxide-simeth (MAALOX/MYLANTA) 200-200-20 MG/5ML suspension 30 mL (not administered)  ibuprofen (ADVIL,MOTRIN) tablet 600 mg (not administered)  cyclobenzaprine (FLEXERIL) tablet 10 mg (not administered)  ketorolac (TORADOL) injection 60 mg (60 mg Intramuscular Given 11/12/16 1645)  cyclobenzaprine (FLEXERIL) tablet 10 mg (10 mg Oral Given 11/12/16 1645)     Initial Impression / Assessment and Plan / ED Course  I have reviewed the triage vital signs and the nursing notes.  Pertinent labs & imaging results that were available during my care of the patient were reviewed by me and considered in my medical decision making (see chart for details).     56 yo M here with back pain after syncopal episode, also depression with SI.  Back pain: Plain films  negative. No LE weakness, numbness, loss of bowel/bladder or sx to suggest cauda equina. He has been ambulatory. No fevers, leukocytosis, or sx to suggest infectious etiology. No other red flags. Tx with flexeril, NSAIDs.  Syncope: Unclear etiology. EKG non-ischemic, normal intervals, no change from prior (1st degree AV, no other change). Lytes, labs reassuring. He has no focal neurological deficits. CT head neg. Doubt ischemia, CVA, seizure. He has h/o same when drinking and has +EtOH today. Will benefit from outpt Holter monitor, no emergent indication at this time.  No signs of DVT or PE.  Depression: TTS consulted.  Final Clinical Impressions(s) / ED Diagnoses   Final diagnoses:  Syncope, unspecified syncope type  Acute midline low back pain without sciatica  Suicidal ideation    ED Discharge Orders    None       Shaune PollackIsaacs, Ludwika Rodd, MD 11/12/16 Corky Crafts1955    Shaune PollackIsaacs, Analyce Tavares, MD 11/12/16 [phone]

## 2016-11-12 NOTE — ED Notes (Signed)
TTS in progress 

## 2016-11-12 NOTE — ED Notes (Signed)
Patient taken to CT.

## 2016-11-12 NOTE — BH Assessment (Signed)
Spencer Simon, NP recommends inpatient treatment. TTS to look for placement   

## 2016-11-12 NOTE — ED Notes (Addendum)
Pt belongings was placed in locker #1.  Valuables was locked up with security.

## 2016-11-12 NOTE — ED Triage Notes (Addendum)
Pt reports passing out last week and now having midback pain to sacrum worse when sitting. Pt states he passed out while walking down sidewalk. Hx of same. Denies chest pain.  Pt reports having thought of wanting to hurt himself today and for the past several days. Denies plan. PT changed into scrubs and placed in triage waiting area

## 2016-11-12 NOTE — ED Notes (Signed)
Pt provided with meal tray.

## 2016-11-13 NOTE — Progress Notes (Signed)
Dr. Lucianne MussKumar recommends the patient be referred to inpatient substance use treatment faculties due to the patient's extensive history of ETOH abuse. [facility] - no male detox or treatment beds today (11/13/16).    Patient meets criteria for inpatient treatment for substance use. CSW faxed referrals to the following inpatient facilities for review:  [facility]   Patient meets criteria for inpatient treatment. CSW faxed referrals to the following inpatient facilities for review:  [facility], [facility], [facility], [facility], [facility], [address], [address] [facility] [facility], [facility], [facility], [facility],    TTS will continue to seek bed placement.    Baldo DaubJolan Dalayza Zambrana MSW, LCSWA CSW Disposition [phone]

## 2016-11-13 NOTE — ED Notes (Signed)
Pt given heat pack for back pain 

## 2016-11-13 NOTE — ED Notes (Addendum)
Patient was given Coke, [place] [employer] Crackers. A Regular Diet ordered for Lunch.

## 2016-11-13 NOTE — ED Notes (Signed)
Pt c/o low back pain, requesting Flexeril.

## 2016-11-13 NOTE — ED Notes (Signed)
Pt noted to be using urinal - advised pt unable to do so d/t pt is ambulatory. Pt states "they've been taking me around in a chair". Pt ambulated to bathroom and back to room - noted to be limping every few steps d/t c/o back pain.

## 2016-11-14 ENCOUNTER — Encounter (HOSPITAL_COMMUNITY): Payer: Self-pay | Admitting: Registered Nurse

## 2016-11-14 DIAGNOSIS — F1910 Other psychoactive substance abuse, uncomplicated: Secondary | ICD-10-CM | POA: Diagnosis present

## 2016-11-14 DIAGNOSIS — F1994 Other psychoactive substance use, unspecified with psychoactive substance-induced mood disorder: Secondary | ICD-10-CM

## 2016-11-14 DIAGNOSIS — F17210 Nicotine dependence, cigarettes, uncomplicated: Secondary | ICD-10-CM

## 2016-11-14 DIAGNOSIS — R45851 Suicidal ideations: Secondary | ICD-10-CM | POA: Insufficient documentation

## 2016-11-14 NOTE — ED Notes (Signed)
Dinner tray at bedside.  Patient eating dinner.
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
## 2016-11-14 NOTE — ED Notes (Signed)
Pt declined snack at this time 

## 2016-11-14 NOTE — Discharge Instructions (Addendum)
Follow-up with outpatient behavioral health as previously arranged.

## 2016-11-14 NOTE — ED Notes (Signed)
Dinner tray ordered.

## 2016-11-14 NOTE — ED Notes (Signed)
Breakfast order placed ?

## 2016-11-14 NOTE — ED Notes (Signed)
Lunch tray ordered 

## 2016-11-14 NOTE — ED Provider Notes (Signed)
Received care patient at 739 AM.  56 year old male who presented with concern for alcohol abuse and suicidal ideation with a plan and intent.  At time of my initial evaluation, patient sleeping comfortably.  TTS initially evaluated patient, and recommended inpatient treatment, however after evaluation today are recommending discharge.  On my evaluation prior to discharge, patient continues to report suicidal ideation.  Reports he had a history of suicidal ideation in the past, but at this time his symptoms are more severe, "the worst that they have ever been."  He has no prior admissions through our system but [facility] and [facility].  He has never been seen at our hospital prior to this visit.   On arrival to the ED initially reported he had SI with plan to end it all, which was running into traffic.  Given patient cannot contract for safety at this time and reports continuing suicidal ideation, I do not feel comfortable discharging the patient.  [facility].  We will plan to continue to observe him today, with repeat evaluation tomorrow morning given my concern regarding safe discharge with continuing SI.    Alvira MondaySchlossman, Dewel Lotter, MD 11/14/16 [phone]

## 2016-11-14 NOTE — ED Notes (Signed)
EDP wants to keep patient in the ED overnight and have another TTS in the morning for reevaluation.

## 2016-11-14 NOTE — Progress Notes (Signed)
CSW received call from Disposition CSW Jolan informing CSW that pt would be being discharged today and needing shelter resources. CSW provided pt's RN with a shelter list for pt and a bus pass. At this time there are no further CSW needs. CSW signing off.      Claude MangesKierra S. Amere Bricco, MSW, LCSW-A Emergency Department Clinical Social Worker [phone]

## 2016-11-14 NOTE — ED Notes (Signed)
Snack given to patient.  

## 2016-11-14 NOTE — ED Notes (Signed)
Patient was being discharged by EDP when he stated that he feels worse now than ever before and has plans to commit suicide if discharged.  EDP does not feel comfortable discharging patient with Suicidal ideation.  Contacting [facility].

## 2016-11-14 NOTE — Progress Notes (Signed)
Per Assunta FoundShuvon Rankin, NP, the patient does not meet criteria for inpatient treatment. The patient is recommended for discharge and to follow up with an outpatient provider.   The patient is psychiatrically cleared at this time.    [facility] - no beds available today [facility] - no male beds today  Disposition CSW recommended that the patient continue to follow up with substance abuse treatment programs for a possible bed.   CS[facility]  - [facility] at [phone]  Fredirick MaudlinHolley Hall, RN notified   Baldo DaubJolan Mindi Akerson MSW, LCSWA CSW Disposition [phone]

## 2016-11-15 NOTE — ED Notes (Signed)
Pt given belongings back. Pt provided with bus pass.

## 2016-11-15 NOTE — ED Notes (Signed)
Regular Diet has been ordered for Lunch. 

## 2016-11-15 NOTE — ED Notes (Signed)
[facility]- pt is well known to [facility], will contact their providers and Dr. Judd Lienelo for more information about disposition.

## 2016-11-15 NOTE — ED Notes (Signed)
Pt endorses SI states "I been trying to think of a plan", pt endorses HI states "I been trying to think of a plan". Pt denies hallucinations at this time. Pt has no complaints or requests at this time.

## 2016-11-15 NOTE — ED Notes (Signed)
Pt sitting up to eat lunch. Will D/C when finished eating.

## 2016-11-16 ENCOUNTER — Encounter (HOSPITAL_COMMUNITY): Payer: Self-pay | Admitting: Emergency Medicine

## 2016-11-21 ENCOUNTER — Telehealth (HOSPITAL_COMMUNITY): Payer: Self-pay
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
## 2018-06-17 ENCOUNTER — Other Ambulatory Visit: Payer: Self-pay | Admitting: *Deleted

## 2018-06-17 DIAGNOSIS — Z20822 Contact with and (suspected) exposure to covid-19: Secondary | ICD-10-CM

## 2018-06-18 LAB — NOVEL CORONAVIRUS, NAA: SARS-CoV-2, NAA: NOT DETECTED

## 2018-06-23 ENCOUNTER — Encounter (HOSPITAL_COMMUNITY): Payer: Self-pay | Admitting: Emergency Medicine

## 2018-06-23 ENCOUNTER — Other Ambulatory Visit: Payer: Self-pay

## 2018-06-23 ENCOUNTER — Inpatient Hospital Stay (HOSPITAL_COMMUNITY)
Admission: EM | Admit: 2018-06-23 | Discharge: 2018-06-26 | DRG: 378 | Disposition: A | Discharge status: Home / Self Care | Payer: Self-pay | Attending: Internal Medicine | Admitting: Internal Medicine

## 2018-06-23 DIAGNOSIS — Z1159 Encounter for screening for other viral diseases: Secondary | ICD-10-CM

## 2018-06-23 DIAGNOSIS — K921 Melena: Secondary | ICD-10-CM

## 2018-06-23 DIAGNOSIS — R1110 Vomiting, unspecified: Secondary | ICD-10-CM

## 2018-06-23 DIAGNOSIS — K250 Acute gastric ulcer with hemorrhage: Principal | ICD-10-CM | POA: Diagnosis present

## 2018-06-23 DIAGNOSIS — D649 Anemia, unspecified: Secondary | ICD-10-CM

## 2018-06-23 DIAGNOSIS — Z590 Homelessness: Secondary | ICD-10-CM

## 2018-06-23 DIAGNOSIS — K319 Disease of stomach and duodenum, unspecified: Secondary | ICD-10-CM | POA: Diagnosis present

## 2018-06-23 DIAGNOSIS — D62 Acute posthemorrhagic anemia: Secondary | ICD-10-CM | POA: Diagnosis present

## 2018-06-23 DIAGNOSIS — Z79899 Other long term (current) drug therapy: Secondary | ICD-10-CM

## 2018-06-23 DIAGNOSIS — Z8249 Family history of ischemic heart disease and other diseases of the circulatory system: Secondary | ICD-10-CM

## 2018-06-23 DIAGNOSIS — K210 Gastro-esophageal reflux disease with esophagitis: Secondary | ICD-10-CM | POA: Diagnosis present

## 2018-06-23 DIAGNOSIS — E876 Hypokalemia: Secondary | ICD-10-CM | POA: Diagnosis present

## 2018-06-23 DIAGNOSIS — F1010 Alcohol abuse, uncomplicated: Secondary | ICD-10-CM | POA: Diagnosis present

## 2018-06-23 DIAGNOSIS — K922 Gastrointestinal hemorrhage, unspecified: Secondary | ICD-10-CM | POA: Diagnosis present

## 2018-06-23 DIAGNOSIS — Z8711 Personal history of peptic ulcer disease: Secondary | ICD-10-CM

## 2018-06-23 DIAGNOSIS — F17210 Nicotine dependence, cigarettes, uncomplicated: Secondary | ICD-10-CM | POA: Diagnosis present

## 2018-06-23 LAB — ETHANOL: Alcohol, Ethyl (B): <10 mg/dL (ref <10)

## 2018-06-23 LAB — CBC WITH DIFFERENTIAL/PLATELET
Abs Immature Granulocytes: 0.02 10*3/uL (ref 0.00–0.07)
Basophils Absolute: 0 10*3/uL (ref 0.0–0.1)
Basophils Relative: 0 %
Eosinophils Absolute: 0.1 10*3/uL (ref 0.0–0.5)
Eosinophils Relative: 1 %
HCT: 30 % — ABNORMAL LOW (ref 39.0–52.0)
Hemoglobin: 9.8 g/dL — ABNORMAL LOW (ref 13.0–17.0)
Immature Granulocytes: 0 %
Lymphocytes Relative: 10 %
Lymphs Abs: 0.8 10*3/uL (ref 0.7–4.0)
MCH: 30.9 pg (ref 26.0–34.0)
MCHC: 32.7 g/dL (ref 30.0–36.0)
MCV: 94.6 fL (ref 80.0–100.0)
Monocytes Absolute: 0.5 10*3/uL (ref 0.1–1.0)
Monocytes Relative: 7 %
Neutro Abs: 6 10*3/uL (ref 1.7–7.7)
Neutrophils Relative %: 82 %
Platelets: 180 10*3/uL (ref 150–400)
RBC: 3.17 MIL/uL — ABNORMAL LOW (ref 4.22–5.81)
RDW: 14.6 % (ref 11.5–15.5)
WBC: 7.4 10*3/uL (ref 4.0–10.5)
nRBC: 0 % (ref 0.0–0.2)

## 2018-06-23 LAB — COMPREHENSIVE METABOLIC PANEL
ALT: 45 U/L — ABNORMAL HIGH (ref 0–44)
AST: 36 U/L (ref 15–41)
Albumin: 4.3 g/dL (ref 3.5–5.0)
Alkaline Phosphatase: 58 U/L (ref 38–126)
Anion gap: 11 (ref 5–15)
BUN: 29 mg/dL — ABNORMAL HIGH (ref 6–20)
CO2: 27 mmol/L (ref 22–32)
Calcium: 9.9 mg/dL (ref 8.9–10.3)
Chloride: 100 mmol/L (ref 98–111)
Creatinine, Ser: 0.82 mg/dL (ref 0.61–1.24)
GFR calc Af Amer: >60 mL/min (ref >60)
GFR calc non Af Amer: >60 mL/min (ref >60)
Glucose, Bld: 93 mg/dL (ref 70–99)
Potassium: 2.8 mmol/L — ABNORMAL LOW (ref 3.5–5.1)
Sodium: 138 mmol/L (ref 135–145)
Total Bilirubin: 1.2 mg/dL (ref 0.3–1.2)
Total Protein: 7.7 g/dL (ref 6.5–8.1)

## 2018-06-23 LAB — TYPE AND SCREEN
ABO/RH(D): A POS
Antibody Screen: NEGATIVE

## 2018-06-23 LAB — PROTIME-INR
INR: 1 (ref 0.8–1.2)
Prothrombin Time: 13.2 seconds (ref 11.4–15.2)

## 2018-06-23 LAB — POC OCCULT BLOOD, ED: Fecal Occult Bld: POSITIVE — AB

## 2018-06-23 LAB — ABO/RH: ABO/RH(D): A POS

## 2018-06-23 LAB — APTT: aPTT: 27 seconds (ref 24–36)

## 2018-06-23 MED ORDER — POTASSIUM CHLORIDE 10 MEQ/100ML IV SOLN
10.0000 meq | Freq: Once | INTRAVENOUS | Status: AC
  Administered 2018-06-23: 10 meq via INTRAVENOUS
  Filled 2018-06-23: qty 100

## 2018-06-23 MED ORDER — PANTOPRAZOLE SODIUM 40 MG IV SOLR
40.0000 mg | Freq: Once | INTRAVENOUS | Status: AC
  Administered 2018-06-23: 40 mg via INTRAVENOUS
  Filled 2018-06-23: qty 40

## 2018-06-23 MED ORDER — PANTOPRAZOLE SODIUM 40 MG IV SOLR: 40.0000 mg | Freq: Two times a day (BID) | INTRAVENOUS | Status: DC

## 2018-06-23 MED ORDER — PANTOPRAZOLE SODIUM 40 MG IV SOLR
40.0000 mg | Freq: Once | INTRAVENOUS | Status: AC
  Administered 2018-06-24: 40 mg via INTRAVENOUS
  Filled 2018-06-23: qty 40

## 2018-06-23 MED ORDER — SODIUM CHLORIDE 0.9 % IV SOLN
8.0000 mg/h | INTRAVENOUS | Status: DC
  Administered 2018-06-24 (×2): 8 mg/h via INTRAVENOUS
  Filled 2018-06-23 (×2): qty 80

## 2018-06-23 MED ORDER — MORPHINE SULFATE (PF) 4 MG/ML IV SOLN: 4.0000 mg | Freq: Once | INTRAVENOUS | Status: DC | PRN

## 2018-06-23 MED ORDER — POTASSIUM CHLORIDE 10 MEQ/100ML IV SOLN
10.0000 meq | INTRAVENOUS | Status: AC
  Administered 2018-06-24 (×4): 10 meq via INTRAVENOUS
  Filled 2018-06-23 (×4): qty 100

## 2018-06-23 MED ORDER — SODIUM CHLORIDE 0.9 % IV SOLN
INTRAVENOUS | Status: DC
  Administered 2018-06-23: 22:00:00 via INTRAVENOUS

## 2018-06-23 MED ORDER — SODIUM CHLORIDE 0.9 % IV BOLUS
1000.0000 mL | Freq: Once | INTRAVENOUS | Status: AC
  Administered 2018-06-23: 1000 mL via INTRAVENOUS

## 2018-06-23 NOTE — H&P (Addendum)
Rodney Hartman:[id number] DOB: 09-16-1960 DOA: 06/23/2018     PCP: Patient, No Pcp Per   Outpatient Specialists: NONE    Patient arrived to ER on 06/23/18 at 1933  Patient coming from: Homeless  Chief Complaint:  Chief Complaint  Patient presents with  . Abdominal Pain  . Emesis  . Headache    HPI: Rodney Hartman is a 58 y.o. male with medical history significant of homelessness, alcohol abuse subdural hematoma status post head injury History of delirium tremens psychosis strep peptic ulcer disease anemia hypertension,  Presented with 2 days of nausea vomiting dark stools headaches and abdominal pain feels lightheaded Currently resides at home a shelter Per EMS heart rate 110 blood pressure 126/69 Reports vomiting brown fluid 3-4 times a day feels very weak has been using Goody powder to help with his stomach pains.  States he is currently not drinking Denies fevers Patient reporting melena for past 2 to 3 days  Reports he drinks a few alcoholic drinks per week states her last alcohol was on Thursday does report he usually gets withdrawal Has been only feeling lightheaded this time around. States has remote history of peptic ulcer disease about 20 years ago at outside facility requiring endoscopy  Infectious risk factors:  Reports none In  ER RAPID COVID TEST in House test Pending    Regarding pertinent Chronic problems:   EtOH abuse with past hx of withdrawal  PUD with past hx of GI bleed   While in ER: Hemoccult positive, Drop in Hg   Noted to be hypokalemic started on Protonix And GI has been consulted The following Work up has been ordered so far:  Orders Placed This Encounter  Procedures  . Critical Care  . SARS Coronavirus 2 (CEPHEID - Performed in [place] hospital lab), [facility]  . Comprehensive metabolic panel  . CBC WITH DIFFERENTIAL  . APTT  . Protime-INR  . Ethanol  . Magnesium  . Phosphorus  . Diet NPO time specified  . Place X2  Large Bore IV's  . Initiate Carrier Fluid Protocol  . Face-to-face encounter (required for Medicare/Medicaid patients)  . Home Health  . Cardiac monitoring  . Consult to hospitalist  ALL PATIENTS BEING ADMITTED/HAVING PROCEDURES NEED COVID-19 SCREENING  . Consult to gastroenterology  ALL PATIENTS BEING ADMITTED/HAVING PROCEDURES NEED COVID-19 SCREENING  . Social work consult  . Droplet precaution  . Pulse oximetry, continuous  . POC occult blood, ED  . Type and screen  . ABO/Rh  . Insert peripheral IV  . Place in observation (patient's expected length of stay will be less than 2 midnights)    Following Medications were ordered in ER: Medications  sodium chloride 0.9 % bolus 1,000 mL (0 mLs Intravenous Stopped 06/23/18 2255)    And  0.9 %  sodium chloride infusion ( Intravenous New Bag/Given 06/23/18 2144)  morphine 4 MG/ML injection 4 mg (has no administration in time range)  potassium chloride 10 mEq in 100 mL IVPB (has no administration in time range)  pantoprazole (PROTONIX) 80 mg in sodium chloride 0.9 % 250 mL (0.32 mg/mL) infusion (has no administration in time range)  pantoprazole (PROTONIX) injection 40 mg (has no administration in time range)  pantoprazole (PROTONIX) injection 40 mg (has no administration in time range)  pantoprazole (PROTONIX) injection 40 mg (40 mg Intravenous Given 06/23/18 2247)  potassium chloride 10 mEq in 100 mL IVPB (10 mEq Intravenous New Bag/Given 06/23/18 2254)  Consult Orders  (From admission, onward)         Start     Ordered   06/23/18 2333  Social work consult  Once    Provider:  (Not yet assigned)  Question:  Reason for Consult?  Answer:  homeless   06/23/18 2333           Significant initial  Findings: Abnormal Labs Reviewed  COMPREHENSIVE METABOLIC PANEL - Abnormal; Notable for the following components:      Result Value   Potassium 2.8 (*)    BUN 29 (*)    ALT 45 (*)    All other components within normal limits  CBC  WITH DIFFERENTIAL/PLATELET - Abnormal; Notable for the following components:   RBC 3.17 (*)    Hemoglobin 9.8 (*)    HCT 30.0 (*)    All other components within normal limits  POC OCCULT BLOOD, ED - Abnormal; Notable for the following components:   Fecal Occult Bld POSITIVE (*)    All other components within normal limits     Otherwise labs showing:    Recent Labs  Lab 06/23/18 2129  NA 138  K 2.8*  CO2 27  GLUCOSE 93  BUN 29*  CREATININE 0.82  CALCIUM 9.9    Cr   Stable,  Lab Results  Component Value Date   CREATININE 0.82 06/23/2018   CREATININE 0.80 11/12/2016   CREATININE 0.83 10/16/2016    Recent Labs  Lab 06/23/18 2129  AST 36  ALT 45*  ALKPHOS 58  BILITOT 1.2  PROT 7.7  ALBUMIN 4.3   Lab Results  Component Value Date   CALCIUM 9.9 06/23/2018   PHOS 3.7 12/20/2014    WBC      Component Value Date/Time   WBC 7.4 06/23/2018 2129   ANC    Component Value Date/Time   NEUTROABS 6.0 06/23/2018 2129   Plt: Lab Results  Component Value Date   PLT 180 06/23/2018    Lactic Acid, Venous    Component Value Date/Time   LATICACIDVEN 2.02 (HH) 11/26/2014 1957      COVID-19 Labs  No results for input(s): DDIMER, FERRITIN, LDH, CRP in the last 72 hours.  Lab Results  Component Value Date   SARSCOV2NAA Not Detected 06/17/2018     HG/HCT  stable,  Down  from baseline see below    Component Value Date/Time   HGB 9.8 (L) 06/23/2018 2129   HCT 30.0 (L) 06/23/2018 2129  down from 15.6 in 2018         UA not ordered     ECG: not ordered    ED Triage Vitals  Enc Vitals Group     BP 06/23/18 2030 125/85     Pulse Rate 06/23/18 2030 (!) 103     Resp 06/23/18 2001 20     Temp 06/23/18 2001 99.6 F (37.6 C)     Temp Source 06/23/18 2001 Oral     SpO2 06/23/18 2030 100 %     Weight --      Height --      Head Circumference --      Peak Flow --      Pain Score 06/23/18 2003 9     Pain Loc --      Pain Edu? --      Excl. in GC? --    TMAX(24)@       Latest  Blood pressure 135/84, pulse 96, temperature 99.6 F (37.6 C), temperature source  Oral, resp. rate 20, SpO2 100 %.     Hospitalist was called for admission for Upper GI bleed    Review of Systems:    Pertinent positives include:   nausea, vomiting,melena,   Constitutional:  No weight loss, night sweats, Fevers, chills, fatigue, weight loss  HEENT:  No headaches, Difficulty swallowing,Tooth/dental problems,Sore throat,  No sneezing, itching, ear ache, nasal congestion, post nasal drip,  Cardio-vascular:  No chest pain, Orthopnea, PND, anasarca, dizziness, palpitations.no Bilateral lower extremity swelling  GI:  No heartburn, indigestion, abdominal pain, diarrhea, change in bowel habits, loss of appetite, blood in stool, hematemesis Resp:  no shortness of breath at rest. No dyspnea on exertion, No excess mucus, no productive cough, No non-productive cough, No coughing up of blood.No change in color of mucus.No wheezing. Skin:  no rash or lesions. No jaundice GU:  no dysuria, change in color of urine, no urgency or frequency. No straining to urinate.  No flank pain.  Musculoskeletal:  No joint pain or no joint swelling. No decreased range of motion. No back pain.  Psych:  No change in mood or affect. No depression or anxiety. No memory loss.  Neuro: no localizing neurological complaints, no tingling, no weakness, no double vision, no gait abnormality, no slurred speech, no confusion  All systems reviewed and apart from HOPI all are negative  Past Medical History:   Past Medical History:  Diagnosis Date  . Alcohol abuse   . Depression   . Hypertension   . PUD (peptic ulcer disease)       Past Surgical History:  Procedure Laterality Date  . BACK SURGERY    . 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   . TONSILLECTOMY      Social History:  Ambulatory   independently     reports that he has been smoking. He has never used smokeless tobacco. He reports current alcohol use. He reports that he does not use drugs.     Family History:   Family History  Problem Relation Age of Onset  . Heart disease Mother     Allergies: No Known Allergies   Prior to Admission medications   Medication Sig Start Date End Date Taking? Authorizing Provider  Aspirin-Acetaminophen-Caffeine (GOODYS EXTRA STRENGTH) 500-325-65 MG PACK Take 2 Packages by mouth [phone] as needed (headache).   Yes [provider]  lisinopril (PRINIVIL,ZESTRIL) 20 MG tablet Take 1 tablet (20 mg total) by mouth daily. 10/09/16  Yes Nedrud, Jeanella FlatteryMarybeth, MD  levETIRAcetam (KEPPRA) 500 MG tablet Take 1 tablet (500 mg total) by mouth 2 (two) times daily. Patient not taking: Reported on 06/23/2018 10/09/16 10/16/16  Rozann LeschesNedrud, Marybeth, MD  naproxen sodium (ANAPROX) 550 MG tablet Take 1 tablet (550 mg total) by mouth 2 (two) times daily as needed (back pain). Patient not taking: Reported on 06/23/2018 10/09/16   Rozann LeschesNedrud, Marybeth, MD   Physical Exam: Blood pressure 135/84, pulse 96, temperature 99.6 F (37.6 C), temperature source Oral, resp. rate 20, SpO2 100 %. 1. General:  in No  Acute distress   Chronically ill -appearing 2. Psychological: Alert and   Oriented 3. Head/ENT:     Dry Mucous Membranes                          Head multiple scars on the face neck supple  Poor Dentition 4. SKIN: n  decreased Skin turgor,  Skin clean Dry and intact no rash 5. Heart: Regular rate and rhythm no  Murmur, no Rub or gallop 6. Lungs: , no wheezes or crackles   7. Abdomen: Soft, diffuse tenderness worse in epigastric region , Non distended  bowel sounds present 8. Lower extremities: no clubbing, cyanosis, no  edema 9. Neurologically Grossly intact, moving all 4 extremities equally no tremor  10. MSK: Normal range of motion   All other LABS:     Recent Labs  Lab  06/23/18 2129  WBC 7.4  NEUTROABS 6.0  HGB 9.8*  HCT 30.0*  MCV 94.6  PLT 180     Recent Labs  Lab 06/23/18 2129  NA 138  K 2.8*  CL 100  CO2 27  GLUCOSE 93  BUN 29*  CREATININE 0.82  CALCIUM 9.9     Recent Labs  Lab 06/23/18 2129  AST 36  ALT 45*  ALKPHOS 58  BILITOT 1.2  PROT 7.7  ALBUMIN 4.3    Cultures:    Component Value Date/Time   SDES URINE, RANDOM 05/16/2009 1145   SPECREQUEST NONE 05/16/2009 1145   CULT NO GROWTH 05/16/2009 1145   REPTSTATUS 05/17/2009 FINAL 05/16/2009 1145     Radiological Exams on Admission: No results found.  Chart has been reviewed    Assessment/Plan  58 y.o. male with medical history significant of homelessness, alcohol abuse subdural hematoma status post head injury History of delirium tremens psychosis strep peptic ulcer disease anemia hypertension,  Admitted for Upper Gi bleed  Present on Admission:  . Upper GI bleed-  - [place] Blatch[place] score BUN >18.2  , Hg <13      Melena   >1 Justifies admission and aggressive management      Modifying risk factors include:    NSAIDS use hx of PUD alcohol abuse  Prior hx of GI bleed       -    AIMS 65 = Alb <3,  INR >1.5 Mental status change, SBP <90 (0-4) TOTAL of 0           reassuring      -  ER  Provider consulted  gastroenterology ( [facility]) dr. Wyman SongsterBevers    - serial CBC.    - Monitor for any recurrence,  evidence of hemodynamic instability or significant blood loss  - Transfuse as needed for hemoglobin below 7 or evidence of life-threatening bleeding  - Establish at least 2 PIV and fluid resuscitate   - clear liquids for tonight keep nothing by mouth post midnight,   -  administer Protonix  drip    . Alcohol abuse -hx  Of  alcohol withdrawal.  Ports last drink was on Thursday currently not tremulous.  Alcohol level less than 10 will monitor order CIWA protocol IV folic acid and thiamine . Anemia-secondary to blood loss will transfuse if continues to drop precipitously with  evidence of significant bleeding with hemoglobin below 7 . Hypokalemia - - will replace and repeat in AM,  check magnesium level and replace as needed Homelessness -consult social work Tobacco abuse spoke about importance of quitting Other plan as per orders.  DVT prophylaxis:  SCD     Code Status:  FULL CODE   Family Communication:   Family not at  Bedside    Disposition Plan:       To home once workup is complete and patient is stable  Social Work  consulted                                    Consults called:      ER paged [facility] Dr. Wyman Songster   Admission status:  ED Disposition    ED Disposition Condition Comment   Admit  [facility]ospital [facility] [[id number]]  Level of Care: Stepdown [14]  Admit to SDU based on following criteria: Other see comments  Comments: upper GI bleed and hx of EtoH abuse  Covid Evaluation: N/A  Diagnosis: Upper GI bleed [409811]  Admitting Physician: Therisa Doyne [[id number]]  Attending Physician: Therisa Doyne [[id number]]  PT Class (Do Not Modify): Observation [104]  PT Acc Code (Do Not Modify): Observation [10022]       Obs    Level of care      SDU tele indefinitely please discontinue once patient no longer qualifies  Precautions:   Droplet  Droplet precaution  PPE: Used by the provider:   P100  eye Goggles,  Gloves     Fauna Neuner 06/23/2018, 11:54 PM    [facility]     after 2 AM please page floor coverage PA If 7AM-7PM, please contact the day team taking care of the patient using [email or web address]

## 2018-06-23 NOTE — ED Triage Notes (Signed)
Patient presents with complaints of vomiting for 2 days post food, dark stools, headaches, abdominal pain and dizziness. He currently stays at a homeless shelter.   EMS vitals and CBG: 126/69 BP 110 HR 97% O2 sats on room air 115 CBG 97.3 temp

## 2018-06-23 NOTE — ED Provider Notes (Signed)
[facility] DEPT Provider Note   CSN: [id number] Arrival date & time: 06/23/18  1933    History   Chief Complaint Chief Complaint  Patient presents with   Abdominal Pain   Emesis   Headache    HPI Rodney Hartman is a 58 y.o. male.     HPI Pt states his stomach has been bothering him for several days.  He has been vomiting brown fluid, 3-4 times per day.  He also has been having dark stools.  He feels weak and is not able to keep anything.  He has been using goody powder.  He denies any alcohol use.   He has been cramping all over and feels lightheaded. No fevers.    Past Medical History:  Diagnosis Date   Alcohol abuse    Depression    Hypertension    PUD (peptic ulcer disease)     Patient Active Problem List   Diagnosis Date Noted   Hypokalemia 06/23/2018   Suicidal ideation    Substance induced mood disorder ([place])    Fall 10/07/2016   Subdural hematoma ([facility]) 10/07/2016   Atypical chest pain    Acute congestive heart failure ([place]) 08/12/2015   Anemia 08/12/2015   HTN (hypertension) 08/12/2015   Alcohol abuse with intoxication, uncomplicated ([place]) 18/29/9371   MDD (major depressive disorder), recurrent severe, without psychosis ([place]) 07/31/2015   Suicidal ideation 07/31/2015   Homicidal ideation 07/31/2015   Alcohol abuse with alcohol-induced mood disorder ([place]) 01/25/2015   Alcohol use disorder, severe, dependence ([place]) 12/17/2014   Alcohol abuse 08/07/2014    Past Surgical History:  Procedure Laterality Date   [place]   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    TONSILLECTOMY          Home Medications    Prior to Admission medications   Medication Sig Start Date End Date Taking? Authorizing Provider  Aspirin-Acetaminophen-Caffeine (GOODYS EXTRA STRENGTH) [phone] MG PACK Take 2 Packages by mouth daily as  needed (headache).   Yes [provider]  lisinopril (PRINIVIL,ZESTRIL) 20 MG tablet Take 1 tablet (20 mg total) by mouth daily. 10/09/16  Yes Nedrud, Larena Glassman, MD  levETIRAcetam (KEPPRA) 500 MG tablet Take 1 tablet (500 mg total) by mouth 2 (two) times daily. Patient not taking: Reported on 06/23/2018 10/09/16 10/16/16  Thomasene Ripple, MD  naproxen sodium (ANAPROX) 550 MG tablet Take 1 tablet (550 mg total) by mouth 2 (two) times daily as needed (back pain). Patient not taking: Reported on 06/23/2018 10/09/16   Thomasene Ripple, MD    Family History Family History  Problem Relation Age of Onset   Heart disease Mother     Social History Social History   Tobacco Use   Smoking status: Current Every Day Smoker   Smokeless tobacco: Never Used  Substance Use Topics   Alcohol use: Yes    Comment: every other day    Drug use: No     Allergies   Patient has no known allergies.   Review of Systems Review of Systems  All other systems reviewed and are negative.    Physical Exam Updated Vital Signs BP 135/84    Pulse 96    Temp 99.6 F (37.6 C) (Oral)    Resp 20    SpO2 100%   Physical Exam Vitals signs and nursing note reviewed.  Constitutional:  General: He is not in acute distress.    Appearance: He is well-developed.  HENT:     Head: Normocephalic and atraumatic.     Right Ear: External ear normal.     Left Ear: External ear normal.  Eyes:     General: No scleral icterus.       Right eye: No discharge.        Left eye: No discharge.     Conjunctiva/sclera: Conjunctivae normal.  Neck:     Musculoskeletal: Neck supple.     Trachea: No tracheal deviation.  Cardiovascular:     Rate and Rhythm: Normal rate and regular rhythm.  Pulmonary:     Effort: Pulmonary effort is normal. No respiratory distress.     Breath sounds: Normal breath sounds. No stridor. No wheezing or rales.  Abdominal:     General: Bowel sounds are normal. There is no distension.      Palpations: Abdomen is soft.     Tenderness: There is abdominal tenderness in the epigastric area. There is no guarding or rebound.  Genitourinary:    Comments: Dark stool on rectal exam Musculoskeletal:        General: No tenderness.  Skin:    General: Skin is warm and dry.     Findings: No rash.  Neurological:     Mental Status: He is alert.     Cranial Nerves: No cranial nerve deficit (no facial droop, extraocular movements intact, no slurred speech).     Sensory: No sensory deficit.     Motor: No abnormal muscle tone or seizure activity.     Coordination: Coordination normal.      ED Treatments / Results  Labs (all labs ordered are listed, but only abnormal results are displayed) Labs Reviewed  COMPREHENSIVE METABOLIC PANEL - Abnormal; Notable for the following components:      Result Value   Potassium 2.8 (*)    BUN 29 (*)    ALT 45 (*)    All other components within normal limits  CBC WITH DIFFERENTIAL/PLATELET - Abnormal; Notable for the following components:   RBC 3.17 (*)    Hemoglobin 9.8 (*)    HCT 30.0 (*)    All other components within normal limits  POC OCCULT BLOOD, ED - Abnormal; Notable for the following components:   Fecal Occult Bld POSITIVE (*)    All other components within normal limits  SARS CORONAVIRUS 2 (HOSPITAL ORDER, PERFORMED IN [facility])  APTT  PROTIME-INR  ETHANOL  TYPE AND SCREEN  ABO/RH    EKG None  Radiology No results found.  Procedures .Critical Care Performed by: Linwood DibblesKnapp, Beanca Kiester, MD Authorized by: Linwood DibblesKnapp, Chasya Zenz, MD   Critical care provider statement:    Critical care time (minutes):  30   Critical care was time spent personally by me on the following activities:  Discussions with consultants, evaluation of patient's response to treatment, examination of patient, ordering and performing treatments and interventions, ordering and review of laboratory studies, ordering and review of radiographic studies, pulse  oximetry, re-evaluation of patient's condition, obtaining history from patient or surrogate and review of old charts   (including critical care time)  Medications Ordered in ED Medications  sodium chloride 0.9 % bolus 1,000 mL (1,000 mLs Intravenous New Bag/Given 06/23/18 2130)    And  0.9 %  sodium chloride infusion ( Intravenous New Bag/Given 06/23/18 2144)  pantoprazole (PROTONIX) injection 40 mg (has no administration in time range)  potassium chloride 10 mEq  in 100 mL IVPB (has no administration in time range)  morphine 4 MG/ML injection 4 mg (has no administration in time range)     Initial Impression / Assessment and Plan / ED Course  I have reviewed the triage vital signs and the nursing notes.  Pertinent labs & imaging results that were available during my care of the patient were reviewed by me and considered in my medical decision making (see chart for details).  Clinical Course as of Jun 22 2244  Mon Jun 23, 2018  2225 Labs notable for hypokalemia.  Hgb decreased from previous.  Occult stool is positive.  Consistent with GI bleed   [JK]    Clinical Course User Index [JK] Linwood DibblesKnapp, Jessee Mezera, MD     Patient presented to the emergency room for evaluation of abdominal pain and dark stools.  Patient's ED work-up is consistent with an upper GI bleed.  He has a drop in hemoglobin associated with dark melanotic stools.  Patient is also hypokalemic.  Patient is given IV potassium and Protonix.  He has remained hemodynamic is stable.  Plan admission to the hospital for further treatment evaluation.  I will consult with gastroenterology.  Final Clinical Impressions(s) / ED Diagnoses   Final diagnoses:  UGI bleed  Hypokalemia      Linwood DibblesKnapp, Madina Galati, MD 06/23/18 2246

## 2018-06-23 NOTE — ED Notes (Signed)
Bed: AT55 Expected date:  Expected time:  Means of arrival:  Comments: 58 yo M/Abd pain

## 2018-06-24 ENCOUNTER — Encounter (HOSPITAL_COMMUNITY)
Admission: EM | Disposition: A | Discharge status: Home / Self Care | Payer: Self-pay | Source: Home / Self Care | Attending: Internal Medicine

## 2018-06-24 ENCOUNTER — Observation Stay (HOSPITAL_COMMUNITY): Payer: Self-pay | Admitting: Certified Registered Nurse Anesthetist

## 2018-06-24 ENCOUNTER — Encounter (HOSPITAL_COMMUNITY): Payer: Self-pay | Admitting: *Deleted

## 2018-06-24 DIAGNOSIS — K921 Melena: Secondary | ICD-10-CM

## 2018-06-24 DIAGNOSIS — D62 Acute posthemorrhagic anemia: Secondary | ICD-10-CM

## 2018-06-24 DIAGNOSIS — R1110 Vomiting, unspecified: Secondary | ICD-10-CM

## 2018-06-24 DIAGNOSIS — K250 Acute gastric ulcer with hemorrhage: Secondary | ICD-10-CM

## 2018-06-24 DIAGNOSIS — K920 Hematemesis: Secondary | ICD-10-CM

## 2018-06-24 DIAGNOSIS — R112 Nausea with vomiting, unspecified: Secondary | ICD-10-CM

## 2018-06-24 HISTORY — PX: BIOPSY: SHX5522

## 2018-06-24 HISTORY — PX: ESOPHAGOGASTRODUODENOSCOPY: SHX5428

## 2018-06-24 LAB — COMPREHENSIVE METABOLIC PANEL
ALT: 36 U/L (ref 0–44)
AST: 30 U/L (ref 15–41)
Albumin: 3.3 g/dL — ABNORMAL LOW (ref 3.5–5.0)
Alkaline Phosphatase: 44 U/L (ref 38–126)
Anion gap: 9 (ref 5–15)
BUN: 23 mg/dL — ABNORMAL HIGH (ref 6–20)
CO2: 23 mmol/L (ref 22–32)
Calcium: 8.8 mg/dL — ABNORMAL LOW (ref 8.9–10.3)
Chloride: 107 mmol/L (ref 98–111)
Creatinine, Ser: 0.74 mg/dL (ref 0.61–1.24)
GFR calc Af Amer: >60 mL/min (ref >60)
GFR calc non Af Amer: >60 mL/min (ref >60)
Glucose, Bld: 95 mg/dL (ref 70–99)
Potassium: 3 mmol/L — ABNORMAL LOW (ref 3.5–5.1)
Sodium: 139 mmol/L (ref 135–145)
Total Bilirubin: 0.9 mg/dL (ref 0.3–1.2)
Total Protein: 5.9 g/dL — ABNORMAL LOW (ref 6.5–8.1)

## 2018-06-24 LAB — CBC
HCT: 26.1 % — ABNORMAL LOW (ref 39.0–52.0)
HCT: 23.9 % — ABNORMAL LOW (ref 39.0–52.0)
HCT: 27.7 % — ABNORMAL LOW (ref 39.0–52.0)
HCT: 23.8 % — ABNORMAL LOW (ref 39.0–52.0)
Hemoglobin: 8.3 g/dL — ABNORMAL LOW (ref 13.0–17.0)
Hemoglobin: 7.7 g/dL — ABNORMAL LOW (ref 13.0–17.0)
Hemoglobin: 8.9 g/dL — ABNORMAL LOW (ref 13.0–17.0)
Hemoglobin: 7.6 g/dL — ABNORMAL LOW (ref 13.0–17.0)
MCH: 30.4 pg (ref 26.0–34.0)
MCH: 30.8 pg (ref 26.0–34.0)
MCH: 31.9 pg (ref 26.0–34.0)
MCH: 32.1 pg (ref 26.0–34.0)
MCHC: 31.8 g/dL (ref 30.0–36.0)
MCHC: 32.2 g/dL (ref 30.0–36.0)
MCHC: 32.1 g/dL (ref 30.0–36.0)
MCHC: 31.9 g/dL (ref 30.0–36.0)
MCV: 95.6 fL (ref 80.0–100.0)
MCV: 95.6 fL (ref 80.0–100.0)
MCV: 99.3 fL (ref 80.0–100.0)
MCV: 100.4 fL — ABNORMAL HIGH (ref 80.0–100.0)
Platelets: 162 10*3/uL (ref 150–400)
Platelets: 158 10*3/uL (ref 150–400)
Platelets: 166 10*3/uL (ref 150–400)
Platelets: 158 10*3/uL (ref 150–400)
RBC: 2.73 MIL/uL — ABNORMAL LOW (ref 4.22–5.81)
RBC: 2.5 MIL/uL — ABNORMAL LOW (ref 4.22–5.81)
RBC: 2.79 MIL/uL — ABNORMAL LOW (ref 4.22–5.81)
RBC: 2.37 MIL/uL — ABNORMAL LOW (ref 4.22–5.81)
RDW: 14.8 % (ref 11.5–15.5)
RDW: 14.8 % (ref 11.5–15.5)
RDW: 15.1 % (ref 11.5–15.5)
RDW: 15.4 % (ref 11.5–15.5)
WBC: 4.6 10*3/uL (ref 4.0–10.5)
WBC: 4.1 10*3/uL (ref 4.0–10.5)
WBC: 4 10*3/uL (ref 4.0–10.5)
WBC: 4 10*3/uL (ref 4.0–10.5)
nRBC: 0 % (ref 0.0–0.2)
nRBC: 0 % (ref 0.0–0.2)
nRBC: 0 % (ref 0.0–0.2)
nRBC: 0 % (ref 0.0–0.2)

## 2018-06-24 LAB — MAGNESIUM
Magnesium: 1.9 mg/dL (ref 1.7–2.4)
Magnesium: 1.8 mg/dL (ref 1.7–2.4)

## 2018-06-24 LAB — SARS CORONAVIRUS 2 BY RT PCR (HOSPITAL ORDER, PERFORMED IN ~~LOC~~ HOSPITAL LAB): SARS Coronavirus 2: NEGATIVE

## 2018-06-24 LAB — RAPID URINE DRUG SCREEN, HOSP PERFORMED
Amphetamines: NOT DETECTED
Barbiturates: NOT DETECTED
Benzodiazepines: NOT DETECTED
Cocaine: NOT DETECTED
Opiates: POSITIVE — AB
Tetrahydrocannabinol: NOT DETECTED

## 2018-06-24 LAB — HIV ANTIBODY (ROUTINE TESTING W REFLEX): HIV Screen 4th Generation wRfx: NONREACTIVE

## 2018-06-24 LAB — PHOSPHORUS
Phosphorus: 3.2 mg/dL (ref 2.5–4.6)
Phosphorus: 3.3 mg/dL (ref 2.5–4.6)

## 2018-06-24 LAB — TSH: TSH: 1.367 u[IU]/mL (ref 0.350–4.500)

## 2018-06-24 SURGERY — EGD (ESOPHAGOGASTRODUODENOSCOPY)
Anesthesia: Monitor Anesthesia Care

## 2018-06-24 MED ORDER — LACTATED RINGERS IV SOLN
INTRAVENOUS | Status: DC | PRN
  Administered 2018-06-24: 13:00:00 via INTRAVENOUS

## 2018-06-24 MED ORDER — LACTATED RINGERS IV SOLN
INTRAVENOUS | Status: DC
  Administered 2018-06-24: 13:00:00 via INTRAVENOUS

## 2018-06-24 MED ORDER — PROPOFOL 10 MG/ML IV BOLUS
INTRAVENOUS | Status: AC
  Filled 2018-06-24: qty 60

## 2018-06-24 MED ORDER — THIAMINE HCL 100 MG/ML IJ SOLN
100.0000 mg | Freq: Every day | INTRAMUSCULAR | Status: DC
  Administered 2018-06-24 – 2018-06-25 (×2): 100 mg via INTRAVENOUS
  Filled 2018-06-24 (×2): qty 2

## 2018-06-24 MED ORDER — ONDANSETRON HCL 4 MG/2ML IJ SOLN: 4.0000 mg | Freq: Four times a day (QID) | INTRAMUSCULAR | Status: DC | PRN

## 2018-06-24 MED ORDER — POTASSIUM CHLORIDE IN NACL 40-0.9 MEQ/L-% IV SOLN
INTRAVENOUS | Status: DC
  Administered 2018-06-24: 09:00:00 100 mL/h via INTRAVENOUS
  Filled 2018-06-24 (×3): qty 1000

## 2018-06-24 MED ORDER — POTASSIUM CHLORIDE 10 MEQ/100ML IV SOLN
10.0000 meq | INTRAVENOUS | Status: AC
  Administered 2018-06-24 (×4): 10 meq via INTRAVENOUS
  Filled 2018-06-24 (×4): qty 100

## 2018-06-24 MED ORDER — LIDOCAINE 2% (20 MG/ML) 5 ML SYRINGE
INTRAMUSCULAR | Status: DC | PRN
  Administered 2018-06-24: 100 mg via INTRAVENOUS

## 2018-06-24 MED ORDER — FOLIC ACID 5 MG/ML IJ SOLN
1.0000 mg | Freq: Every day | INTRAMUSCULAR | Status: DC
  Administered 2018-06-24 – 2018-06-25 (×2): 1 mg via INTRAVENOUS
  Filled 2018-06-24 (×2): qty 0.2

## 2018-06-24 MED ORDER — PROPOFOL 500 MG/50ML IV EMUL
INTRAVENOUS | Status: DC | PRN
  Administered 2018-06-24: 100 ug/kg/min via INTRAVENOUS

## 2018-06-24 MED ORDER — SODIUM CHLORIDE 0.9 % IV SOLN: INTRAVENOUS | Status: DC

## 2018-06-24 MED ORDER — ONDANSETRON HCL 4 MG PO TABS: 4.0000 mg | ORAL_TABLET | Freq: Four times a day (QID) | ORAL | Status: DC | PRN

## 2018-06-24 MED ORDER — ACETAMINOPHEN 325 MG PO TABS: 650.0000 mg | ORAL_TABLET | Freq: Four times a day (QID) | ORAL | Status: DC | PRN

## 2018-06-24 MED ORDER — BOOST / RESOURCE BREEZE PO LIQD CUSTOM
1.0000 | Freq: Three times a day (TID) | ORAL | Status: DC
  Administered 2018-06-24 – 2018-06-26 (×4): 1 via ORAL

## 2018-06-24 MED ORDER — HYDROCODONE-ACETAMINOPHEN 5-325 MG PO TABS
1.0000 | ORAL_TABLET | ORAL | Status: DC | PRN
  Administered 2018-06-24: 1 via ORAL
  Administered 2018-06-24 – 2018-06-25 (×3): 2 via ORAL
  Administered 2018-06-26: 09:00:00 1 via ORAL
  Filled 2018-06-24: qty 2
  Filled 2018-06-24: qty 1
  Filled 2018-06-24 (×3): qty 2

## 2018-06-24 MED ORDER — ZOLPIDEM TARTRATE 5 MG PO TABS
5.0000 mg | ORAL_TABLET | Freq: Every evening | ORAL | Status: DC | PRN
  Administered 2018-06-24 – 2018-06-25 (×2): 5 mg via ORAL
  Filled 2018-06-24 (×2): qty 1

## 2018-06-24 MED ORDER — PANTOPRAZOLE SODIUM 40 MG IV SOLR
40.0000 mg | Freq: Two times a day (BID) | INTRAVENOUS | Status: DC
  Administered 2018-06-24 – 2018-06-25 (×2): 40 mg via INTRAVENOUS
  Filled 2018-06-24 (×2): qty 40

## 2018-06-24 MED ORDER — ONDANSETRON HCL 4 MG/2ML IJ SOLN
INTRAMUSCULAR | Status: DC | PRN
  Administered 2018-06-24: 4 mg via INTRAVENOUS

## 2018-06-24 MED ORDER — PROPOFOL 10 MG/ML IV BOLUS
INTRAVENOUS | Status: DC | PRN
  Administered 2018-06-24 (×2): 30 mg via INTRAVENOUS

## 2018-06-24 MED ORDER — ACETAMINOPHEN 650 MG RE SUPP: 650.0000 mg | Freq: Four times a day (QID) | RECTAL | Status: DC | PRN

## 2018-06-24 NOTE — Interval H&P Note (Signed)
History and Physical Interval Note:  06/24/2018 1:05 PM  Rodney Hartman  has presented today for surgery, with the diagnosis of hematemesis.  The various methods of treatment have been discussed with the patient and family. After consideration of risks, benefits and other options for treatment, the patient has consented to  Procedure(s): ESOPHAGOGASTRODUODENOSCOPY (EGD) (N/A) as a surgical intervention.  The patient's history has been reviewed, patient examined, no change in status, stable for surgery.  I have reviewed the patient's chart and labs.  Questions were answered to the patient's satisfaction.     Pricilla Riffle. [place] Plan

## 2018-06-24 NOTE — ED Notes (Signed)
ED TO INPATIENT HANDOFF REPORT  Name/Age/Gender Rodney Hartman 58 y.o. male  Code Status Code Status History    Date Active Date Inactive Code Status Order ID Comments User Context   11/12/2016 1625 11/15/2016 1633 Full Code [id number]  Duffy Bruce, MD ED   10/16/2016 0109 10/16/2016 1653 Full Code [id number]  Charlann Lange, PA-C ED   10/07/2016 1658 10/09/2016 2013 Full Code [id number]  Valinda Party, DO ED   01/22/2016 0855 01/23/2016 2127 Full Code [id number]  Dalia Heading, PA-C ED   08/12/2015 1137 08/15/2015 1903 Full Code [id number]  Samella Parr, NP Inpatient   08/01/2015 1130 08/09/2015 1347 Full Code [id number]  Niel Hummer, NP Inpatient   07/30/2015 1604 08/01/2015 1130 Full Code [id number]  Lurena Nida, NP Inpatient   01/25/2015 0016 01/28/2015 1953 Full Code [id number]  Laverle Hobby, PA-C Inpatient   01/24/2015 0253 01/25/2015 0016 Full Code [id number]  Ripley Fraise, MD ED   01/03/2015 0026 01/03/2015 1435 Full Code [id number]  Camprubi-Soms, Dewitt Hoes, PA-C ED   12/20/2014 1826 12/22/2014 2239 Full Code [id number]  Lavina Hamman, MD ED   12/17/2014 0201 12/20/2014 1826 Full Code [id number]  Junius Creamer, NP ED   11/26/2014 2309 11/29/2014 1556 Full Code [id number]  Rolm Bookbinder, MD Inpatient   08/07/2014 1658 08/12/2014 1636 Full Code [id number]  Benjamine Mola, Garner Inpatient   08/06/2014 2132 08/07/2014 1658 Full Code [id number]  Malvin Johns, MD ED   Advance Care Planning Activity      Home/SNF/Other Home  Chief Complaint GI issues  Level of Care/Admitting Diagnosis ED Disposition    ED Disposition Condition [facility] Area: [facility] [[id number]]  Level of Care: Stepdown [14]  Admit to SDU based on following criteria: Other see comments  Comments: upper GI bleed and hx of EtoH abuse  Covid Evaluation: N/A  Diagnosis: Upper GI bleed [174081]  Admitting Physician: Toy Baker [[id number]]  Attending Physician:  Toy Baker [[id number]]  PT Class (Do Not Modify): Observation [104]  PT Acc Code (Do Not Modify): Observation [10022]       Medical History Past Medical History:  Diagnosis Date  . Alcohol abuse   . Depression   . Hypertension   . PUD (peptic ulcer disease)     Allergies No Known Allergies  IV Location/Drains/Wounds Patient Lines/Drains/Airways Status   Active Line/Drains/Airways    Name:   Placement date:   Placement time:   Site:   Days:   Peripheral IV 06/23/18 Right Forearm   06/23/18    2124    Forearm   1   Peripheral IV 06/24/18 Left Forearm   06/24/18    0010    Forearm   less than 1          Labs/Imaging Results for orders placed or performed during the hospital encounter of 06/23/18 (from the past 48 hour(s))  POC occult blood, ED     Status: Abnormal   Collection Time: 06/23/18  9:12 PM  Result Value Ref Range   Fecal Occult Bld POSITIVE (A) NEGATIVE  Comprehensive metabolic panel     Status: Abnormal   Collection Time: 06/23/18  9:29 PM  Result Value Ref Range   Sodium 138 135 - 145 mmol/L   Potassium 2.8 (L) 3.5 - 5.1 mmol/L   Chloride 100 98 - 111 mmol/L   CO2 27 22 - 32 mmol/L   Glucose, Bld 93 70 - 99 mg/dL   BUN  29 (H) 6 - 20 mg/dL   Creatinine, Ser 1.610.82 0.61 - 1.24 mg/dL   Calcium 9.9 8.9 - 09.610.3 mg/dL   Total Protein 7.7 6.5 - 8.1 g/dL   Albumin 4.3 3.5 - 5.0 g/dL   AST 36 15 - 41 U/L   ALT 45 (H) 0 - 44 U/L   Alkaline Phosphatase 58 38 - 126 U/L   Total Bilirubin 1.2 0.3 - 1.2 mg/dL   GFR calc non Af Amer >60 >60 mL/min   GFR calc Af Amer >60 >60 mL/min   Anion gap 11 5 - 15    Comment: Performed at [facility], [address] [place], [place] [address]  CBC WITH DIFFERENTIAL     Status: Abnormal   Collection Time: 06/23/18  9:29 PM  Result Value Ref Range   WBC 7.4 4.0 - 10.5 K/uL   RBC 3.17 (L) 4.22 - 5.81 MIL/uL   Hemoglobin 9.8 (L) 13.0 - 17.0 g/dL   HCT 98.130.0 (L) 19.139.0 - 47.852.0 %   MCV 94.6 80.0 - 100.0 fL    MCH 30.9 26.0 - 34.0 pg   MCHC 32.7 30.0 - 36.0 g/dL   RDW 29.514.6 62.111.5 - 30.815.5 %   Platelets 180 150 - 400 K/uL   nRBC 0.0 0.0 - 0.2 %   Neutrophils Relative % 82 %   Neutro Abs 6.0 1.7 - 7.7 K/uL   Lymphocytes Relative 10 %   Lymphs Abs 0.8 0.7 - 4.0 K/uL   Monocytes Relative 7 %   Monocytes Absolute 0.5 0.1 - 1.0 K/uL   Eosinophils Relative 1 %   Eosinophils Absolute 0.1 0.0 - 0.5 K/uL   Basophils Relative 0 %   Basophils Absolute 0.0 0.0 - 0.1 K/uL   Immature Granulocytes 0 %   Abs Immature Granulocytes 0.02 0.00 - 0.07 K/uL    Comment: Performed at [facility], [address] [place], [place] [address]  APTT     Status: None   Collection Time: 06/23/18  9:29 PM  Result Value Ref Range   aPTT 27 24 - 36 seconds    Comment: Performed at [facility], [address] [place], [place] [address]  Protime-INR     Status: None   Collection Time: 06/23/18  9:29 PM  Result Value Ref Range   Prothrombin Time 13.2 11.4 - 15.2 seconds   INR 1.0 0.8 - 1.2    Comment: (NOTE) INR goal varies based on device and disease states. Performed at [facility], [address] [place], [place] [address]   Type and screen     Status: None   Collection Time: 06/23/18  9:29 PM  Result Value Ref Range   ABO/RH(D) A POS    Antibody Screen NEG    Sample Expiration      06/26/2018,2359 Performed at [facility], [address] [place], [place] [address]   Ethanol     Status: None   Collection Time: 06/23/18  9:29 PM  Result Value Ref Range   Alcohol, Ethyl (B) <10 <10 mg/dL    Comment: (NOTE) Lowest detectable limit for serum alcohol is 10 mg/dL. For medical purposes only. Performed at [facility], [address] [place], [place] [address]   ABO/Rh     Status: None   Collection Time: 06/23/18  9:29 PM  Result Value Ref Range   ABO/RH(D)      A POS Performed at [facility], [address] [place], [place] [address]    No results found.  Pending Labs Unresulted Labs (From admission, onward)    Start     Ordered   06/23/18 2356  Urine rapid drug screen (hosp performed)  ONCE - STAT,   STAT     06/23/18 2355   06/23/18 2334  Magnesium  Add-on,   AD     06/23/18 2334   06/23/18 2334  Phosphorus  Add-on,   AD     06/23/18 2334   06/23/18 2229  SARS Coronavirus 2 (CEPHEID - Performed in [facility] hospital lab), Hosp Order  (Asymptomatic Patients Labs)  Once,   STAT    Question:  Rule Out  Answer:  Yes   06/23/18 2228   Signed and Held  HIV antibody (Routine Testing)  Tomorrow morning,   R     Signed and Held   Signed and Held  Magnesium  Tomorrow morning,   R    Comments: Call MD if <1.5    Signed and Held   Signed and Held  Phosphorus  Tomorrow morning,   R     Signed and Held   Signed and Held  TSH  Once,   R    Comments: Cancel if already done within 1 month and notify MD    Signed and Held   Signed and Held  Comprehensive metabolic panel  Once,   R    Comments: Cal MD for K<3.5 or >5.0    Signed and Held   Signed and Held  CBC  Once,   R    Comments: Call for hg <8.0    Signed and Held   Signed and Held  CBC  Now then every 6 hours,   R     Signed and Held          Vitals/Pain Today's Vitals   06/23/18 2030 06/23/18 2130 06/23/18 2200 06/23/18 2330  BP: 125/85 131/87 135/84 (!) 129/91  Pulse: (!) 103 96 96 88  Resp:    18  Temp:      TempSrc:      SpO2: 100% 100% 100% 100%  PainSc:        Isolation Precautions Droplet precaution  Medications Medications  sodium chloride 0.9 % bolus 1,000 mL (0 mLs Intravenous Stopped 06/23/18 2255)    And  0.9 %  sodium chloride infusion ( Intravenous New Bag/Given 06/23/18 2144)  morphine 4 MG/ML injection 4 mg (has no administration in time range)  potassium chloride 10 mEq in 100 mL IVPB (10 mEq Intravenous New Bag/Given 06/24/18 0007)  pantoprazole (PROTONIX) 80 mg in sodium  chloride 0.9 % 250 mL (0.32 mg/mL) infusion (has no administration in time range)  pantoprazole (PROTONIX) injection 40 mg (has no administration in time range)  pantoprazole (PROTONIX) injection 40 mg (has no administration in time range)  pantoprazole (PROTONIX) injection 40 mg (40 mg Intravenous Given 06/23/18 2247)  potassium chloride 10 mEq in 100 mL IVPB (0 mEq Intravenous Stopped 06/23/18 2354)    Mobility walks

## 2018-06-24 NOTE — Op Note (Addendum)
[facility] Patient Name: Rodney Hartman Procedure Date: 06/24/2018 MRN: [id number] Attending MD: Meryl DareMalcolm T Kden Wagster , MD Date of Birth: Oct 17, 1960 CSN: [id number] Age: 58 Admit Type: Inpatient Procedure:                Upper GI endoscopy Indications:              Hematemesis, N/V, Melena, Suspected upper                            gastrointestinal bleeding Providers:                Venita LickMalcolm T. Russella DarStark, MD, Glory RosebushJulie Gibson, RN, Beryle BeamsJanie                            Billups, Technician, Kym GroomKaren Walker, CRNA Referring MD:             [facility] Medicines:                Monitored Anesthesia Care Complications:            No immediate complications. Estimated Blood Loss:     Estimated blood loss was minimal. Procedure:                Pre-Anesthesia Assessment:                           - Prior to the procedure, a History and Physical                            was performed, and patient medications and                            allergies were reviewed. The patient's tolerance of                            previous anesthesia was also reviewed. The risks                            and benefits of the procedure and the sedation                            options and risks were discussed with the patient.                            All questions were answered, and informed consent                            was obtained. Prior Anticoagulants: The patient has                            taken no previous anticoagulant or antiplatelet                            agents. ASA Grade Assessment: II - A patient with  mild systemic disease. After reviewing the risks                            and benefits, the patient was deemed in                            satisfactory condition to undergo the procedure.                           After obtaining informed consent, the endoscope was                            passed under direct vision. Throughout the             procedure, the patient's blood pressure, pulse, and                            oxygen saturations were monitored continuously. The                            GIF-H190 ([id number]) Olympus gastroscope was                            introduced through the mouth, and advanced to the                            second part of duodenum. The upper GI endoscopy was                            accomplished without difficulty. The patient                            tolerated the procedure well. Scope In: Scope Out: Findings:      LA Grade B (one or more mucosal breaks greater than 5 mm, not extending       between the tops of two mucosal folds) esophagitis with no bleeding was       found in the distal esophagus.      The exam of the esophagus was otherwise normal.      One non-bleeding cratered gastric ulcer with no stigmata of bleeding was       found in the prepyloric region of the stomach. The lesion was 8 mm in       largest dimension.      A few localized, small non-bleeding erosions were found in the gastric       antrum. There were no stigmata of recent bleeding. Biopsies were taken       with a cold forceps for histology.      Patchy moderately erythematous mucosa without bleeding was found in the       gastric body and in the gastric antrum. Biopsies were taken with a cold       forceps for histology.      The exam of the stomach was otherwise normal.      The duodenal bulb and second portion of the duodenum were normal. Impression:               -  LA Grade B reflux esophagitis.                           - Non-bleeding gastric ulcer with no stigmata of                            bleeding.                           - Non-bleeding erosive gastropathy. Biopsied.                           - Erythematous mucosa in the gastric body and                            antrum. Biopsied.                           - Normal duodenal bulb and second portion of the                             duodenum. Moderate Sedation:      Not Applicable - Patient had care per Anesthesia. Recommendation:           - Patient has a contact number available for                            emergencies. The signs and symptoms of potential                            delayed complications were discussed with the                            patient. Return to normal activities tomorrow.                            Written discharge instructions were provided to the                            patient.                           - Return patient to hospital ward for ongoing care.                           - Full liquid diet today, then advance as tolerated.                           - Change to PPI IV bid for 1-2 days, then PPI po                            bid for 1 month, then PPI qam long term.                           - Await pathology results.                           -  No aspirin, ibuprofen, naproxen, or other                            non-steroidal anti-inflammatory drugs long term. Procedure Code(s):        --- Professional ---                           [phone], Esophagogastroduodenoscopy, flexible,                            transoral; with biopsy, single or multiple Diagnosis Code(s):        --- Professional ---                           K21.0, Gastro-esophageal reflux disease with                            esophagitis                           K25.9, Gastric ulcer, unspecified as acute or                            chronic, without hemorrhage or perforation                           K31.89, Other diseases of stomach and duodenum                           K92.0, Hematemesis                           K92.1, Melena (includes Hematochezia) CPT copyright 2019 [employer]. All rights reserved. The codes documented in this report are preliminary and upon coder review may  be revised to meet current compliance requirements. Meryl DareMalcolm T Toniesha Zellner, MD 06/24/2018 1:43:02 PM This report  has been signed electronically. Number of Addenda: 0

## 2018-06-24 NOTE — Transfer of Care (Signed)
Immediate Anesthesia Transfer of Care Note  Patient: Rodney Hartman  Procedure(s) Performed: ESOPHAGOGASTRODUODENOSCOPY (EGD) (N/A ) BIOPSY  Patient Location: PACU and Endoscopy Unit  Anesthesia Type:MAC  Level of Consciousness: awake, alert , oriented and patient cooperative  Airway & Oxygen Therapy: Patient Spontanous Breathing and Patient connected to nasal cannula oxygen  Post-op Assessment: Report given to RN and Post -op Vital signs reviewed and stable  Post vital signs: Reviewed and stable  Last Vitals:  Vitals Value Taken Time  BP    Temp    Pulse    Resp 16 06/24/18 1342  SpO2    Vitals shown include unvalidated device data.  Last Pain:  Vitals:   06/24/18 1245  TempSrc: Temporal  PainSc: 0-No pain      Patients Stated Pain Goal: 5 (24/09/73 5329)  Complications: No apparent anesthesia complications

## 2018-06-24 NOTE — Progress Notes (Signed)
PROGRESS NOTE    Rodney HitchWillie N Seaborn  ZOX:[id number] DOB: [birth year] DOA: 06/23/2018 PCP: Patient, No Pcp Per   Brief Narrative:  Rodney Hartman is a 58 y.o. male with medical history significant of homelessness, alcohol abuse subdural hematoma status post head injury, delirium tremens psychosis strep peptic ulcer disease anemia and hypertension presented to ER on 06/23/2018 with complaint of 2-day history of nausea and vomiting of dark-colored vomitus as well as dark-colored stools and abdominal pain.  Patient has been using Goody powder to help his stomach pains.  He also drinks few alcoholic drinks per week and he claimed that his last drink was Thursday.  He also has a remote history of peptic ulcer disease about 20 years ago at some outside facility.  He was on to be anemic with hemoglobin over 8 with possible diagnosis of upper GI bleed and admitted under hospitalist service and GI has been consulted.  Patient is scheduled to have EGD today.  Consultants:   GI  Procedures:   None  Antimicrobials:   None   Subjective: Patient seen and examined.  He complains of continued abdominal pain which is on and off but slightly better than yesterday.  No more nausea or vomiting at this point in time however he claims that he has had 1 vomiting since admission and there was also brown in color.  Objective: Vitals:   06/24/18 0100 06/24/18 0152 06/24/18 0400 06/24/18 0801  BP: 134/86 139/77  125/86  Pulse: 89 84 91 88  Resp: 20 20  16   Temp:  98.7 F (37.1 C)  98.9 F (37.2 C)  TempSrc:    Oral  SpO2: 100% 100%  100%    Intake/Output Summary (Last 24 hours) at 06/24/2018 0924 Last data filed at 06/24/2018 0600 Gross per 24 hour  Intake 2623.77 ml  Output -  Net 2623.77 ml   There were no vitals filed for this visit.  Examination:  General exam: Appears calm and comfortable  Respiratory system: Clear to auscultation. Respiratory effort normal. Cardiovascular system: S1 & S2 heard,  RRR. No JVD, murmurs, rubs, gallops or clicks. No pedal edema. Gastrointestinal system: Abdomen is nondistended, soft and epigastric and right upper quadrant tenderness, no organomegaly or masses felt. Normal bowel sounds heard. Central nervous system: Alert and oriented. No focal neurological deficits. Extremities: Symmetric 5 x 5 power. Skin: No rashes, lesions or ulcers Psychiatry: Judgement and insight appear normal. Mood & affect appropriate.    Data Reviewed: I have personally reviewed following labs and imaging studies  CBC: Recent Labs  Lab 06/23/18 2129 06/24/18 0521  WBC 7.4 4.6  NEUTROABS 6.0  --   HGB 9.8* 8.3*  HCT 30.0* 26.1*  MCV 94.6 95.6  PLT 180 162   Basic Metabolic Panel: Recent Labs  Lab 06/23/18 2129 06/23/18 2334 06/24/18 0521  NA 138  --  139  K 2.8*  --  3.0*  CL 100  --  107  CO2 27  --  23  GLUCOSE 93  --  95  BUN 29*  --  23*  CREATININE 0.82  --  0.74  CALCIUM 9.9  --  8.8*  MG  --  1.9 1.8  PHOS  --  3.2 3.3   GFR: CrCl cannot be calculated (Unknown ideal weight.). Liver Function Tests: Recent Labs  Lab 06/23/18 2129 06/24/18 0521  AST 36 30  ALT 45* 36  ALKPHOS 58 44  BILITOT 1.2 0.9  PROT 7.7 5.9*  ALBUMIN 4.3  3.3*   No results for input(s): LIPASE, AMYLASE in the last 168 hours. No results for input(s): AMMONIA in the last 168 hours. Coagulation Profile: Recent Labs  Lab 06/23/18 2129  INR 1.0   Cardiac Enzymes: No results for input(s): CKTOTAL, CKMB, CKMBINDEX, TROPONINI in the last 168 hours. BNP (last 3 results) No results for input(s): PROBNP in the last 8760 hours. HbA1C: No results for input(s): HGBA1C in the last 72 hours. CBG: No results for input(s): GLUCAP in the last 168 hours. Lipid Profile: No results for input(s): CHOL, HDL, LDLCALC, TRIG, CHOLHDL, LDLDIRECT in the last 72 hours. Thyroid Function Tests: Recent Labs    06/24/18 0521  TSH 1.367   Anemia Panel: No results for input(s):  VITAMINB12, FOLATE, FERRITIN, TIBC, IRON, RETICCTPCT in the last 72 hours. Sepsis Labs: No results for input(s): PROCALCITON, LATICACIDVEN in the last 168 hours.  Recent Results (from the past 240 hour(s))  Novel Coronavirus, NAA ([facility])     Status: None   Collection Time: 06/17/18 12:00 AM  Result Value Ref Range Status   SARS-CoV-2, NAA Not Detected Not Detected Final    Comment: This test was developed and its performance characteristics determined by [facility]. This test has not been FDA cleared or approved. This test has been authorized by FDA under an Emergency Use Authorization (EUA). This test is only authorized for the duration of time the declaration that circumstances exist justifying the authorization of the emergency use of in vitro diagnostic tests for detection of SARS-CoV-2 virus and/or diagnosis of COVID-19 infection under section 564(b)(1) of the Act, 21 U.S.C. 119JYN-8(G)(9360bbb-3(b)(1), unless the authorization is terminated or revoked sooner. When diagnostic testing is negative, the possibility of a false negative result should be considered in the context of a patient's recent exposures and the presence of clinical signs and symptoms consistent with COVID-19. An individual without symptoms of COVID-19 and who is not shedding SARS-CoV-2 virus would expect to have a negative (not detected) result in this assay.   SARS Coronavirus 2 (CEPHEID - Performed in [facility] lab), Hosp Order     Status: None   Collection Time: 06/23/18 10:55 PM   Specimen: Nasopharyngeal Swab  Result Value Ref Range Status   SARS Coronavirus 2 NEGATIVE NEGATIVE Final    Comment: (NOTE) If result is NEGATIVE SARS-CoV-2 target nucleic acids are NOT DETECTED. The SARS-CoV-2 RNA is generally detectable in upper and lower  respiratory specimens during the acute phase of infection. The lowest  concentration of SARS-CoV-2 viral copies this assay can detect is 250  copies / mL. A  negative result does not preclude SARS-CoV-2 infection  and should not be used as the sole basis for treatment or other  patient management decisions.  A negative result may occur with  improper specimen collection / handling, submission of specimen other  than nasopharyngeal swab, presence of viral mutation(s) within the  areas targeted by this assay, and inadequate number of viral copies  (<250 copies / mL). A negative result must be combined with clinical  observations, patient history, and epidemiological information. If result is POSITIVE SARS-CoV-2 target nucleic acids are DETECTED. The SARS-CoV-2 RNA is generally detectable in upper and lower  respiratory specimens dur ing the acute phase of infection.  Positive  results are indicative of active infection with SARS-CoV-2.  Clinical  correlation with patient history and other diagnostic information is  necessary to determine patient infection status.  Positive results do  not rule out bacterial infection or  co-infection with other viruses. If result is PRESUMPTIVE POSTIVE SARS-CoV-2 nucleic acids MAY BE PRESENT.   A presumptive positive result was obtained on the submitted specimen  and confirmed on repeat testing.  While 2019 novel coronavirus  (SARS-CoV-2) nucleic acids may be present in the submitted sample  additional confirmatory testing may be necessary for epidemiological  and / or clinical management purposes  to differentiate between  SARS-CoV-2 and other Sarbecovirus currently known to infect humans.  If clinically indicated additional testing with an alternate test  methodology [phone]) is advised. The SARS-CoV-2 RNA is generally  detectable in upper and lower respiratory sp ecimens during the acute  phase of infection. The expected result is Negative. Fact Sheet for Patients:  StrictlyIdeas.no Fact Sheet for Healthcare Providers: BankingDealers.co.za This test is not  yet approved or cleared by the [place] FDA and has been authorized for detection and/or diagnosis of SARS-CoV-2 by FDA under an Emergency Use Authorization (EUA).  This EUA will remain in effect (meaning this test can be used) for the duration of the COVID-19 declaration under Section 564(b)(1) of the Act, 21 U.S.C. section 360bbb-3(b)(1), unless the authorization is terminated or revoked sooner. Performed at [facility], [place] [address], [place], [place] [address]       Radiology Studies: No results found.  Scheduled Meds: . feeding supplement  1 Container Oral TID BM  . folic acid  1 mg Intravenous Daily  . [START ON 06/27/2018] pantoprazole  40 mg Intravenous Q12H  . thiamine injection  100 mg Intravenous Daily   Continuous Infusions: . sodium chloride Stopped (06/24/18 0451)  . 0.9 % NaCl with KCl 40 mEq / L 100 mL/hr (06/24/18 0919)  . pantoprozole (PROTONIX) infusion 8 mg/hr (06/24/18 0923)  . potassium chloride       LOS: 0 days   Assessment & Plan:   Active Problems:   Alcohol abuse   Anemia   Hypokalemia   Upper GI bleed   Hematemesis/abdominal pain/upper GI bleed/acute blood loss anemia: Has had one episode of hematemesis since admission.  Hemoglobin dropped a little bit but is still over 7 with no indication of transfusion.  Will monitor every 8 hours and transfuse if drops less than 7.  Currently hemoglobin 8.3.  Protonix twice daily.  Continue IV fluids.  GI on board and plans for EGD today.  COVID -19 negative.  Alcohol abuse: Blood alcohol level less than 10 upon admission.  History of drinking several drinks of alcohol weekly and history of withdrawal in the past however does not seem to be having any signs of withdrawal at this point in time.  Continue to monitor for now.  Will start on CIWA protocol and Ativan once he starts having some initial signs of withdrawal.  Hypokalemia: Again 3.1.  Will replace and recheck in the morning.   Homelessness: Shelter consulted.  DVT prophylaxis: SCD Code Status: Full code Family Communication: Discussed with patient.  No family present. Disposition Plan: To be determined based on clinical work-up/pending procedures.   Time spent: 29 minutes   Darliss Cheney, MD [facility] Pager [phone]  If 7PM-7AM, please contact night-coverage [email or web address] Password [facility] 06/24/2018, 9:24 AM

## 2018-06-24 NOTE — Progress Notes (Signed)
RN sealed patient's blue wallet and phone in front of him to take to security.  RN asked patient if he wanted to count the money and contents in the wallet.  Patient denied wanting to count the money and contents of the wallet.

## 2018-06-24 NOTE — H&P (View-Only) (Signed)
Referring Provider:   [facility]        Primary Care Physician:  Patient, No Pcp Per Primary Gastroenterologist: Unassigned            Reason for Consultation:  GI bleed         ASSESSMENT /  PLAN    1. 58 yo male with hematemesis, ABL anemia in setting of NSAID use.  -Needs EGD to evaluate for PUD / other possible etiologies. The risks and benefits of EGD were discussed and the patient agrees to proceed. Procedure planned for 1 pm today -continue PPI gtt. Will change / downgrade dosing if indicated following EGD.    2. Hypokalemia, K+ improved from 2.8 to 3.0. Repletion is ongoing     Attending Physician Note   I have taken a history, examined the patient and reviewed the chart. I agree with the Advanced Practitioner's note, impression and recommendations.  N/V, dark emesis, melena, ABL anemia in setting of regular NSAID use  R/O ulcer, MW tear, esophagitis, etc.  Hypokalemia  IV PPI infusion for now Trend CBC Replace K+ EGD today   Claudette HeadMalcolm Gisel Vipond, MD Clementeen GrahamFACG     BRIEF HISTORY   Rodney Hartman is a 58 y.o. male residing in a homeless shelter who presented to ED last evening with abdominal pain / vomiting, dark stools and dizziness. Found to be anemic. Hemodynamically stable.   ED EVALUATION   K+ 2.8, BUN 29, Cr 0.82, ALT 45, liver tests o/w normal.  FOBT + Hgb 9.8 >>> down to 8.3 today. Baseline ~ 14. Normal platelets, normal coags   HPI:  Patient developed diffuse mid to upper abdominal pain / vomiting (dark emesis), and black stools a few days ago. Initially the pain was more in the mid abdomen not now more diffuse across upper abdomen with some radiation into the back. He doesn't find any correlation of pain with eating but PO intake has been scant due to the N/V. Patient gives a hx of PUD ~ 20 year ago. He takes daily Naproxen and took a few Goody powders a few days ago. Not on a PPI. He consumes ~ two 40 oz beers at a time but only every few days or so. He  has no chronic GI problems, has never had a screening colonoscopy. No FMH of colon or stomach cancer. Keppra on home med list be he denies hx of seizures and doesn't take the med.    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    TONSILLECTOMY      Prior to Admission medications   Medication Sig Start Date End Date Taking? Authorizing Provider  Aspirin-Acetaminophen-Caffeine (GOODYS EXTRA STRENGTH) [phone] MG PACK Take 2 Packages by mouth daily as needed (headache).   Yes [provider]  lisinopril (PRINIVIL,ZESTRIL) 20 MG tablet Take 1 tablet (20 mg total) by mouth daily. 10/09/16  Yes Nedrud, Jeanella FlatteryMarybeth, MD  levETIRAcetam (KEPPRA) 500 MG tablet Take 1 tablet (500 mg total) by mouth 2 (two) times daily. Patient not taking: Reported on 06/23/2018 10/09/16 10/16/16  Rozann LeschesNedrud, Marybeth, MD  naproxen sodium Margart Sickles(ANAPROX)  550 MG tablet Take 1 tablet (550 mg total) by mouth 2 (two) times daily as needed (back pain). Patient not taking: Reported on 06/23/2018 10/09/16   Rozann LeschesNedrud, Marybeth, MD    Current Facility-Administered Medications  Medication Dose Route Frequency Provider Last Rate Last Dose   0.9 %  sodium chloride infusion   Intravenous Continuous Therisa Doyneoutova, Anastassia, MD   Stopped at 06/24/18 0451   0.9 % NaCl with KCl 40 mEq / L  infusion   Intravenous Continuous Hughie ClossPahwani, Ravi, MD       acetaminophen (TYLENOL) tablet 650 mg  650 mg Oral Q6H PRN Therisa Doyneoutova, Anastassia, MD       Or   acetaminophen (TYLENOL) suppository 650 mg  650 mg Rectal Q6H PRN Doutova, Anastassia, MD       feeding supplement (BOOST / RESOURCE BREEZE) liquid 1 Container  1 Container Oral TID BM  Doutova, Anastassia, MD       folic acid injection 1 mg  1 mg Intravenous Daily Doutova, Anastassia, MD       HYDROcodone-acetaminophen (NORCO/VICODIN) 5-325 MG per tablet 1-2 tablet  1-2 tablet Oral Q4H PRN Doutova, Anastassia, MD       morphine 4 MG/ML injection 4 mg  4 mg Intravenous Once PRN Therisa Doyneoutova, Anastassia, MD       ondansetron (ZOFRAN) tablet 4 mg  4 mg Oral Q6H PRN Doutova, Anastassia, MD       Or   ondansetron (ZOFRAN) injection 4 mg  4 mg Intravenous Q6H PRN Doutova, Anastassia, MD       pantoprazole (PROTONIX) 80 mg in sodium chloride 0.9 % 250 mL (0.32 mg/mL) infusion  8 mg/hr Intravenous Continuous Doutova, Anastassia, MD 25 mL/hr at 06/24/18 0600 8 mg/hr at 06/24/18 0600   [START ON 06/27/2018] pantoprazole (PROTONIX) injection 40 mg  40 mg Intravenous Q12H Doutova, Anastassia, MD       potassium chloride 10 mEq in 100 mL IVPB  10 mEq Intravenous Q1 Hr x 5 Pahwani, Ravi, MD       thiamine (B-1) injection 100 mg  100 mg Intravenous Daily Therisa Doyneoutova, Anastassia, MD        Allergies as of 06/23/2018   (No Known Allergies)    Family History  Problem Relation Age of Onset   Heart disease Mother     Social History   Socioeconomic History   Marital status: Single    Spouse name: Not on file   Number of children: Not on file   Years of education: Not on file   Highest education level: Not on file  Occupational History   Not on file  Social Needs   Financial resource strain: Not on file   Food insecurity    Worry: Not on file    Inability: Not on file   Transportation needs    Medical: Not on file    Non-medical: Not on file  Tobacco Use   Smoking status: Current Every Day Smoker   Smokeless tobacco: Never Used  Substance and Sexual Activity   Alcohol use: Yes    Comment: every other day    Drug use: No   Sexual activity: Yes    Birth control/protection: None  Lifestyle   Physical activity    Days per week: Not on file    Minutes per  session: Not on file   Stress: Not on file  Relationships   Social connections    Talks on phone: Not on file    Gets together: Not on  file    Attends religious service: Not on file    Active member of club or organization: Not on file    Attends meetings of clubs or organizations: Not on file    Relationship status: Not on file   Intimate partner violence    Fear of current or ex partner: Not on file    Emotionally abused: Not on file    Physically abused: Not on file    Forced sexual activity: Not on file  Other Topics Concern   Not on file  Social History Narrative        Drinks two 40oz beers a few times a week    Occasional cigarettes    Review of Systems: Occasional dry cough. No SOB. No fevers. All systems reviewed and negative except where noted in HPI.  Physical Exam: Vital signs in last 24 hours: Temp:  [98.7 F (37.1 C)-99.6 F (37.6 C)] 98.9 F (37.2 C) (06/16 0801) Pulse Rate:  [84-103] 88 (06/16 0801) Resp:  [16-20] 16 (06/16 0801) BP: (125-139)/(77-91) 125/86 (06/16 0801) SpO2:  [100 %] 100 % (06/16 0801) Last BM Date: 06/24/18 General:   Alert, well-developed,  male in NAD Psych:  Pleasant, cooperative. Normal mood and affect. Eyes:  Pupils equal, sclera clear, no icterus.   Conjunctiva pink. Ears:  Normal auditory acuity. Nose:  No deformity, discharge,  or lesions. Neck:  Supple; no masses Lungs:  A few fine crackles in LLL, o/w clear to ausculation.   Heart:  Regular rate and rhythm; no murmurs, no lower extremity edema Abdomen:  Soft, non-distended, nontender, BS active, no palp mass    Rectal:  Deferred  Msk:  Symmetrical without gross deformities. . Neurologic:  Alert and  oriented x4;  grossly normal neurologically. Skin:  Intact without significant lesions or rashes.   Intake/Output from previous day: 06/15 0701 - 06/16 0700 In: 2623.8 [I.V.:1215.5; IV Piggyback:1408.3] Out: -  Intake/Output this shift: No intake/output data  recorded.  Lab Results: Recent Labs    06/23/18 2129 06/24/18 0521  WBC 7.4 4.6  HGB 9.8* 8.3*  HCT 30.0* 26.1*  PLT 180 162   BMET Recent Labs    06/23/18 2129 06/24/18 0521  NA 138 139  K 2.8* 3.0*  CL 100 107  CO2 27 23  GLUCOSE 93 95  BUN 29* 23*  CREATININE 0.82 0.74  CALCIUM 9.9 8.8*   LFT Recent Labs    06/24/18 0521  PROT 5.9*  ALBUMIN 3.3*  AST 30  ALT 36  ALKPHOS 44  BILITOT 0.9   PT/INR Recent Labs    06/23/18 2129  LABPROT 13.2  INR 1.0   Hepatitis Panel No results for input(s): HEPBSAG, HCVAB, HEPAIGM, HEPBIGM in the last 72 hours.   . CBC Latest Ref Rng & Units 06/24/2018 06/23/2018 11/12/2016  WBC 4.0 - 10.5 K/uL 4.6 7.4 3.8(L)  Hemoglobin 13.0 - 17.0 g/dL 8.3(L) 9.8(L) 15.6  Hematocrit 39.0 - 52.0 % 26.1(L) 30.0(L) 46.4  Platelets 150 - 400 K/uL 162 180 265    . CMP Latest Ref Rng & Units 06/24/2018 06/23/2018 11/12/2016  Glucose 70 - 99 mg/dL 95 93 93  BUN 6 - 20 mg/dL 16(X23(H) 09(U29(H) <0(A<5(L)  Creatinine 0.61 - 1.24 mg/dL 5.400.74 9.810.82 1.910.80  Sodium 135 - 145 mmol/L 139 138 139  Potassium 3.5 - 5.1 mmol/L 3.0(L) 2.8(L) 3.8  Chloride 98 - 111 mmol/L 107 100 101  CO2 22 - 32 mmol/L 23 27 29   Calcium 8.9 - 10.3  mg/dL 8.8(L) 9.9 9.3  Total Protein 6.5 - 8.1 g/dL 5.9(L) 7.7 -  Total Bilirubin 0.3 - 1.2 mg/dL 0.9 1.2 -  Alkaline Phos 38 - 126 U/L 44 58 -  AST 15 - 41 U/L 30 36 -  ALT 0 - 44 U/L 36 45(H) -   Studies/Results: No results found.   Rodney Savoy, NP-C @  06/24/2018, 9:04 AM

## 2018-06-24 NOTE — Anesthesia Preprocedure Evaluation (Addendum)
Anesthesia Evaluation  Patient identified by MRN, date of birth, ID band Patient awake    Reviewed: Allergy & Precautions, H&P , NPO status , Patient's Chart, lab work & pertinent test results, reviewed documented beta blocker date and time   Airway Mallampati: I  TM Distance: >3 FB Neck ROM: full    Dental no notable dental hx. (+) Dental Advisory Given, Loose, Missing, Chipped, Poor Dentition,    Pulmonary neg pulmonary ROS, Current Smoker,    Pulmonary exam normal breath sounds clear to auscultation       Cardiovascular Exercise Tolerance: Good hypertension, negative cardio ROS   Rhythm:regular Rate:Normal     Neuro/Psych PSYCHIATRIC DISORDERS Depression CVA    GI/Hepatic PUD, (+)     substance abuse  alcohol use, History of delirium tremens psychosis   Endo/Other  negative endocrine ROS  Renal/GU negative Renal ROS  negative genitourinary   Musculoskeletal   Abdominal   Peds  Hematology  (+) Blood dyscrasia, anemia ,   Anesthesia Other Findings   Reproductive/Obstetrics negative OB ROS                           Anesthesia Physical Anesthesia Plan  ASA: III  Anesthesia Plan: MAC   Post-op Pain Management:    Induction: Intravenous  PONV Risk Score and Plan: 2  Airway Management Planned: Mask, Natural Airway and Nasal Cannula  Additional Equipment:   Intra-op Plan:   Post-operative Plan:   Informed Consent: I have reviewed the patients History and Physical, chart, labs and discussed the procedure including the risks, benefits and alternatives for the proposed anesthesia with the patient or authorized representative who has indicated his/her understanding and acceptance.     Dental Advisory Given  Plan Discussed with: CRNA, Anesthesiologist and Surgeon  Anesthesia Plan Comments:        Anesthesia Quick Evaluation

## 2018-06-24 NOTE — ED Notes (Signed)
Patient ambulated to restroom with minimal assistance. ?

## 2018-06-24 NOTE — Anesthesia Procedure Notes (Signed)
Procedure Name: MAC Date/Time: 06/24/2018 1:22 PM Performed by: West Pugh, CRNA Pre-anesthesia Checklist: Patient identified, Emergency Drugs available, Suction available, Patient being monitored and Timeout performed Patient Re-evaluated:Patient Re-evaluated prior to induction Oxygen Delivery Method: Simple face mask Preoxygenation: Pre-oxygenation with 100% oxygen Induction Type: IV induction Placement Confirmation: positive ETCO2 Dental Injury: Teeth and Oropharynx as per pre-operative assessment

## 2018-06-24 NOTE — ED Notes (Signed)
Patient made aware that urine sample is needed.  

## 2018-06-24 NOTE — Anesthesia Postprocedure Evaluation (Signed)
Anesthesia Post Note  Patient: Rodney Hartman  Procedure(s) Performed: ESOPHAGOGASTRODUODENOSCOPY (EGD) (N/A ) BIOPSY     Patient location during evaluation: PACU Anesthesia Type: MAC Level of consciousness: awake and alert Pain management: pain level controlled Vital Signs Assessment: post-procedure vital signs reviewed and stable Respiratory status: spontaneous breathing, nonlabored ventilation, respiratory function stable and patient connected to nasal cannula oxygen Cardiovascular status: stable and blood pressure returned to baseline Postop Assessment: no apparent nausea or vomiting Anesthetic complications: no    Last Vitals:  Vitals:   06/24/18 1245 06/24/18 1342  BP: 124/86 114/74  Pulse: 76 85  Resp: 17 19  Temp: 37 C 36.7 C  SpO2: 97% 93%    Last Pain:  Vitals:   06/24/18 1342  TempSrc: Other (Comment)  PainSc: 0-No pain                 [name]

## 2018-06-24 NOTE — Consult Note (Addendum)
° °Referring Provider:   [facility]        °Primary Care Physician:  Patient, No Pcp Per °Primary Gastroenterologist: Unassigned            °Reason for Consultation:  GI bleed        ° °ASSESSMENT /  PLAN   ° °1. 58 yo male with hematemesis, ABL anemia in setting of NSAID use.  °-Needs EGD to evaluate for PUD / other possible etiologies. The risks and benefits of EGD were discussed and the patient agrees to proceed. Procedure planned for 1 pm today °-continue PPI gtt. Will change / downgrade dosing if indicated following EGD.  °  °2. Hypokalemia, K+ improved from 2.8 to 3.0. Repletion is ongoing ° ° ° ° Attending Physician Note  ° °I have taken a history, examined the patient and reviewed the chart. I agree with the Advanced Practitioner's note, impression and recommendations. ° °N/V, dark emesis, melena, ABL anemia in setting of regular NSAID use  °R/O ulcer, MW tear, esophagitis, etc.  °Hypokalemia ° °IV PPI infusion for now °Trend CBC °Replace K+ °EGD today ° ° °Frederico Gerling, MD FACG ° ° ° ° °BRIEF HISTORY  ° °Rodney Hartman is a 58 y.o. male residing in a homeless shelter who presented to ED last evening with abdominal pain / vomiting, dark stools and dizziness. Found to be anemic. Hemodynamically stable.  ° °ED EVALUATION  ° °K+ 2.8, BUN 29, Cr 0.82, ALT 45, liver tests o/w normal.  °FOBT + °Hgb 9.8 >>> down to 8.3 today. Baseline ~ 14. Normal platelets, normal coags ° ° °HPI:  °Patient developed diffuse mid to upper abdominal pain / vomiting (dark emesis), and black stools a few days ago. Initially the pain was more in the mid abdomen not now more diffuse across upper abdomen with some radiation into the back. He doesn't find any correlation of pain with eating but PO intake has been scant due to the N/V. Patient gives a hx of PUD ~ 20 year ago. He takes daily Naproxen and took a few Goody powders a few days ago. Not on a PPI. He consumes ~ two 40 oz beers at a time but only every few days or so. He  has no chronic GI problems, has never had a screening colonoscopy. No FMH of colon or stomach cancer. Keppra on home med list be he denies hx of seizures and doesn't take the med.  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   °• TONSILLECTOMY    ° ° °Prior to Admission medications   °Medication Sig Start Date End Date Taking? Authorizing Provider  °Aspirin-Acetaminophen-Caffeine (GOODYS EXTRA STRENGTH) 500-325-65 MG PACK Take 2 Packages by mouth daily as needed (headache).   Yes [provider]  °lisinopril (PRINIVIL,ZESTRIL) 20 MG tablet Take 1 tablet (20 mg total) by mouth daily. 10/09/16  Yes Nedrud, Marybeth, MD  °levETIRAcetam (KEPPRA) 500 MG tablet Take 1 tablet (500 mg total) by mouth 2 (two) times daily. °Patient not taking: Reported on 06/23/2018 10/09/16 10/16/16  Nedrud, Marybeth, MD  °naproxen sodium (ANAPROX)   550 MG tablet Take 1 tablet (550 mg total) by mouth 2 (two) times daily as needed (back pain). Patient not taking: Reported on 06/23/2018 10/09/16   Rozann LeschesNedrud, Marybeth, MD    Current Facility-Administered Medications  Medication Dose Route Frequency Provider Last Rate Last Dose   0.9 %  sodium chloride infusion   Intravenous Continuous Therisa Doyneoutova, Anastassia, MD   Stopped at 06/24/18 0451   0.9 % NaCl with KCl 40 mEq / L  infusion   Intravenous Continuous Hughie ClossPahwani, Ravi, MD       acetaminophen (TYLENOL) tablet 650 mg  650 mg Oral Q6H PRN Therisa Doyneoutova, Anastassia, MD       Or   acetaminophen (TYLENOL) suppository 650 mg  650 mg Rectal Q6H PRN Doutova, Anastassia, MD       feeding supplement (BOOST / RESOURCE BREEZE) liquid 1 Container  1 Container Oral TID BM  Doutova, Anastassia, MD       folic acid injection 1 mg  1 mg Intravenous Daily Doutova, Anastassia, MD       HYDROcodone-acetaminophen (NORCO/VICODIN) 5-325 MG per tablet 1-2 tablet  1-2 tablet Oral Q4H PRN Doutova, Anastassia, MD       morphine 4 MG/ML injection 4 mg  4 mg Intravenous Once PRN Therisa Doyneoutova, Anastassia, MD       ondansetron (ZOFRAN) tablet 4 mg  4 mg Oral Q6H PRN Doutova, Anastassia, MD       Or   ondansetron (ZOFRAN) injection 4 mg  4 mg Intravenous Q6H PRN Doutova, Anastassia, MD       pantoprazole (PROTONIX) 80 mg in sodium chloride 0.9 % 250 mL (0.32 mg/mL) infusion  8 mg/hr Intravenous Continuous Doutova, Anastassia, MD 25 mL/hr at 06/24/18 0600 8 mg/hr at 06/24/18 0600   [START ON 06/27/2018] pantoprazole (PROTONIX) injection 40 mg  40 mg Intravenous Q12H Doutova, Anastassia, MD       potassium chloride 10 mEq in 100 mL IVPB  10 mEq Intravenous Q1 Hr x 5 Pahwani, Ravi, MD       thiamine (B-1) injection 100 mg  100 mg Intravenous Daily Therisa Doyneoutova, Anastassia, MD        Allergies as of 06/23/2018   (No Known Allergies)    Family History  Problem Relation Age of Onset   Heart disease Mother     Social History   Socioeconomic History   Marital status: Single    Spouse name: Not on file   Number of children: Not on file   Years of education: Not on file   Highest education level: Not on file  Occupational History   Not on file  Social Needs   Financial resource strain: Not on file   Food insecurity    Worry: Not on file    Inability: Not on file   Transportation needs    Medical: Not on file    Non-medical: Not on file  Tobacco Use   Smoking status: Current Every Day Smoker   Smokeless tobacco: Never Used  Substance and Sexual Activity   Alcohol use: Yes    Comment: every other day    Drug use: No   Sexual activity: Yes    Birth control/protection: None  Lifestyle   Physical activity    Days per week: Not on file    Minutes per  session: Not on file   Stress: Not on file  Relationships   Social connections    Talks on phone: Not on file    Gets together: Not on  file    Attends religious service: Not on file    Active member of club or organization: Not on file    Attends meetings of clubs or organizations: Not on file    Relationship status: Not on file   Intimate partner violence    Fear of current or ex partner: Not on file    Emotionally abused: Not on file    Physically abused: Not on file    Forced sexual activity: Not on file  Other Topics Concern   Not on file  Social History Narrative        Drinks two 40oz beers a few times a week    Occasional cigarettes    Review of Systems: Occasional dry cough. No SOB. No fevers. All systems reviewed and negative except where noted in HPI.  Physical Exam: Vital signs in last 24 hours: Temp:  [98.7 F (37.1 C)-99.6 F (37.6 C)] 98.9 F (37.2 C) (06/16 0801) Pulse Rate:  [84-103] 88 (06/16 0801) Resp:  [16-20] 16 (06/16 0801) BP: (125-139)/(77-91) 125/86 (06/16 0801) SpO2:  [100 %] 100 % (06/16 0801) Last BM Date: 06/24/18 General:   Alert, well-developed,  male in NAD Psych:  Pleasant, cooperative. Normal mood and affect. Eyes:  Pupils equal, sclera clear, no icterus.   Conjunctiva pink. Ears:  Normal auditory acuity. Nose:  No deformity, discharge,  or lesions. Neck:  Supple; no masses Lungs:  A few fine crackles in LLL, o/w clear to ausculation.   Heart:  Regular rate and rhythm; no murmurs, no lower extremity edema Abdomen:  Soft, non-distended, nontender, BS active, no palp mass    Rectal:  Deferred  Msk:  Symmetrical without gross deformities. . Neurologic:  Alert and  oriented x4;  grossly normal neurologically. Skin:  Intact without significant lesions or rashes.   Intake/Output from previous day: 06/15 0701 - 06/16 0700 In: 2623.8 [I.V.:1215.5; IV Piggyback:1408.3] Out: -  Intake/Output this shift: No intake/output data  recorded.  Lab Results: Recent Labs    06/23/18 2129 06/24/18 0521  WBC 7.4 4.6  HGB 9.8* 8.3*  HCT 30.0* 26.1*  PLT 180 162   BMET Recent Labs    06/23/18 2129 06/24/18 0521  NA 138 139  K 2.8* 3.0*  CL 100 107  CO2 27 23  GLUCOSE 93 95  BUN 29* 23*  CREATININE 0.82 0.74  CALCIUM 9.9 8.8*   LFT Recent Labs    06/24/18 0521  PROT 5.9*  ALBUMIN 3.3*  AST 30  ALT 36  ALKPHOS 44  BILITOT 0.9   PT/INR Recent Labs    06/23/18 2129  LABPROT 13.2  INR 1.0   Hepatitis Panel No results for input(s): HEPBSAG, HCVAB, HEPAIGM, HEPBIGM in the last 72 hours.   . CBC Latest Ref Rng & Units 06/24/2018 06/23/2018 11/12/2016  WBC 4.0 - 10.5 K/uL 4.6 7.4 3.8(L)  Hemoglobin 13.0 - 17.0 g/dL 8.3(L) 9.8(L) 15.6  Hematocrit 39.0 - 52.0 % 26.1(L) 30.0(L) 46.4  Platelets 150 - 400 K/uL 162 180 265    . CMP Latest Ref Rng & Units 06/24/2018 06/23/2018 11/12/2016  Glucose 70 - 99 mg/dL 95 93 93  BUN 6 - 20 mg/dL 16(X23(H) 09(U29(H) <0(A<5(L)  Creatinine 0.61 - 1.24 mg/dL 5.400.74 9.810.82 1.910.80  Sodium 135 - 145 mmol/L 139 138 139  Potassium 3.5 - 5.1 mmol/L 3.0(L) 2.8(L) 3.8  Chloride 98 - 111 mmol/L 107 100 101  CO2 22 - 32 mmol/L 23 27 29   Calcium 8.9 - 10.3  mg/dL 8.8(L) 9.9 9.3  Total Protein 6.5 - 8.1 g/dL 5.9(L) 7.7 -  Total Bilirubin 0.3 - 1.2 mg/dL 0.9 1.2 -  Alkaline Phos 38 - 126 U/L 44 58 -  AST 15 - 41 U/L 30 36 -  ALT 0 - 44 U/L 36 45(H) -   Studies/Results: No results found.   Tye Savoy, NP-C @  06/24/2018, 9:04 AM

## 2018-06-25 DIAGNOSIS — K250 Acute gastric ulcer with hemorrhage: Principal | ICD-10-CM

## 2018-06-25 LAB — CBC
HCT: 23.4 % — ABNORMAL LOW (ref 39.0–52.0)
Hemoglobin: 7.7 g/dL — ABNORMAL LOW (ref 13.0–17.0)
MCH: 32.4 pg (ref 26.0–34.0)
MCHC: 32.9 g/dL (ref 30.0–36.0)
MCV: 98.3 fL (ref 80.0–100.0)
Platelets: 165 10*3/uL (ref 150–400)
RBC: 2.38 MIL/uL — ABNORMAL LOW (ref 4.22–5.81)
RDW: 15.1 % (ref 11.5–15.5)
WBC: 4 10*3/uL (ref 4.0–10.5)
nRBC: 0 % (ref 0.0–0.2)

## 2018-06-25 LAB — BASIC METABOLIC PANEL
Anion gap: 5 (ref 5–15)
BUN: 11 mg/dL (ref 6–20)
CO2: 23 mmol/L (ref 22–32)
Calcium: 8.4 mg/dL — ABNORMAL LOW (ref 8.9–10.3)
Chloride: 111 mmol/L (ref 98–111)
Creatinine, Ser: 0.8 mg/dL (ref 0.61–1.24)
GFR calc Af Amer: >60 mL/min (ref >60)
GFR calc non Af Amer: >60 mL/min (ref >60)
Glucose, Bld: 98 mg/dL (ref 70–99)
Potassium: 4.2 mmol/L (ref 3.5–5.1)
Sodium: 139 mmol/L (ref 135–145)

## 2018-06-25 MED ORDER — VITAMIN B-1 100 MG PO TABS
100.0000 mg | ORAL_TABLET | Freq: Every day | ORAL | Status: DC
  Administered 2018-06-26: 100 mg via ORAL
  Filled 2018-06-25: qty 1

## 2018-06-25 MED ORDER — FOLIC ACID 1 MG PO TABS
1.0000 mg | ORAL_TABLET | Freq: Every day | ORAL | Status: DC
  Administered 2018-06-25 – 2018-06-26 (×2): 1 mg via ORAL
  Filled 2018-06-25 (×2): qty 1

## 2018-06-25 MED ORDER — PANTOPRAZOLE SODIUM 40 MG PO TBEC
40.0000 mg | DELAYED_RELEASE_TABLET | Freq: Two times a day (BID) | ORAL | Status: DC
  Administered 2018-06-25 – 2018-06-26 (×2): 40 mg via ORAL
  Filled 2018-06-25 (×2): qty 1

## 2018-06-25 MED ORDER — ADULT MULTIVITAMIN W/MINERALS CH
1.0000 | ORAL_TABLET | Freq: Every day | ORAL | Status: DC
  Administered 2018-06-25 – 2018-06-26 (×2): 1 via ORAL
  Filled 2018-06-25 (×2): qty 1

## 2018-06-25 NOTE — TOC Initial Note (Signed)
Transition of Care [facility]) - Initial/Assessment Note    Patient Details  Name: Rodney Hartman MRN: [id number] Date of Birth: [birth year]  [facility]) CM/SW Contact:    Lia Hopping, [place] Phone Number: 06/25/2018, 3:52 PM  Clinical Narrative:                 CSW met with the patient at bedside. Patient accepting and agreeable to discuss current social issues. Patient reports he has been homeless for the past four months and currently resides at the [facility]. Patient reports he has been actively seeking employment but the pandemic has been a barrier. Patient reports he is familiar with the [facility] and utilizes their onsite medical facility. He gets his blood pressure medication prescribed there and [facility]. Patient reports he does not have a PCP and is agreeable for CSW schedule a follow up appointment.   Re: Alcohol Resources: Patient reports he drinks 2-3 forty ounce beers a day. Patient sought substance use treatment at [facility] about four years ago. Patient reports he attended alcohol anonymous groups but felt they were not very helpful therefore stopped going.Patient express readiness to change and is open to seeking substance use treatment.  CSW inquired about scheduling the appointment, he requests to call and arrange the appointment. CSW provided contact information for [facility].    CSW informed the patient about the [facility] program. The patient consented to learning more about housing. Patient reports his cell phone has been turned off but it should be activated soon for community representatives to follow up with him.   Expected Discharge Plan: Homeless Shelter Barriers to Discharge: No Barriers Identified   Patient Goals and CMS Choice        Expected Discharge Plan and Services Expected Discharge Plan: Homeless Shelter In-house  Referral: Clinical Social Work     Living arrangements for the past 2 months: Homeless Shelter                                     Prior Living Arrangements/Services Living arrangements for the past 2 months: Homeless Shelter Lives with:: Facility Resident Patient language and need for interpreter reviewed:: No Do you feel safe going back to the place where you live?: Yes      Need for Family Participation in Patient Care: No (Comment) Care giver support system in place?: No (comment)   Criminal Activity/Legal Involvement Pertinent to Current Situation/Hospitalization: No - Comment as needed  Activities of Daily Living Home Assistive Devices/Equipment: None ADL Screening (condition at time of admission) Patient's cognitive ability adequate to safely complete daily activities?: Yes Is the patient deaf or have difficulty hearing?: No Does the patient have difficulty seeing, even when wearing glasses/contacts?: No Does the patient have difficulty concentrating, remembering, or making decisions?: Yes Patient able to express need for assistance with ADLs?: Yes Does the patient have difficulty dressing or bathing?: No Independently performs ADLs?: Yes (appropriate for developmental age) Does the patient have difficulty walking or climbing stairs?: No Weakness of Legs: None Weakness of Arms/Hands: None  Permission Sought/Granted Permission sought to share information with : Case Manager, Customer service manager Permission granted to share information with : Yes, Verbal Permission Granted     Permission granted to share info w AGENCY: [employer]  Emotional Assessment Appearance:: Appears stated age   Affect (typically observed): Accepting, Hopeful Orientation: : Oriented to Self, Oriented to Place, Oriented to  Time, Oriented to Situation Alcohol / Substance Use: Not Applicable Psych Involvement: No (comment)  Admission diagnosis:  Hypokalemia  [E87.6] UGI bleed [K92.2] Upper GI bleed [K92.2] Patient Active Problem List   Diagnosis Date Noted  . Melena   . Non-intractable vomiting   . Acute gastric ulcer with hemorrhage   . Hypokalemia 06/23/2018  . Upper GI bleed 06/23/2018  . Suicidal ideation   . Substance induced mood disorder ([place])   . Fall 10/07/2016  . Subdural hematoma ([place]) 10/07/2016  . Atypical chest pain   . Acute congestive heart failure ([place]) 08/12/2015  . Anemia 08/12/2015  . HTN (hypertension) 08/12/2015  . Alcohol abuse with intoxication, uncomplicated ([place]) 90/93/1121  . MDD (major depressive disorder), recurrent severe, without psychosis ([place]) 07/31/2015  . Suicidal ideation 07/31/2015  . Homicidal ideation 07/31/2015  . Alcohol abuse with alcohol-induced mood disorder ([place]) 01/25/2015  . Alcohol use disorder, severe, dependence ([place]) 12/17/2014  . Alcohol abuse 08/07/2014   PCP:  Patient, No Pcp Per Pharmacy:   [facility], [address] [place] [address] Phone: [phone] Fax: [phone]     Social Determinants of Health (SDOH) Interventions    Readmission Risk Interventions No flowsheet data found.

## 2018-06-25 NOTE — Progress Notes (Signed)
Initial Nutrition Assessment  INTERVENTION:   -Continue Boost Breeze po TID, each supplement provides 250 kcal and 9 grams of protein -Provide Multivitamin with minerals daily  NUTRITION DIAGNOSIS:   Increased nutrient needs related to (ETOH abuse) as evidenced by estimated needs.  GOAL:   Patient will meet greater than or equal to 90% of their needs  MONITOR:   PO intake, Labs, Weight trends, Supplement acceptance, I & O's  REASON FOR ASSESSMENT:   Malnutrition Screening Tool    ASSESSMENT:   58 y.o. male with medical history significant of homelessness, alcohol abuse subdural hematoma status post head injury, delirium tremens psychosis, peptic ulcer disease, anemia, and hypertension presented to ER on 06/23/2018 with complaint of 2-day history of nausea and vomiting of dark-colored vomitus as well as dark-colored stools and abdominal pain. 6/16: EGD: revealed gastric ulcer, esophagitis  **RD working remotely**  Patient was NPO for EGD yesterday. Pt now on a soft diet and consuming 100% of meals. Pt is drinking Boost Breeze supplements. Will add daily MVI as well given reports ETOH abuse PTA.   Per weight records, last weight was recorded on 10/07/16 (201 lbs). Unable to verify any weight changes.  Labs reviewed. Medications: Folic acid tablet daily, Thiamine tablet daily  NUTRITION - FOCUSED PHYSICAL EXAM:  Unable to perform per department requirement to work remotely.  Diet Order:   Diet Order            DIET SOFT Room service appropriate? Yes; Fluid consistency: Thin  Diet effective now              EDUCATION NEEDS:   No education needs have been identified at this time  Skin:  Skin Assessment: Reviewed RN Assessment  Last BM:  6/16  Height:   Ht Readings from Last 1 Encounters:  06/24/18 5\' 11"  (1.803 m)    Weight:   Wt Readings from Last 1 Encounters:  06/24/18 95.3 kg    Ideal Body Weight:  78.2 kg  BMI:  Body mass index is 29.29  kg/m.  Estimated Nutritional Needs:   Kcal:  7408-1448  Protein:  110-120g  Fluid:  2L/day  Clayton Bibles, MS, RD, LDN [facility] Dietitian Pager: [phone] After Hours Pager: [phone]

## 2018-06-25 NOTE — Progress Notes (Addendum)
Progress Note    ASSESSMENT AND PLAN:   1. Hematemesis / melena. Non-bleeding esophagitis / one cratered gastric ulcer without active bleeding or stigmataof recent bleeding, non-bleeding gastric erosion, gastric erythema. Normal duodenum. -Bleeding resolved     -await gastric biopsies. Our office will notify him of results -BID PPI x 1 month then daily (long term). Avoid NSAIDS.  -Office follow made.   2. ABL anemia. Hgb stable at 7.7 without transfusion. Hgb normal at baseline.  -Recommend oral iron replacement upon discharge vrs dose of IV iron.  -Will repeat CBC at office follow up  3. Colon cancer screening. Needs outpatient colonoscopy.  Will discuss at hospital follow up visit.     Attending Physician Note   I have taken an interval history, reviewed the chart and examined the patient. I agree with the Advanced Practitioner's note, impression and recommendations.   Gastric ulcer, clean based, without recurrent bleeding  ABL anemia, Hb stable at 7.7  GERD with LA Class B esophagitis CRC screening as outpatient  Avoid ASA/NSAIDs long term Advance diet as tolerated Follow antireflux measures long term  PPI bid for 1 month then PPI qam long term Iron per primary service Await gastric biopsies  OK for discharge from GI standpoint with GI follow up as outlined As outpatient recommend colonoscopy for screening and consider repeat EGD to assess ulcer healing Post hospital PCP follow up   Claudette HeadMalcolm Gray Maugeri, MD [facility]    SUBJECTIVE   No further hematemesis / melena    OBJECTIVE:     Vital signs in last 24 hours: Temp:  [98 F (36.7 C)-98.8 F (37.1 C)] 98.5 F (36.9 C) (06/17 0541) Pulse Rate:  [76-91] 88 (06/17 0541) Resp:  [14-19] 16 (06/17 0541) BP: (106-125)/(74-95) 120/80 (06/17 0541) SpO2:  [93 %-100 %] 99 % (06/17 0541) Weight:  [95.3 kg] 95.3 kg (06/16 1245) Last BM Date: 06/24/18 General:   Alert, well-developed male in NAD EENT:  Normal hearing,  non icteric sclera, conjunctive pink.  Heart:  Regular rate and rhythm.  No lower extremity edema   Pulm: Normal respiratory effort Abdomen:  Soft, nondistended, nontender.  Normal bowel sounds.  Extremities:  Mild LUE swelling just proximal to IV site     Neurologic:  Alert and  oriented x4;  grossly normal neurologically. Psych:  Pleasant, cooperative.  Normal mood and affect.   Intake/Output from previous day: 06/16 0701 - 06/17 0700 In: 1701 [P.O.:720; I.V.:781; IV Piggyback:200] Out: -  Intake/Output this shift: Total I/O In: 240 [P.O.:240] Out: -   Lab Results: Recent Labs    06/24/18 1610 06/24/18 2031 06/25/18 0529  WBC 4.0 4.0 4.0  HGB 8.9* 7.6* 7.7*  HCT 27.7* 23.8* 23.4*  PLT 166 158 165   BMET Recent Labs    06/23/18 2129 06/24/18 0521 06/25/18 0529  NA 138 139 139  K 2.8* 3.0* 4.2  CL 100 107 111  CO2 27 23 23   GLUCOSE 93 95 98  BUN 29* 23* 11  CREATININE 0.82 0.74 0.80  CALCIUM 9.9 8.8* 8.4*   LFT Recent Labs    06/24/18 0521  PROT 5.9*  ALBUMIN 3.3*  AST 30  ALT 36  ALKPHOS 44  BILITOT 0.9   PT/INR Recent Labs    06/23/18 2129  LABPROT 13.2  INR 1.0     Discharge Planning  Diet:no restrictions  Discharge Medications: Protonix 40 mg BID. If cheaper can substitute with Omeprazole 40 mg BID .  Follow up:  07/03/18 at 9:30am   Active Problems:   Alcohol abuse   Anemia   Hypokalemia   Upper GI bleed   Melena   Non-intractable vomiting   Acute gastric ulcer with hemorrhage     LOS: 1 day   Tye Savoy ,NP 06/25/2018, 9:10 AM

## 2018-06-25 NOTE — Progress Notes (Addendum)
PROGRESS NOTE    Rodney Hartman  ATF:[id number] DOB: Apr 06, 1960 DOA: 06/23/2018 PCP: Patient, No Pcp Per     Brief Narrative:  Rodney Hartman a 58 y.o.malewith medical history significant of homelessness, alcohol abuse subdural hematoma status post head injury, delirium tremens psychosis, peptic ulcer disease, anemia, and hypertension presented to ER on 06/23/2018 with complaint of 2-day history of nausea and vomiting of dark-colored vomitus as well as dark-colored stools and abdominal pain.  Patient has been using Goody powder to help his stomach pains.  He also drinks few alcoholic drinks per week and he claimed that his last drink was Thursday.  He also has a remote history of peptic ulcer disease about 20 years ago at some outside facility.  He was on to be anemic with hemoglobin over 8 with possible diagnosis of upper GI bleed and admitted under hospitalist service and GI has been consulted.    Patient underwent EGD on 6/16 which found nonbleeding gastric ulcer with no stigmata of bleeding, nonbleeding erosive gastropathy, erythematous mucosa in the gastric body and antrum.  New events last 24 hours / Subjective: Continues to complain of some abdominal pain, lower abdomen.  No vomiting today but had some nausea.  Not feeling back to his baseline.  Assessment & Plan:   Active Problems:   Alcohol abuse   Anemia   Hypokalemia   Upper GI bleed   Melena   Non-intractable vomiting   Acute gastric ulcer with hemorrhage   Hematemesis and acute blood loss anemia -Has had one episode of hematemesis since admission -Status post EGD 6/16 which revealed nonbleeding esophagitis with nonbleeding gastric ulcer, nonbleeding gastric erosion, gastric erythema.  Biopsies pending, GI will notify him of results -Protonix twice daily for 1 month then once daily  -Avoid NSAIDs  Alcohol abuse -Blood alcohol level less than 10 upon admission.  History of drinking several drinks of alcohol weekly and  history of withdrawal in the past however does not seem to be having any signs of withdrawal at this point in time.   -Folic acid and thiamine ordered  Homelessness -SW consulted   DVT prophylaxis: SCD Code Status: Full code Family Communication: None Disposition Plan: Hopeful discharge 6/18 once his abdominal pain and oral intake has improved   Consultants:   GI  Procedures:   EGD 06/24/2018 Impression:       - LA Grade B reflux esophagitis.                           - Non-bleeding gastric ulcer with no stigmata of                            bleeding.                           - Non-bleeding erosive gastropathy. Biopsied.                           - Erythematous mucosa in the gastric body and                            antrum. Biopsied.                           - Normal duodenal bulb  and second portion of the                            duodenum.  Antimicrobials:  Anti-infectives (From admission, onward)   None        Objective: Vitals:   06/24/18 1629 06/24/18 1944 06/25/18 0002 06/25/18 0541  BP: 111/77 106/77 125/81 120/80  Pulse: 88 80 91 88  Resp: 17 16 14 16   Temp: 98.7 F (37.1 C) 98.1 F (36.7 C) 98.8 F (37.1 C) 98.5 F (36.9 C)  TempSrc: Oral Oral Oral Oral  SpO2: 98% 100% 100% 99%  Weight:      Height:        Intake/Output Summary (Last 24 hours) at 06/25/2018 1124 Last data filed at 06/25/2018 0800 Gross per 24 hour  Intake 1941.01 ml  Output -  Net 1941.01 ml   Filed Weights   06/24/18 1245  Weight: 95.3 kg    Examination:  General exam: Appears calm and comfortable  Respiratory system: Clear to auscultation. Respiratory effort normal. Cardiovascular system: S1 & S2 heard, RRR. No JVD, murmurs, rubs, gallops or clicks. No pedal edema. Gastrointestinal system: Abdomen is nondistended, soft and TTP bilateral lower quadrants. No organomegaly or masses felt. Normal bowel sounds heard. Central nervous system: Alert and oriented. No focal  neurological deficits. Extremities: Symmetric 5 x 5 power. Skin: No rashes, lesions or ulcers Psychiatry: Judgement and insight appear normal. Mood & affect appropriate.   Data Reviewed: I have personally reviewed following labs and imaging studies  CBC: Recent Labs  Lab 06/23/18 2129 06/24/18 0521 06/24/18 0945 06/24/18 1610 06/24/18 2031 06/25/18 0529  WBC 7.4 4.6 4.1 4.0 4.0 4.0  NEUTROABS 6.0  --   --   --   --   --   HGB 9.8* 8.3* 7.7* 8.9* 7.6* 7.7*  HCT 30.0* 26.1* 23.9* 27.7* 23.8* 23.4*  MCV 94.6 95.6 95.6 99.3 100.4* 98.3  PLT 180 162 158 166 158 165   Basic Metabolic Panel: Recent Labs  Lab 06/23/18 2129 06/23/18 2334 06/24/18 0521 06/25/18 0529  NA 138  --  139 139  K 2.8*  --  3.0* 4.2  CL 100  --  107 111  CO2 27  --  23 23  GLUCOSE 93  --  95 98  BUN 29*  --  23* 11  CREATININE 0.82  --  0.74 0.80  CALCIUM 9.9  --  8.8* 8.4*  MG  --  1.9 1.8  --   PHOS  --  3.2 3.3  --    GFR: Estimated Creatinine Clearance: 121.5 mL/min (by C-G formula based on SCr of 0.8 mg/dL). Liver Function Tests: Recent Labs  Lab 06/23/18 2129 06/24/18 0521  AST 36 30  ALT 45* 36  ALKPHOS 58 44  BILITOT 1.2 0.9  PROT 7.7 5.9*  ALBUMIN 4.3 3.3*   No results for input(s): LIPASE, AMYLASE in the last 168 hours. No results for input(s): AMMONIA in the last 168 hours. Coagulation Profile: Recent Labs  Lab 06/23/18 2129  INR 1.0   Cardiac Enzymes: No results for input(s): CKTOTAL, CKMB, CKMBINDEX, TROPONINI in the last 168 hours. BNP (last 3 results) No results for input(s): PROBNP in the last 8760 hours. HbA1C: No results for input(s): HGBA1C in the last 72 hours. CBG: No results for input(s): GLUCAP in the last 168 hours. Lipid Profile: No results for input(s): CHOL, HDL, LDLCALC, TRIG, CHOLHDL, LDLDIRECT in the last  72 hours. Thyroid Function Tests: Recent Labs    06/24/18 0521  TSH 1.367   Anemia Panel: No results for input(s): VITAMINB12, FOLATE,  FERRITIN, TIBC, IRON, RETICCTPCT in the last 72 hours. Sepsis Labs: No results for input(s): PROCALCITON, LATICACIDVEN in the last 168 hours.  Recent Results (from the past 240 hour(s))  Novel Coronavirus, NAA ([facility])     Status: None   Collection Time: 06/17/18 12:00 AM  Result Value Ref Range Status   SARS-CoV-2, NAA Not Detected Not Detected Final    Comment: This test was developed and its performance characteristics determined by [facility]. This test has not been FDA cleared or approved. This test has been authorized by FDA under an Emergency Use Authorization (EUA). This test is only authorized for the duration of time the declaration that circumstances exist justifying the authorization of the emergency use of in vitro diagnostic tests for detection of SARS-CoV-2 virus and/or diagnosis of COVID-19 infection under section 564(b)(1) of the Act, 21 U.S.C. 161WRU-0(A)(5360bbb-3(b)(1), unless the authorization is terminated or revoked sooner. When diagnostic testing is negative, the possibility of a false negative result should be considered in the context of a patient's recent exposures and the presence of clinical signs and symptoms consistent with COVID-19. An individual without symptoms of COVID-19 and who is not shedding SARS-CoV-2 virus would expect to have a negative (not detected) result in this assay.   SARS Coronavirus 2 (CEPHEID - Performed in [facility] lab), Hosp Order     Status: None   Collection Time: 06/23/18 10:55 PM   Specimen: Nasopharyngeal Swab  Result Value Ref Range Status   SARS Coronavirus 2 NEGATIVE NEGATIVE Final    Comment: (NOTE) If result is NEGATIVE SARS-CoV-2 target nucleic acids are NOT DETECTED. The SARS-CoV-2 RNA is generally detectable in upper and lower  respiratory specimens during the acute phase of infection. The lowest  concentration of SARS-CoV-2 viral copies this assay can detect is 250  copies / mL. A negative result does  not preclude SARS-CoV-2 infection  and should not be used as the sole basis for treatment or other  patient management decisions.  A negative result may occur with  improper specimen collection / handling, submission of specimen other  than nasopharyngeal swab, presence of viral mutation(s) within the  areas targeted by this assay, and inadequate number of viral copies  (<250 copies / mL). A negative result must be combined with clinical  observations, patient history, and epidemiological information. If result is POSITIVE SARS-CoV-2 target nucleic acids are DETECTED. The SARS-CoV-2 RNA is generally detectable in upper and lower  respiratory specimens dur ing the acute phase of infection.  Positive  results are indicative of active infection with SARS-CoV-2.  Clinical  correlation with patient history and other diagnostic information is  necessary to determine patient infection status.  Positive results do  not rule out bacterial infection or co-infection with other viruses. If result is PRESUMPTIVE POSTIVE SARS-CoV-2 nucleic acids MAY BE PRESENT.   A presumptive positive result was obtained on the submitted specimen  and confirmed on repeat testing.  While 2019 novel coronavirus  (SARS-CoV-2) nucleic acids may be present in the submitted sample  additional confirmatory testing may be necessary for epidemiological  and / or clinical management purposes  to differentiate between  SARS-CoV-2 and other Sarbecovirus currently known to infect humans.  If clinically indicated additional testing with an alternate test  methodology [phone]([id number]) is advised. The SARS-CoV-2 RNA is generally  detectable in upper  and lower respiratory sp ecimens during the acute  phase of infection. The expected result is Negative. Fact Sheet for Patients:  BoilerBrush.com.cyhttps://www.fda.gov/media/136312/download Fact Sheet for Healthcare Providers: https://pope.com/https://www.fda.gov/media/136313/download This test is not yet approved or  cleared by the [place] FDA and has been authorized for detection and/or diagnosis of SARS-CoV-2 by FDA under an Emergency Use Authorization (EUA).  This EUA will remain in effect (meaning this test can be used) for the duration of the COVID-19 declaration under Section 564(b)(1) of the Act, 21 U.S.C. section 360bbb-3(b)(1), unless the authorization is terminated or revoked sooner. Performed at [facility], [address] [place], [place] [id number]        Radiology Studies: No results found.    Scheduled Meds: . feeding supplement  1 Container Oral TID BM  . folic acid  1 mg Intravenous Daily  . pantoprazole (PROTONIX) IV  40 mg Intravenous Q12H  . thiamine injection  100 mg Intravenous Daily   Continuous Infusions:   LOS: 1 day    Time spent: 35 minutes   Noralee StainJennifer Eriyana Sweeten, DO [facility] [email or web address] 06/25/2018, 11:24 AM

## 2018-06-26 LAB — CBC
HCT: 23.4 % — ABNORMAL LOW (ref 39.0–52.0)
Hemoglobin: 7.8 g/dL — ABNORMAL LOW (ref 13.0–17.0)
MCH: 32.5 pg (ref 26.0–34.0)
MCHC: 33.3 g/dL (ref 30.0–36.0)
MCV: 97.5 fL (ref 80.0–100.0)
Platelets: 207 10*3/uL (ref 150–400)
RBC: 2.4 MIL/uL — ABNORMAL LOW (ref 4.22–5.81)
RDW: 15 % (ref 11.5–15.5)
WBC: 4.2 10*3/uL (ref 4.0–10.5)
nRBC: 0 % (ref 0.0–0.2)

## 2018-06-26 MED ORDER — FOLIC ACID 1 MG PO TABS: 1.0000 mg | ORAL_TABLET | Freq: Every day | ORAL | 0 refills | Status: DC

## 2018-06-26 MED ORDER — THIAMINE HCL 100 MG PO TABS: 100.0000 mg | ORAL_TABLET | Freq: Every day | ORAL | 0 refills | Status: DC

## 2018-06-26 MED ORDER — PANTOPRAZOLE SODIUM 40 MG PO TBEC: 40.0000 mg | DELAYED_RELEASE_TABLET | Freq: Two times a day (BID) | ORAL | 0 refills | Status: DC

## 2018-06-26 MED ORDER — LISINOPRIL 20 MG PO TABS: 20.0000 mg | ORAL_TABLET | Freq: Every day | ORAL | 0 refills | Status: DC

## 2018-06-26 NOTE — Discharge Summary (Signed)
Physician Discharge Summary  Rodney Hartman EAV:[id number] DOB: 1960/09/04 DOA: 06/23/2018  PCP: Patient, No Pcp Per  Admit date: 06/23/2018 Discharge date: 06/26/2018  Admitted From: Homeless Disposition:  Homeless, resources provided  Recommendations for Outpatient Follow-up:  1. Follow up with PCP in 1 week 2. Follow up with GI on 07/03/2018  3. Please obtain CBC in 1 week  4. Please follow up on the following pending results: biopsies from EGD  5. Avoid aspirin/NSAIDS 6. Stop alcohol intake   Discharge Condition: Stable CODE STATUS: Full  Diet recommendation: Regular diet   Brief/Interim Summary: Rodney Hartman a 58 y.o.malewith medical history significant of homelessness, alcohol abuse, subdural hematoma status post head injury,delirium tremens psychosis, peptic ulcer disease, anemia,andhypertension presented to ER on 06/23/2018 with complaint of 2-day history of nausea and vomiting of dark-colored vomitus as well as dark-colored stools and abdominal pain. Patient has been using Goody powder to help his stomach pains. He also drinks few alcoholic drinks per week and he claimed that his last drink was Thursday. He also has a remote history of peptic ulcer disease about 20 years ago at some outside facility. He was on to be anemic with hemoglobin over 8 with possible diagnosis of upper GI bleed and admitted under hospitalist service and GI has been consulted.   Patient underwent EGD on 6/16 which found nonbleeding gastric ulcer with no stigmata of bleeding, nonbleeding erosive gastropathy, erythematous mucosa in the gastric body and antrum. Hgb remained stable and patient was tolerating diet prior to discharge.   Discharge Diagnoses:  Hematemesis and acute blood loss anemia -Has had one episode of hematemesis since admission -Status post EGD 6/16 which revealed nonbleeding esophagitis with nonbleeding gastric ulcer, nonbleeding gastric erosion, gastric erythema.  Biopsies  pending, GI will notify him of results -Protonix twice daily for 1 month then once daily  -Avoid NSAIDs  Alcohol abuse -Blood alcohol level less than 10 upon admission. History of drinking several drinks of alcohol weekly and history of withdrawal in the past however does not seem to be having any signs of withdrawal at this point in time.  -Folic acid and thiamine ordered  Homelessness -SW consulted  Discharge Instructions  Discharge Instructions    Call MD for:  difficulty breathing, headache or visual disturbances   Complete by: As directed    Call MD for:  extreme fatigue   Complete by: As directed    Call MD for:  persistant dizziness or light-headedness   Complete by: As directed    Call MD for:  persistant nausea and vomiting   Complete by: As directed    Call MD for:  temperature >100.4   Complete by: As directed    Diet general   Complete by: As directed    Discharge instructions   Complete by: As directed    You were cared for by a hospitalist during your hospital stay. If you have any questions about your discharge medications or the care you received while you were in the hospital after you are discharged, you can call the unit and ask to speak with the hospitalist on call if the hospitalist that took care of you is not available. Once you are discharged, your primary care physician will handle any further medical issues. Please note that NO REFILLS for any discharge medications will be authorized once you are discharged, as it is imperative that you return to your primary care physician (or establish a relationship with a primary care physician if you  do not have one) for your aftercare needs so that they can reassess your need for medications and monitor your lab values.   Discharge instructions   Complete by: As directed    STOP ALL NSAID, ASPIRIN, GOODY POWER, ALEVE, ADVIL, MOTRIN, IBUPROFEN.  YOU MAY TAKE TYLENOL OVER THE COUNTER.  STOP ALL ALCOHOL USE.    Increase activity slowly   Complete by: As directed      Allergies as of 06/26/2018   No Known Allergies     Medication List    STOP taking these medications   Goodys Extra Strength 500-325-65 MG Pack Generic drug: Aspirin-Acetaminophen-Caffeine   levETIRAcetam 500 MG tablet Commonly known as: Keppra   naproxen sodium 550 MG tablet Commonly known as: ANAPROX     TAKE these medications   folic acid 1 MG tablet Commonly known as: FOLVITE Take 1 tablet (1 mg total) by mouth daily. Start taking on: June 27, 2018   lisinopril 20 MG tablet Commonly known as: ZESTRIL Take 1 tablet (20 mg total) by mouth daily.   pantoprazole 40 MG tablet Commonly known as: PROTONIX Take 1 tablet (40 mg total) by mouth 2 (two) times daily before a meal.   thiamine 100 MG tablet Take 1 tablet (100 mg total) by mouth daily. Start taking on: June 27, 2018      Follow-up Information    Meredith Pel, NP Follow up on 07/03/2018.   Specialty: Gastroenterology Why: at 9:30am.  Contact information: [address] [place] [address] [phone]          No Known Allergies  Consultations:  GI   Procedures/Studies: No results found.  EGD 06/24/2018 Impression: - LA Grade B reflux esophagitis. - Non-bleeding gastric ulcer with no stigmata of  bleeding. - Non-bleeding erosive gastropathy. Biopsied. - Erythematous mucosa in the gastric body and  antrum. Biopsied. - Normal duodenal bulb and second portion of the  duodenum.    Discharge Exam: Vitals:   06/25/18 1953 06/26/18 0541  BP: (!) 144/87 (!) 141/88  Pulse: 82 78  Resp: 18 16  Temp: 98.9 F (37.2 C) 98.5 F (36.9 C)  SpO2: 100% 100%    General: Pt is alert, awake, not in acute distress Cardiovascular: RRR, S1/S2 +, no  rubs, no gallops Respiratory: CTA bilaterally, no wheezing, no rhonchi Abdominal: Soft, ND, bowel sounds + Extremities: no edema, no cyanosis    The results of significant diagnostics from this hospitalization (including imaging, microbiology, ancillary and laboratory) are listed below for reference.     Microbiology: Recent Results (from the past 240 hour(s))  Novel Coronavirus, NAA ([facility])     Status: None   Collection Time: 06/17/18 12:00 AM  Result Value Ref Range Status   SARS-CoV-2, NAA Not Detected Not Detected Final    Comment: This test was developed and its performance characteristics determined by [employer]. This test has not been FDA cleared or approved. This test has been authorized by FDA under an Emergency Use Authorization (EUA). This test is only authorized for the duration of time the declaration that circumstances exist justifying the authorization of the emergency use of in vitro diagnostic tests for detection of SARS-CoV-2 virus and/or diagnosis of COVID-19 infection under section 564(b)(1) of the Act, 21 U.S.C. 914NWG-9(F)(6), unless the authorization is terminated or revoked sooner. When diagnostic testing is negative, the possibility of a false negative result should be considered in the context of a patient's recent exposures and the presence of clinical signs and  symptoms consistent with COVID-19. An individual without symptoms of COVID-19 and who is not shedding SARS-CoV-2 virus would expect to have a negative (not detected) result in this assay.   SARS Coronavirus 2 (CEPHEID - Performed in [facility] lab), Hosp Order     Status: None   Collection Time: 06/23/18 10:55 PM   Specimen: Nasopharyngeal Swab  Result Value Ref Range Status   SARS Coronavirus 2 NEGATIVE NEGATIVE Final    Comment: (NOTE) If result is NEGATIVE SARS-CoV-2 target nucleic acids are NOT DETECTED. The SARS-CoV-2 RNA is generally detectable in upper and lower   respiratory specimens during the acute phase of infection. The lowest  concentration of SARS-CoV-2 viral copies this assay can detect is 250  copies / mL. A negative result does not preclude SARS-CoV-2 infection  and should not be used as the sole basis for treatment or other  patient management decisions.  A negative result may occur with  improper specimen collection / handling, submission of specimen other  than nasopharyngeal swab, presence of viral mutation(s) within the  areas targeted by this assay, and inadequate number of viral copies  (<250 copies / mL). A negative result must be combined with clinical  observations, patient history, and epidemiological information. If result is POSITIVE SARS-CoV-2 target nucleic acids are DETECTED. The SARS-CoV-2 RNA is generally detectable in upper and lower  respiratory specimens dur ing the acute phase of infection.  Positive  results are indicative of active infection with SARS-CoV-2.  Clinical  correlation with patient history and other diagnostic information is  necessary to determine patient infection status.  Positive results do  not rule out bacterial infection or co-infection with other viruses. If result is PRESUMPTIVE POSTIVE SARS-CoV-2 nucleic acids MAY BE PRESENT.   A presumptive positive result was obtained on the submitted specimen  and confirmed on repeat testing.  While 2019 novel coronavirus  (SARS-CoV-2) nucleic acids may be present in the submitted sample  additional confirmatory testing may be necessary for epidemiological  and / or clinical management purposes  to differentiate between  SARS-CoV-2 and other Sarbecovirus currently known to infect humans.  If clinically indicated additional testing with an alternate test  methodology [phone]([id number]) is advised. The SARS-CoV-2 RNA is generally  detectable in upper and lower respiratory sp ecimens during the acute  phase of infection. The expected result is Negative. Fact  Sheet for Patients:  BoilerBrush.com.cyhttps://www.fda.gov/media/136312/download Fact Sheet for Healthcare Providers: https://pope.com/https://www.fda.gov/media/136313/download This test is not yet approved or cleared by the [place] FDA and has been authorized for detection and/or diagnosis of SARS-CoV-2 by FDA under an Emergency Use Authorization (EUA).  This EUA will remain in effect (meaning this test can be used) for the duration of the COVID-19 declaration under Section 564(b)(1) of the Act, 21 U.S.C. section 360bbb-3(b)(1), unless the authorization is terminated or revoked sooner. Performed at [facility], [address] [place], [place] [address]      Labs: BNP (last 3 results) No results for input(s): BNP in the last 8760 hours. Basic Metabolic Panel: Recent Labs  Lab 06/23/18 2129 06/23/18 2334 06/24/18 0521 06/25/18 0529  NA 138  --  139 139  K 2.8*  --  3.0* 4.2  CL 100  --  107 111  CO2 27  --  23 23  GLUCOSE 93  --  95 98  BUN 29*  --  23* 11  CREATININE 0.82  --  0.74 0.80  CALCIUM 9.9  --  8.8* 8.4*  MG  --  1.9 1.8  --   PHOS  --  3.2 3.3  --    Liver Function Tests: Recent Labs  Lab 06/23/18 2129 06/24/18 0521  AST 36 30  ALT 45* 36  ALKPHOS 58 44  BILITOT 1.2 0.9  PROT 7.7 5.9*  ALBUMIN 4.3 3.3*   No results for input(s): LIPASE, AMYLASE in the last 168 hours. No results for input(s): AMMONIA in the last 168 hours. CBC: Recent Labs  Lab 06/23/18 2129  06/24/18 0945 06/24/18 1610 06/24/18 2031 06/25/18 0529 06/26/18 0532  WBC 7.4   < > 4.1 4.0 4.0 4.0 4.2  NEUTROABS 6.0  --   --   --   --   --   --   HGB 9.8*   < > 7.7* 8.9* 7.6* 7.7* 7.8*  HCT 30.0*   < > 23.9* 27.7* 23.8* 23.4* 23.4*  MCV 94.6   < > 95.6 99.3 100.4* 98.3 97.5  PLT 180   < > 158 166 158 165 207   < > = values in this interval not displayed.   Cardiac Enzymes: No results for input(s): CKTOTAL, CKMB, CKMBINDEX, TROPONINI in the last 168 hours. BNP: Invalid input(s):  POCBNP CBG: No results for input(s): GLUCAP in the last 168 hours. D-Dimer No results for input(s): DDIMER in the last 72 hours. Hgb A1c No results for input(s): HGBA1C in the last 72 hours. Lipid Profile No results for input(s): CHOL, HDL, LDLCALC, TRIG, CHOLHDL, LDLDIRECT in the last 72 hours. Thyroid function studies Recent Labs    06/24/18 0521  TSH 1.367   Anemia work up No results for input(s): VITAMINB12, FOLATE, FERRITIN, TIBC, IRON, RETICCTPCT in the last 72 hours. Urinalysis    Component Value Date/Time   COLORURINE AMBER (A) 11/12/2016 1749   APPEARANCEUR HAZY (A) 11/12/2016 1749   LABSPEC 1.029 11/12/2016 1749   PHURINE 5.0 11/12/2016 1749   GLUCOSEU NEGATIVE 11/12/2016 1749   HGBUR NEGATIVE 11/12/2016 1749   BILIRUBINUR NEGATIVE 11/12/2016 [place] 11/12/2016 1749   PROTEINUR 30 (A) 11/12/2016 1749   UROBILINOGEN 0.2 05/16/2009 1145   NITRITE NEGATIVE 11/12/2016 1749   LEUKOCYTESUR NEGATIVE 11/12/2016 1749   Sepsis Labs Invalid input(s): PROCALCITONIN,  WBC,  LACTICIDVEN Microbiology Recent Results (from the past 240 hour(s))  Novel Coronavirus, NAA ([facility])     Status: None   Collection Time: 06/17/18 12:00 AM  Result Value Ref Range Status   SARS-CoV-2, NAA Not Detected Not Detected Final    Comment: This test was developed and its performance characteristics determined by [employer]. This test has not been FDA cleared or approved. This test has been authorized by FDA under an Emergency Use Authorization (EUA). This test is only authorized for the duration of time the declaration that circumstances exist justifying the authorization of the emergency use of in vitro diagnostic tests for detection of SARS-CoV-2 virus and/or diagnosis of COVID-19 infection under section 564(b)(1) of the Act, 21 U.S.C. 027OZD-6(U)(4), unless the authorization is terminated or revoked sooner. When diagnostic testing is negative, the possibility  of a false negative result should be considered in the context of a patient's recent exposures and the presence of clinical signs and symptoms consistent with COVID-19. An individual without symptoms of COVID-19 and who is not shedding SARS-CoV-2 virus would expect to have a negative (not detected) result in this assay.   SARS Coronavirus 2 (CEPHEID - Performed in [place] hospital lab), [facility]  Status: None   Collection Time: 06/23/18 10:55 PM   Specimen: Nasopharyngeal Swab  Result Value Ref Range Status   SARS Coronavirus 2 NEGATIVE NEGATIVE Final    Comment: (NOTE) If result is NEGATIVE SARS-CoV-2 target nucleic acids are NOT DETECTED. The SARS-CoV-2 RNA is generally detectable in upper and lower  respiratory specimens during the acute phase of infection. The lowest  concentration of SARS-CoV-2 viral copies this assay can detect is 250  copies / mL. A negative result does not preclude SARS-CoV-2 infection  and should not be used as the sole basis for treatment or other  patient management decisions.  A negative result may occur with  improper specimen collection / handling, submission of specimen other  than nasopharyngeal swab, presence of viral mutation(s) within the  areas targeted by this assay, and inadequate number of viral copies  (<250 copies / mL). A negative result must be combined with clinical  observations, patient history, and epidemiological information. If result is POSITIVE SARS-CoV-2 target nucleic acids are DETECTED. The SARS-CoV-2 RNA is generally detectable in upper and lower  respiratory specimens dur ing the acute phase of infection.  Positive  results are indicative of active infection with SARS-CoV-2.  Clinical  correlation with patient history and other diagnostic information is  necessary to determine patient infection status.  Positive results do  not rule out bacterial infection or co-infection with other viruses. If result is PRESUMPTIVE  POSTIVE SARS-CoV-2 nucleic acids MAY BE PRESENT.   A presumptive positive result was obtained on the submitted specimen  and confirmed on repeat testing.  While 2019 novel coronavirus  (SARS-CoV-2) nucleic acids may be present in the submitted sample  additional confirmatory testing may be necessary for epidemiological  and / or clinical management purposes  to differentiate between  SARS-CoV-2 and other Sarbecovirus currently known to infect humans.  If clinically indicated additional testing with an alternate test  methodology [phone]([id number]) is advised. The SARS-CoV-2 RNA is generally  detectable in upper and lower respiratory sp ecimens during the acute  phase of infection. The expected result is Negative. Fact Sheet for Patients:  BoilerBrush.com.cyhttps://www.fda.gov/media/136312/download Fact Sheet for Healthcare Providers: https://pope.com/https://www.fda.gov/media/136313/download This test is not yet approved or cleared by the [place] FDA and has been authorized for detection and/or diagnosis of SARS-CoV-2 by FDA under an Emergency Use Authorization (EUA).  This EUA will remain in effect (meaning this test can be used) for the duration of the COVID-19 declaration under Section 564(b)(1) of the Act, 21 U.S.C. section 360bbb-3(b)(1), unless the authorization is terminated or revoked sooner. Performed at [facility], [address] [place], [place] [address]      Patient was seen and examined on the day of discharge and was found to be in stable condition. Time coordinating discharge: 25 minutes including assessment and coordination of care, as well as examination of the patient.   SIGNED:  Noralee StainJennifer Jaysen Wey, DO [facility] [email or web address] 06/26/2018, 9:41 AM

## 2018-06-26 NOTE — Discharge Instructions (Signed)
Gastrointestinal Bleeding ° °Gastrointestinal bleeding is bleeding somewhere along the path food travels through the body (digestive tract). This path is anywhere between the mouth and the opening of the butt (anus). You may have blood in your poop (stools) or have black poop. If you throw up (vomit), there may be blood in it. °This condition can be mild, serious, or even life-threatening. If you have a lot of bleeding, you may need to stay in the hospital. °Follow these instructions at home: °· Take over-the-counter and prescription medicines only as told by your doctor. °· Eat foods that have a lot of fiber in them. These foods include whole grains, fruits, and vegetables. You can also try eating 1-3 prunes each day. °· Drink enough fluid to keep your pee (urine) clear or pale yellow. °· Keep all follow-up visits as told by your doctor. This is important. °Contact a doctor if: °· Your symptoms do not get better. °Get help right away if: °· Your bleeding gets worse. °· You feel dizzy or you pass out (faint). °· You feel weak. °· You have very bad cramps in your back or belly (abdomen). °· You pass large clumps of blood (clots) in your poop. °· Your symptoms are getting worse. °This information is not intended to replace advice given to you by your health care provider. Make sure you discuss any questions you have with your health care provider. °Document Released: 10/04/2007 Document Revised: 06/02/2015 Document Reviewed: 06/14/2014 °Elsevier Interactive Patient Education © 2019 [employer] Inc[employer] ° °

## 2018-06-26 NOTE — Progress Notes (Signed)
Patient was given discharge instructions with no immediate questions or concerns. Pt will be accompanied by nurse tech downstairs for discharge.

## 2018-06-30 NOTE — Progress Notes (Deleted)
   Subjective:    Patient ID: Rodney Hartman, male    DOB: May 29, 1960, 58 y.o.   MRN: [id number]   Admit date: 06/23/2018 Discharge date: 06/26/2018  Admitted From: Homeless Disposition:  Homeless, resources provided  Recommendations for Outpatient Follow-up:  1. Follow up with PCP in 1 week 2. Follow up with GI on 07/03/2018  3. Please obtain CBC in 1 week  4. Please follow up on the following pending results: biopsies from EGD  5. Avoid aspirin/NSAIDS 6. Stop alcohol intake   Discharge Condition: Stable CODE STATUS: Full  Diet recommendation: Regular diet   Brief/Interim Summary: Rodney Hartman a 58 y.o.malewith medical history significant of homelessness, alcohol abuse, subdural hematoma status post head injury,delirium tremens psychosis,peptic ulcer disease,anemia,andhypertension presented to ER on 06/23/2018 with complaint of 2-day history of nausea and vomiting of dark-colored vomitus as well as dark-colored stools and abdominal pain. Patient has been using Goody powder to help his stomach pains. He also drinks few alcoholic drinks per week and he claimed that his last drink was Thursday. He also has a remote history of peptic ulcer disease about 20 years ago at some outside facility. He was on to be anemic with hemoglobin over 8 with possible diagnosis of upper GI bleed and admitted under hospitalist service and GI has been consulted.Patient underwent EGD on 6/16 which found nonbleeding gastric ulcer with no stigmata of bleeding, nonbleeding erosive gastropathy, erythematous mucosa in the gastric body and antrum. Hgb remained stable and patient was tolerating diet prior to discharge.   Discharge Diagnoses:  Hematemesisand acute blood loss anemia -Has had one episode of hematemesis since admission -Status post EGD 6/16 which revealed nonbleeding esophagitis with nonbleeding gastric ulcer, nonbleeding gastric erosion, gastric erythema.Biopsies pending, GI will  notify him of results -Protonix twice dailyfor 1 month then once daily -Avoid NSAIDs  Alcohol abuse -Blood alcohol level less than 10 upon admission. History of drinking several drinks of alcohol weekly and history of withdrawal in the past however does not seem to be having any signs of withdrawal at this point in time. -Folic acid and thiamine ordered  Homelessness -SWconsulted     Review of Systems     Objective:   Physical Exam        Assessment & Plan:

## 2018-07-01 ENCOUNTER — Inpatient Hospital Stay: Payer: Self-pay | Admitting: Critical Care Medicine

## 2018-07-03 ENCOUNTER — Ambulatory Visit: Payer: Self-pay | Admitting: Nurse Practitioner

## 2018-08-20 ENCOUNTER — Emergency Department (HOSPITAL_COMMUNITY)
Admission: EM | Admit: 2018-08-20 | Discharge: 2018-08-21 | Disposition: A | Discharge status: Home / Self Care | Payer: Self-pay | Attending: Emergency Medicine | Admitting: Emergency Medicine

## 2018-08-20 ENCOUNTER — Emergency Department (HOSPITAL_COMMUNITY): Payer: Self-pay

## 2018-08-20 ENCOUNTER — Other Ambulatory Visit: Payer: Self-pay

## 2018-08-20 ENCOUNTER — Encounter (HOSPITAL_COMMUNITY): Payer: Self-pay

## 2018-08-20 DIAGNOSIS — M545 Low back pain, unspecified: Secondary | ICD-10-CM

## 2018-08-20 DIAGNOSIS — G8929 Other chronic pain: Secondary | ICD-10-CM

## 2018-08-20 DIAGNOSIS — F17210 Nicotine dependence, cigarettes, uncomplicated: Secondary | ICD-10-CM | POA: Insufficient documentation

## 2018-08-20 DIAGNOSIS — R51 Headache: Secondary | ICD-10-CM | POA: Insufficient documentation

## 2018-08-20 DIAGNOSIS — S20211A Contusion of right front wall of thorax, initial encounter: Secondary | ICD-10-CM

## 2018-08-20 DIAGNOSIS — Y939 Activity, unspecified: Secondary | ICD-10-CM | POA: Insufficient documentation

## 2018-08-20 DIAGNOSIS — Y999 Unspecified external cause status: Secondary | ICD-10-CM | POA: Insufficient documentation

## 2018-08-20 DIAGNOSIS — S0990XA Unspecified injury of head, initial encounter: Secondary | ICD-10-CM

## 2018-08-20 DIAGNOSIS — Z23 Encounter for immunization: Secondary | ICD-10-CM | POA: Insufficient documentation

## 2018-08-20 DIAGNOSIS — Y929 Unspecified place or not applicable: Secondary | ICD-10-CM | POA: Insufficient documentation

## 2018-08-20 DIAGNOSIS — S0101XA Laceration without foreign body of scalp, initial encounter: Secondary | ICD-10-CM

## 2018-08-20 DIAGNOSIS — Z79899 Other long term (current) drug therapy: Secondary | ICD-10-CM | POA: Insufficient documentation
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
## 2018-08-21 ENCOUNTER — Emergency Department (HOSPITAL_COMMUNITY): Payer: Self-pay

## 2018-08-21 MED ORDER — NAPROXEN 500 MG PO TABS: 500.0000 mg | ORAL_TABLET | Freq: Two times a day (BID) | ORAL | 0 refills | Status: DC | PRN

## 2018-08-21 MED ORDER — ACETAMINOPHEN 325 MG PO TABS: 650.0000 mg | ORAL_TABLET | Freq: Once | ORAL | Status: DC

## 2018-08-21 MED ORDER — TETANUS-DIPHTH-ACELL PERTUSSIS 5-2.5-18.5 LF-MCG/0.5 IM SUSP
0.5000 mL | Freq: Once | INTRAMUSCULAR | Status: AC
  Administered 2018-08-21: 02:00:00 0.5 mL via INTRAMUSCULAR
  Filled 2018-08-21: qty 0.5

## 2018-08-21 MED ORDER — NAPROXEN 250 MG PO TABS
500.0000 mg | ORAL_TABLET | Freq: Once | ORAL | Status: AC
  Administered 2018-08-21: 500 mg via ORAL
  Filled 2018-08-21: qty 2

## 2018-08-21 MED ORDER — OXYCODONE-ACETAMINOPHEN 5-325 MG PO TABS
1.0000 | ORAL_TABLET | Freq: Once | ORAL | Status: AC
  Administered 2018-08-21: 1 via ORAL
  Filled 2018-08-21: qty 1

## 2018-08-21 NOTE — ED Notes (Signed)
Patient transported to X-ray 
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
## 2019-04-14 ENCOUNTER — Encounter (HOSPITAL_COMMUNITY): Payer: Self-pay | Admitting: Emergency Medicine

## 2019-04-14 ENCOUNTER — Emergency Department (HOSPITAL_COMMUNITY): Payer: Self-pay

## 2019-04-14 ENCOUNTER — Other Ambulatory Visit: Payer: Self-pay

## 2019-04-14 ENCOUNTER — Emergency Department (HOSPITAL_COMMUNITY)
Admission: EM | Admit: 2019-04-14 | Discharge: 2019-04-14 | Disposition: A | Discharge status: Home / Self Care | Payer: Self-pay | Attending: Emergency Medicine | Admitting: Emergency Medicine

## 2019-04-14 DIAGNOSIS — Z79899 Other long term (current) drug therapy: Secondary | ICD-10-CM | POA: Insufficient documentation

## 2019-04-14 DIAGNOSIS — R531 Weakness: Secondary | ICD-10-CM | POA: Insufficient documentation

## 2019-04-14 DIAGNOSIS — R200 Anesthesia of skin: Secondary | ICD-10-CM | POA: Insufficient documentation

## 2019-04-14 DIAGNOSIS — R29898 Other symptoms and signs involving the musculoskeletal system: Secondary | ICD-10-CM | POA: Insufficient documentation

## 2019-04-14 DIAGNOSIS — F17200 Nicotine dependence, unspecified, uncomplicated: Secondary | ICD-10-CM | POA: Insufficient documentation

## 2019-04-14 DIAGNOSIS — M5416 Radiculopathy, lumbar region: Secondary | ICD-10-CM | POA: Insufficient documentation

## 2019-04-14 DIAGNOSIS — R358 Other polyuria: Secondary | ICD-10-CM | POA: Insufficient documentation

## 2019-04-14 DIAGNOSIS — I10 Essential (primary) hypertension: Secondary | ICD-10-CM | POA: Insufficient documentation

## 2019-04-14 DIAGNOSIS — M79604 Pain in right leg: Secondary | ICD-10-CM | POA: Insufficient documentation

## 2019-04-14 DIAGNOSIS — R2243 Localized swelling, mass and lump, lower limb, bilateral: Secondary | ICD-10-CM | POA: Insufficient documentation

## 2019-04-14 LAB — BASIC METABOLIC PANEL
Anion gap: 11 (ref 5–15)
BUN: 7 mg/dL (ref 6–20)
CO2: 24 mmol/L (ref 22–32)
Calcium: 8.9 mg/dL (ref 8.9–10.3)
Chloride: 107 mmol/L (ref 98–111)
Creatinine, Ser: 0.94 mg/dL (ref 0.61–1.24)
GFR calc Af Amer: >60 mL/min (ref >60)
GFR calc non Af Amer: >60 mL/min (ref >60)
Glucose, Bld: 100 mg/dL — ABNORMAL HIGH (ref 70–99)
Potassium: 4 mmol/L (ref 3.5–5.1)
Sodium: 142 mmol/L (ref 135–145)

## 2019-04-14 LAB — CBC
HCT: 35.6 % — ABNORMAL LOW (ref 39.0–52.0)
Hemoglobin: 11.8 g/dL — ABNORMAL LOW (ref 13.0–17.0)
MCH: 30.8 pg (ref 26.0–34.0)
MCHC: 33.1 g/dL (ref 30.0–36.0)
MCV: 93 fL (ref 80.0–100.0)
Platelets: 344 10*3/uL (ref 150–400)
RBC: 3.83 MIL/uL — ABNORMAL LOW (ref 4.22–5.81)
RDW: 15.7 % — ABNORMAL HIGH (ref 11.5–15.5)
WBC: 3.5 10*3/uL — ABNORMAL LOW (ref 4.0–10.5)
nRBC: 0 % (ref 0.0–0.2)

## 2019-04-14 MED ORDER — DEXAMETHASONE SODIUM PHOSPHATE 10 MG/ML IJ SOLN
10.0000 mg | Freq: Once | INTRAMUSCULAR | Status: AC
  Administered 2019-04-14: 10 mg via INTRAMUSCULAR
  Filled 2019-04-14: qty 1

## 2019-04-14 MED ORDER — HYDROMORPHONE HCL 1 MG/ML IJ SOLN
1.0000 mg | Freq: Once | INTRAMUSCULAR | Status: AC
  Administered 2019-04-14: 1 mg via INTRAMUSCULAR
  Filled 2019-04-14: qty 1

## 2019-04-14 MED ORDER — PREDNISONE 20 MG PO TABS: 40.0000 mg | ORAL_TABLET | Freq: Every day | ORAL | 0 refills | Status: DC

## 2019-04-14 MED ORDER — HYDROCODONE-ACETAMINOPHEN 5-325 MG PO TABS: 1.0000 | ORAL_TABLET | Freq: Four times a day (QID) | ORAL | 0 refills | Status: AC | PRN

## 2019-04-14 MED ORDER — DICLOFENAC EPOLAMINE 1.3 % EX PTCH
1.0000 | MEDICATED_PATCH | Freq: Two times a day (BID) | CUTANEOUS | Status: DC
  Administered 2019-04-14: 16:00:00 1 via TRANSDERMAL
  Filled 2019-04-14 (×2): qty 1
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
## 2019-04-14 NOTE — Discharge Instructions (Addendum)
As discussed, your evaluation today has been largely reassuring.  But, it is important that you monitor your condition carefully, and do not hesitate to return to the ED if you develop new, or concerning changes in your condition.  You have been diagnosed with lower extremity weakness and radiculopathy of the lumbar region.  In addition to your prescribed medication, please obtain and use patches including the ingredients methyl salicylate and lidocaine.  These are available over-the-counter at any local pharmacy.

## 2019-04-14 NOTE — ED Triage Notes (Signed)
Pt arrives to ED from home with complaints of chronic back pain that has worsened over the last month. Patient states he has pain that now radiates to both legs. Patient states he is here today because he can hardly walk and that the pain is unbearable.

## 2019-04-24 ENCOUNTER — Other Ambulatory Visit: Payer: Self-pay

## 2019-04-24 ENCOUNTER — Emergency Department (HOSPITAL_COMMUNITY)
Admission: EM | Admit: 2019-04-24 | Discharge: 2019-04-24 | Disposition: A | Discharge status: Home / Self Care | Payer: Self-pay | Attending: Emergency Medicine | Admitting: Emergency Medicine

## 2019-04-24 ENCOUNTER — Encounter (HOSPITAL_COMMUNITY): Payer: Self-pay | Admitting: *Deleted

## 2019-04-24 DIAGNOSIS — M545 Low back pain, unspecified: Secondary | ICD-10-CM

## 2019-04-24 DIAGNOSIS — Z79899 Other long term (current) drug therapy: Secondary | ICD-10-CM | POA: Insufficient documentation

## 2019-04-24 DIAGNOSIS — F17210 Nicotine dependence, cigarettes, uncomplicated: Secondary | ICD-10-CM | POA: Insufficient documentation

## 2019-04-24 DIAGNOSIS — I10 Essential (primary) hypertension: Secondary | ICD-10-CM | POA: Insufficient documentation

## 2019-04-24 DIAGNOSIS — G8929 Other chronic pain: Secondary | ICD-10-CM

## 2019-04-24 MED ORDER — KETOROLAC TROMETHAMINE 30 MG/ML IJ SOLN
15.0000 mg | Freq: Once | INTRAMUSCULAR | Status: AC
  Administered 2019-04-24: 15 mg via INTRAVENOUS
  Filled 2019-04-24: qty 1

## 2019-04-24 MED ORDER — PREDNISONE 10 MG (21) PO TBPK: ORAL_TABLET | Freq: Every day | ORAL | 0 refills | Status: DC

## 2019-04-24 NOTE — ED Triage Notes (Signed)
Pt has chronic low back pain, Two hours ago pain increased. Pt ambulatory with EMS. Pt in bathroom in triage and able to walk to Triage 7.
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
## 2019-04-24 NOTE — Discharge Instructions (Addendum)
Take prednisone as directed.   Return to the emergency department immediately if you experience any back pain associated with fevers, loss of control of your bowels/bladder, weakness/numbness to your legs, numbness to your groin area, inability to walk, or inability to urinate.

## 2019-04-30 ENCOUNTER — Emergency Department (HOSPITAL_COMMUNITY): Payer: Self-pay

## 2019-04-30 ENCOUNTER — Emergency Department (HOSPITAL_COMMUNITY)
Admission: EM | Admit: 2019-04-30 | Discharge: 2019-04-30 | Disposition: A | Discharge status: Home / Self Care | Payer: Self-pay | Attending: Emergency Medicine | Admitting: Emergency Medicine

## 2019-04-30 ENCOUNTER — Other Ambulatory Visit: Payer: Self-pay

## 2019-04-30 ENCOUNTER — Encounter (HOSPITAL_COMMUNITY): Payer: Self-pay

## 2019-04-30 DIAGNOSIS — I10 Essential (primary) hypertension: Secondary | ICD-10-CM | POA: Insufficient documentation

## 2019-04-30 DIAGNOSIS — F10920 Alcohol use, unspecified with intoxication, uncomplicated: Secondary | ICD-10-CM | POA: Insufficient documentation

## 2019-04-30 DIAGNOSIS — F17210 Nicotine dependence, cigarettes, uncomplicated: Secondary | ICD-10-CM | POA: Insufficient documentation

## 2019-04-30 DIAGNOSIS — W010XXA Fall on same level from slipping, tripping and stumbling without subsequent striking against object, initial encounter: Secondary | ICD-10-CM | POA: Insufficient documentation

## 2019-04-30 DIAGNOSIS — R4182 Altered mental status, unspecified: Secondary | ICD-10-CM | POA: Insufficient documentation

## 2019-04-30 NOTE — ED Triage Notes (Signed)
Pt brought by EMS due to ETOH intoxication and a fall. Pt states to EMS that he believes he lost consciousness after his fall. Pt has some swelling and abrasions noted to his nose. Pt is also complaining of right shoulder pain. Pt is alert.

## 2019-04-30 NOTE — Discharge Instructions (Addendum)
You were evaluated in the Emergency Department and after careful evaluation, we did not find any emergent condition requiring admission or further testing in the hospital.  Your exam/testing today is overall reassuring.  Please return to the Emergency Department if you experience any worsening of your condition.  We encourage you to follow up with a primary care provider.  Thank you for allowing us to be a part of your care. 

## 2019-04-30 NOTE — ED Provider Notes (Signed)
[facility]-EMERGENCY DEPT [facility] Emergency Department Provider Note MRN:  [id number]  Arrival date & time: 04/30/19     Chief Complaint   Fall and Alcohol Intoxication   History of Present Illness   Rodney Hartman is a 59 y.o. year-old male with a history of hypertension, alcohol abuse presenting to the ED with chief complaint of fall.  Patient admits to drinking heavily today.  Tripped and fell, striking his face against the ground.   I was unable to obtain an accurate HPI, PMH, or ROS due to the patient's intoxication.  Level 5 caveat.  Review of Systems  Positive for fall, intoxication.  Patient's Health History    Past Medical History:  Diagnosis Date  . Alcohol abuse   . Depression   . Hypertension   . PUD (peptic ulcer disease)     Past Surgical History:  Procedure Laterality Date  . BACK SURGERY    . BIOPSY  06/24/2018   Procedure: BIOPSY;  Surgeon: Meryl Dare, MD;  Location: [facility];  Service: Endoscopy;;  . ESOPHAGOGASTRODUODENOSCOPY N/A 06/24/2018   Procedure: ESOPHAGOGASTRODUODENOSCOPY (EGD);  Surgeon: Meryl Dare, MD;  Location: [facility];  Service: Endoscopy;  Laterality: N/A;  . 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   . TONSILLECTOMY      Family History  Problem Relation Age of Onset  . Heart disease Mother     Social History   Socioeconomic History  . Marital status: Single    Spouse name: Not on file  . Number of children: Not on file  . Years of education: Not on file  . Highest education level: Not on file  Occupational History  . Not on file  Tobacco Use  . Smoking status: Current Every Day Smoker  . Smokeless tobacco: Never Used  Substance and Sexual Activity  . Alcohol use: Yes    Comment: every other day   . Drug use: No  . Sexual activity: Yes    Birth control/protection: None  Other Topics Concern  . Not on file  Social History  Narrative   ** Merged History Encounter **       ** Merged History Encounter **       ** Merged History Encounter **       Social Determinants of Corporate investment banker Strain:   . Difficulty of Paying Living Expenses:   Food Insecurity:   . Worried About Programme researcher, broadcasting/film/video in the Last Year:   . Barista in the Last Year:   Transportation Needs:   . Freight forwarder (Medical):   Marland Kitchen Lack of Transportation (Non-Medical):   Physical Activity:   . Days of Exercise per Week:   . Minutes of Exercise per Session:   Stress:   . Feeling of Stress :   Social Connections:   . Frequency of Communication with Friends and Family:   . Frequency of Social Gatherings with Friends and Family:   . Attends Religious Services:   . Active Member of Clubs or Organizations:   . Attends Banker Meetings:   Marland Kitchen Marital Status:   Intimate Partner Violence:   . Fear of Current or Ex-Partner:   . Emotionally Abused:   Marland Kitchen Physically Abused:   . Sexually Abused:      Physical Exam   Vitals:   04/30/19 0149 04/30/19 2778  BP: 122/79 128/74  Pulse: 99 85  Resp: 17 17  Temp: 97.7 F (36.5 C)   SpO2: 100% 100%    CONSTITUTIONAL: Chronically ill-appearing, NAD NEURO: Somnolent, wakes to voice, moves all extremities EYES:  eyes equal and reactive ENT/NECK:  no LAD, no JVD CARDIO: Regular rate, well-perfused, normal S1 and S2 PULM:  CTAB no wheezing or rhonchi GI/GU:  normal bowel sounds, non-distended, non-tender MSK/SPINE:  No gross deformities, no edema SKIN: Abrasion to the nose and mid forehead PSYCH:  Appropriate speech and behavior  *Additional and/or pertinent findings included in MDM below  Diagnostic and Interventional Summary    EKG Interpretation  Date/Time:    Ventricular Rate:    PR Interval:    QRS Duration:   QT Interval:    QTC Calculation:   R Axis:     Text Interpretation:        Labs Reviewed - No data to display  CT Head Wo  Contrast  Final Result    CT CERVICAL SPINE WO CONTRAST  Final Result      Medications - No data to display   Procedures  /  Critical Care Procedures  ED Course and Medical Decision Making  I have reviewed the triage vital signs, the nursing notes, and pertinent available records from the EMR.  Listed above are laboratory and imaging tests that I personally ordered, reviewed, and interpreted and then considered in my medical decision making (see below for details).      Intoxication with fall and signs of head trauma, CT to exclude intracranial bleeding.  Patient also drunkenly discussing chronic back pain, will need reassessment after further metabolization of alcohol.  No gross neurological deficits at this time.  Patient is now clinically sober and without specific complaints, appropriate for discharge.  Back pain is chronic and he has no neurological deficits or bowel or bladder dysfunction or any other signs of myelopathy.  Barth Kirks. Sedonia Small, MD [place] [email or web address]  Final Clinical Impressions(s) / ED Diagnoses     ICD-10-CM   1. Alcoholic intoxication without complication ([place])  I34.742     ED Discharge Orders    None       Discharge Instructions Discussed with and Provided to Patient:     Discharge Instructions     You were evaluated in the Emergency Department and after careful evaluation, we did not find any emergent condition requiring admission or further testing in the hospital.  Your exam/testing today is overall reassuring.  Please return to the Emergency Department if you experience any worsening of your condition.  We encourage you to follow up with a primary care provider.  Thank you for allowing [place] to be a part of your care.       Maudie Flakes, MD 04/30/19 [phone]

## 2019-05-13 ENCOUNTER — Encounter (HOSPITAL_COMMUNITY): Payer: Self-pay | Admitting: Emergency Medicine

## 2019-05-13 ENCOUNTER — Other Ambulatory Visit: Payer: Self-pay

## 2019-05-13 ENCOUNTER — Emergency Department (HOSPITAL_COMMUNITY)
Admission: EM | Admit: 2019-05-13 | Discharge: 2019-05-13 | Disposition: A | Discharge status: Home / Self Care | Payer: Self-pay | Attending: Emergency Medicine | Admitting: Emergency Medicine

## 2019-05-13 DIAGNOSIS — F10920 Alcohol use, unspecified with intoxication, uncomplicated: Secondary | ICD-10-CM | POA: Insufficient documentation

## 2019-05-13 DIAGNOSIS — Z79899 Other long term (current) drug therapy: Secondary | ICD-10-CM | POA: Insufficient documentation

## 2019-05-13 DIAGNOSIS — F17210 Nicotine dependence, cigarettes, uncomplicated: Secondary | ICD-10-CM | POA: Insufficient documentation

## 2019-05-13 DIAGNOSIS — I10 Essential (primary) hypertension: Secondary | ICD-10-CM | POA: Insufficient documentation

## 2019-05-13 NOTE — ED Notes (Signed)
Patient given water, Ginger Ale, and sandwich

## 2019-05-13 NOTE — ED Provider Notes (Signed)
[facility] DEPT Provider Note   CSN: [id number] Arrival date & time: 05/13/19  1432     History No chief complaint on file.   Rodney Hartman is a 59 y.o. male with PMhx HTN, depression, and alcohol abuse who presents to the ED today via EMS after being found by [employer] laying outside. Per chart review [employer] reported pt appeared to be extremely intoxicated. It was reported that pt drank 1.5 40 ounces of natural ice since noon today however he tells me he has not been drinking anything today; he states he is still intoxicated from last night where he "shared" 3 40 ounce beers with friends. Pt does endorse he is currently homeless and was trying to find some shade today when he was found by [employer]. His only complaint currently is chronic back pain. No SI, HI, or AVH.   The history is provided by the patient and medical records.       Past Medical History:  Diagnosis Date  . Alcohol abuse   . Depression   . Hypertension   . PUD (peptic ulcer disease)     Patient Active Problem List   Diagnosis Date Noted  . Melena   . Non-intractable vomiting   . Acute gastric ulcer with hemorrhage   . Hypokalemia 06/23/2018  . Upper GI bleed 06/23/2018  . Suicidal ideation   . Substance induced mood disorder ([place])   . Fall 10/07/2016  . Subdural hematoma ([place]) 10/07/2016  . Alcohol dependence with unspecified alcohol-induced disorder ([place]) 01/24/2016  . Atypical chest pain   . Acute congestive heart failure ([place]) 08/12/2015  . Anemia 08/12/2015  . HTN (hypertension) 08/12/2015  . Alcohol abuse with intoxication, uncomplicated ([place]) 78/29/5621  . MDD (major depressive disorder), recurrent severe, without psychosis ([place]) 07/31/2015  . Suicidal ideation 07/31/2015  . Homicidal ideation 07/31/2015  . Alcohol abuse with alcohol-induced mood disorder ([place]) 01/25/2015  . Alcohol use disorder, severe, dependence ([place]) 12/17/2014  . Alcohol abuse 08/07/2014    Past Surgical  History:  Procedure Laterality Date  . BACK SURGERY    . BIOPSY  06/24/2018   Procedure: BIOPSY;  Surgeon: Ladene Artist, MD;  Location: [facility];  Service: Endoscopy;;  . ESOPHAGOGASTRODUODENOSCOPY N/A 06/24/2018   Procedure: ESOPHAGOGASTRODUODENOSCOPY (EGD);  Surgeon: Ladene Artist, MD;  Location: [facility];  Service: Endoscopy;  Laterality: N/A;  . 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   . TONSILLECTOMY         Family History  Problem Relation Age of Onset  . Heart disease Mother     Social History   Tobacco Use  . Smoking status: Current Every Day Smoker  . Smokeless tobacco: Never Used  Substance Use Topics  . Alcohol use: Yes    Comment: every other day   . Drug use: No    Home Medications Prior to Admission medications   Medication Sig Start Date End Date Taking? Authorizing Provider  folic acid (FOLVITE) 1 MG tablet Take 1 tablet (1 mg total) by mouth daily. 06/27/18   Dessa Phi, DO  lisinopril (ZESTRIL) 20 MG tablet Take 1 tablet (20 mg total) by mouth daily. 06/26/18   Dessa Phi, DO  naproxen (NAPROSYN) 500 MG tablet Take 1 tablet (500 mg total) by mouth every 12 (twelve) hours as needed for mild pain or moderate pain. 08/21/18   Antonietta Breach, PA-C  pantoprazole (PROTONIX) 40 MG tablet Take 1 tablet (40 mg total) by mouth 2 (two) times daily before a meal. 06/26/18   Noralee Stain, DO  predniSONE (STERAPRED UNI-PAK 21 TAB) 10 MG (21) TBPK tablet Take by mouth daily. Take 6 tabs by mouth daily  for 2 days, then 5 tabs for 2 days, then 4 tabs for 2 days, then 3 tabs for 2 days, 2 tabs for 2 days, then 1 tab by mouth daily for 2 days 04/24/19   Couture, Cortni S, PA-C  thiamine 100 MG tablet Take 1 tablet (100 mg total) by mouth daily. 06/27/18   Noralee Stain, DO    Allergies    Patient has no known allergies.  Review of Systems   Review of Systems  Constitutional:  Negative for chills and fever.  Musculoskeletal: Positive for back pain.  All other systems reviewed and are negative.   Physical Exam Updated Vital Signs BP 132/72 (BP Location: Right Arm)   Pulse 89   Temp 98.1 F (36.7 C) (Oral)   Resp 16   Ht 5\' 11"  (1.803 m)   Wt 83 kg   SpO2 95%   BMI 25.52 kg/m   Physical Exam Vitals and nursing note reviewed.  Constitutional:      Appearance: He is not ill-appearing.     Comments: Appears intoxicated  HENT:     Head: Normocephalic and atraumatic.  Eyes:     Conjunctiva/sclera: Conjunctivae normal.  Cardiovascular:     Rate and Rhythm: Normal rate and regular rhythm.     Pulses: Normal pulses.  Pulmonary:     Effort: Pulmonary effort is normal.     Breath sounds: Normal breath sounds. No wheezing, rhonchi or rales.  Abdominal:     Palpations: Abdomen is soft.     Tenderness: There is no abdominal tenderness.  Musculoskeletal:     Cervical back: Neck supple.  Skin:    General: Skin is warm and dry.  Neurological:     Mental Status: He is alert and oriented to person, place, and time.     ED Results / Procedures / Treatments   Labs (all labs ordered are listed, but only abnormal results are displayed) Labs Reviewed - No data to display  EKG None  Radiology No results found.  Procedures Procedures (including critical care time)  Medications Ordered in ED Medications - No data to display  ED Course  I have reviewed the triage vital signs and the nursing notes.  Pertinent labs & imaging results that were available during my care of the patient were reviewed by me and considered in my medical decision making (see chart for details).    MDM Rules/Calculators/A&P                      59 year old male who presents to the ED today after being found by [employer] laying outside.  Noted to be extremely intoxicated.  He told EMS that he drank 1-1/2 40s earlier today however he told me that he stopped drinking last night.   Patient has no complaints currently.  He does appear intoxicated however he is alert and oriented x3.  Will allow him to sober up here.  He has no complaints currently besides chronic back pain.   Pt sobered up in the ED. He was able to ambulate without assistance. Stable for discharge home.   This note was prepared using Dragon voice recognition software and may include unintentional dictation errors due to  the inherent limitations of voice recognition software.  Final Clinical Impression(s) / ED Diagnoses Final diagnoses:  Alcoholic intoxication without complication [facility])    Rx / DC Orders ED Discharge Orders    None     Discharge Instructions   None       Tanda Rockers, PA-C 05/13/19 2051    Virgina Norfolk, DO 05/13/19 2234

## 2019-05-13 NOTE — ED Notes (Signed)
Patient unable to sign for discharge paperwork as he is in hallway bed. Patient verbalized understanding of discharge.

## 2019-05-13 NOTE — ED Notes (Signed)
Patient ambulated to restroom without need for assistance.

## 2019-05-13 NOTE — ED Triage Notes (Signed)
Patient BIBA, found by [employer] lying outside. [employer] reports patient appeared to be extremely intoxicated. Patient states he has had 1 and 1/2 40's of Natural Ice since noon. Patient complaining of chronic leg pain and back pain.   BP 126/80 HR 90 RR 20 98% RA  CBG 210

## 2019-06-03 ENCOUNTER — Other Ambulatory Visit: Payer: Self-pay

## 2019-06-03 ENCOUNTER — Encounter (HOSPITAL_COMMUNITY): Payer: Self-pay

## 2019-06-03 DIAGNOSIS — F17200 Nicotine dependence, unspecified, uncomplicated: Secondary | ICD-10-CM | POA: Insufficient documentation

## 2019-06-03 DIAGNOSIS — Y929 Unspecified place or not applicable: Secondary | ICD-10-CM | POA: Insufficient documentation

## 2019-06-03 DIAGNOSIS — I10 Essential (primary) hypertension: Secondary | ICD-10-CM | POA: Insufficient documentation

## 2019-06-03 DIAGNOSIS — S0001XA Abrasion of scalp, initial encounter: Secondary | ICD-10-CM | POA: Insufficient documentation

## 2019-06-03 DIAGNOSIS — Z79899 Other long term (current) drug therapy: Secondary | ICD-10-CM | POA: Insufficient documentation

## 2019-06-03 DIAGNOSIS — W010XXA Fall on same level from slipping, tripping and stumbling without subsequent striking against object, initial encounter: Secondary | ICD-10-CM | POA: Insufficient documentation

## 2019-06-03 DIAGNOSIS — Y9389 Activity, other specified: Secondary | ICD-10-CM | POA: Insufficient documentation

## 2019-06-03 DIAGNOSIS — F1010 Alcohol abuse, uncomplicated: Secondary | ICD-10-CM | POA: Insufficient documentation

## 2019-06-03 DIAGNOSIS — Y999 Unspecified external cause status: Secondary | ICD-10-CM | POA: Insufficient documentation
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
## 2019-06-04 ENCOUNTER — Emergency Department (HOSPITAL_COMMUNITY)
Admission: EM | Admit: 2019-06-04 | Discharge: 2019-06-04 | Disposition: A | Discharge status: Home / Self Care | Payer: Self-pay | Attending: Emergency Medicine | Admitting: Emergency Medicine

## 2019-06-04 ENCOUNTER — Emergency Department (HOSPITAL_COMMUNITY): Payer: Self-pay

## 2019-06-04 DIAGNOSIS — F10920 Alcohol use, unspecified with intoxication, uncomplicated: Secondary | ICD-10-CM

## 2019-06-04 DIAGNOSIS — S0001XA Abrasion of scalp, initial encounter: Secondary | ICD-10-CM

## 2019-06-04 NOTE — Discharge Instructions (Addendum)
Get help right away if: °You have a red streak spreading away from your wound. °You have a fever. °You have fluid, blood, or pus coming from your wound. °You notice a bad smell coming from your wound or your dressing. °

## 2019-06-04 NOTE — ED Provider Notes (Signed)
[facility]-EMERGENCY DEPT Provider Note   CSN: [id number] Arrival date & time: 06/03/19  2245     History Chief Complaint  Patient presents with  . Alcohol Intoxication    Rodney Hartman is a 59 y.o. male who presents with head injury.  Patient was intoxicated after drinking a 40 ounce of malt liquor+ last night.  He tripped and fell hitting the back of his head.  He did not lose consciousness.  He has an abrasion to the scalp.  He is up-to-date on his tetanus vaccination.  Patient would like help with alcohol rehabilitation but denies active suicidal or homicidal ideation.  He has a slight headache but no other complaints, no other neurologic abnormalities including changes in vision, ataxia, nausea, vomiting, severe headache, unilateral weakness, paresthesia, upper extremity weakness.  HPI     Past Medical History:  Diagnosis Date  . Alcohol abuse   . Depression   . Hypertension   . PUD (peptic ulcer disease)     Patient Active Problem List   Diagnosis Date Noted  . Melena   . Non-intractable vomiting   . Acute gastric ulcer with hemorrhage   . Hypokalemia 06/23/2018  . Upper GI bleed 06/23/2018  . Suicidal ideation   . Substance induced mood disorder (HCC)   . Fall 10/07/2016  . Subdural hematoma (HCC) 10/07/2016  . Alcohol dependence with unspecified alcohol-induced disorder (HCC) 01/24/2016  . Atypical chest pain   . Acute congestive heart failure (HCC) 08/12/2015  . Anemia 08/12/2015  . HTN (hypertension) 08/12/2015  . Alcohol abuse with intoxication, uncomplicated (HCC) 07/31/2015  . MDD (major depressive disorder), recurrent severe, without psychosis (HCC) 07/31/2015  . Suicidal ideation 07/31/2015  . Homicidal ideation 07/31/2015  . Alcohol abuse with alcohol-induced mood disorder (HCC) 01/25/2015  . Alcohol use disorder, severe, dependence (HCC) 12/17/2014  . Alcohol abuse 08/07/2014    Past Surgical History:  Procedure Laterality  Date  . BACK SURGERY    . BIOPSY  06/24/2018   Procedure: BIOPSY;  Surgeon: Meryl Dare, MD;  Location: [facility];  Service: Endoscopy;;  . ESOPHAGOGASTRODUODENOSCOPY N/A 06/24/2018   Procedure: ESOPHAGOGASTRODUODENOSCOPY (EGD);  Surgeon: Meryl Dare, MD;  Location: [facility];  Service: Endoscopy;  Laterality: N/A;  . 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   . TONSILLECTOMY         Family History  Problem Relation Age of Onset  . Heart disease Mother     Social History   Tobacco Use  . Smoking status: Current Every Day Smoker  . Smokeless tobacco: Never Used  Substance Use Topics  . Alcohol use: Yes    Comment: every other day   . Drug use: No    Home Medications Prior to Admission medications   Medication Sig Start Date End Date Taking? Authorizing Provider  folic acid (FOLVITE) 1 MG tablet Take 1 tablet (1 mg total) by mouth daily. 06/27/18   Noralee Stain, DO  lisinopril (ZESTRIL) 20 MG tablet Take 1 tablet (20 mg total) by mouth daily. 06/26/18   Noralee Stain, DO  naproxen (NAPROSYN) 500 MG tablet Take 1 tablet (500 mg total) by mouth every 12 (twelve) hours as needed for mild pain or moderate pain. 08/21/18   Antony Madura, PA-C  pantoprazole (PROTONIX) 40 MG tablet Take 1 tablet (40 mg total) by mouth 2 (two) times daily before a meal.  06/26/18   Noralee Stain, DO  predniSONE (STERAPRED UNI-PAK 21 TAB) 10 MG (21) TBPK tablet Take by mouth daily. Take 6 tabs by mouth daily  for 2 days, then 5 tabs for 2 days, then 4 tabs for 2 days, then 3 tabs for 2 days, 2 tabs for 2 days, then 1 tab by mouth daily for 2 days 04/24/19   Couture, Cortni S, PA-C  thiamine 100 MG tablet Take 1 tablet (100 mg total) by mouth daily. 06/27/18   Noralee Stain, DO    Allergies    Patient has no known allergies.  Review of Systems   Review of Systems Ten systems reviewed and are negative for acute  change, except as noted in the HPI.   Physical Exam Updated Vital Signs BP 128/80 (BP Location: Right Arm)   Pulse 98   Temp 98.9 F (37.2 C) (Oral)   Resp 17   Ht 5\' 11"  (1.803 m)   Wt 83 kg   SpO2 96%   BMI 25.52 kg/m   Physical Exam Vitals and nursing note reviewed.  Constitutional:      General: He is not in acute distress.    Appearance: He is well-developed. He is not diaphoretic.  HENT:     Head: Normocephalic.     Comments: Small abrasion to the right parietal scalp without evidence of laceration Eyes:     General: No scleral icterus.    Conjunctiva/sclera: Conjunctivae normal.     Pupils: Pupils are equal, round, and reactive to light.     Comments: No horizontal, vertical or rotational nystagmus  Neck:     Comments: Full active and passive ROM without pain No midline or paraspinal tenderness No nuchal rigidity or meningeal signs Cardiovascular:     Rate and Rhythm: Normal rate and regular rhythm.  Pulmonary:     Effort: Pulmonary effort is normal. No respiratory distress.     Breath sounds: Normal breath sounds. No wheezing or rales.  Abdominal:     General: Bowel sounds are normal.     Palpations: Abdomen is soft.     Tenderness: There is no abdominal tenderness. There is no guarding or rebound.  Musculoskeletal:        General: Normal range of motion.     Cervical back: Normal range of motion and neck supple.  Lymphadenopathy:     Cervical: No cervical adenopathy.  Skin:    General: Skin is warm and dry.     Findings: No rash.  Neurological:     Mental Status: He is alert and oriented to person, place, and time.     Cranial Nerves: No cranial nerve deficit.     Motor: No abnormal muscle tone.     Coordination: Coordination normal.     Comments: Mental Status:  Alert, oriented, thought content appropriate. Speech fluent without evidence of aphasia. Able to follow 2 step commands without difficulty.  Cranial Nerves:  II:  Peripheral visual fields  grossly normal, pupils equal, round, reactive to light III,IV, VI: ptosis not present, extra-ocular motions intact bilaterally  V,VII: smile symmetric, facial light touch sensation equal VIII: hearing grossly normal bilaterally  IX,X: midline uvula rise  XI: bilateral shoulder shrug equal and strong XII: midline tongue extension  Motor:  5/5 in upper and lower extremities bilaterally including strong and equal grip strength and dorsiflexion/plantar flexion Sensory: Pinprick and light touch normal in all extremities.  Cerebellar: normal finger-to-nose with bilateral upper extremities Gait: normal gait and balance CV:  distal pulses palpable throughout   Psychiatric:        Behavior: Behavior normal.        Thought Content: Thought content normal.        Judgment: Judgment normal.     Comments: Patient appears clinically sober and ambulatory     ED Results / Procedures / Treatments   Labs (all labs ordered are listed, but only abnormal results are displayed) Labs Reviewed - No data to display  EKG None  Radiology No results found.  Procedures Procedures (including critical care time)  Medications Ordered in ED Medications - No data to display  ED Course  I have reviewed the triage vital signs and the nursing notes.  Pertinent labs & imaging results that were available during my care of the patient were reviewed by me and considered in my medical decision making (see chart for details).    MDM Rules/Calculators/A&P                      59 year old male here with history of alcohol abuse, abrasion to the scalp, tetanus up-to-date.  Wound cleansed and clean dressing applied.  No evidence of laceration needing repair.  He is clinically sober, ambulatory without abnormality with a normal neurologic exam.  I personally ordered interpreted reviewed the images of the head CT which showed no acute intracranial pathology or scalp fracture.  Patient will be discharged with outpatient  alcohol abuse resources.  He may follow-up as needed.  Appears appropriate for discharge.  Discussed wound care and return precautions. Final Clinical Impression(s) / ED Diagnoses Final diagnoses:  None    Rx / DC Orders ED Discharge Orders    None       Margarita Mail, PA-C 06/04/19 [id number]    Sherwood Gambler, MD 06/04/19 1036

## 2019-09-04 ENCOUNTER — Emergency Department (HOSPITAL_COMMUNITY)
Admission: EM | Admit: 2019-09-04 | Discharge: 2019-09-04 | Discharge status: Against Medical Advice | Payer: Self-pay | Attending: Emergency Medicine | Admitting: Emergency Medicine

## 2019-09-04 ENCOUNTER — Other Ambulatory Visit: Payer: Self-pay

## 2019-09-04 DIAGNOSIS — M549 Dorsalgia, unspecified: Secondary | ICD-10-CM | POA: Insufficient documentation

## 2019-09-04 DIAGNOSIS — F1020 Alcohol dependence, uncomplicated: Secondary | ICD-10-CM | POA: Insufficient documentation

## 2019-09-04 NOTE — ED Provider Notes (Signed)
Went to triage room 8 to assess patient; he was not in room. Discussed with RN staff -- appears he has eloped.    Rodney Hartman, [place] 09/04/19 2015    Wynetta Fines, MD 09/04/19 2251

## 2019-09-04 NOTE — ED Triage Notes (Signed)
Ar[place] EMS [place] the side of the road, C/C back pain x3 months and ETOH. Hx of chronic back pain. States no new njuries to back, endorses drinking 2 40s.

## 2020-01-18 ENCOUNTER — Observation Stay (HOSPITAL_COMMUNITY)
Admission: EM | Admit: 2020-01-18 | Discharge: 2020-01-19 | Disposition: A | Discharge status: Home / Self Care | Payer: HRSA Program | Attending: Student in an Organized Health Care Education/Training Program | Admitting: Student in an Organized Health Care Education/Training Program

## 2020-01-18 ENCOUNTER — Other Ambulatory Visit: Payer: Self-pay

## 2020-01-18 ENCOUNTER — Emergency Department (HOSPITAL_COMMUNITY): Payer: HRSA Program

## 2020-01-18 DIAGNOSIS — E872 Acidosis: Secondary | ICD-10-CM | POA: Insufficient documentation

## 2020-01-18 DIAGNOSIS — U071 COVID-19: Principal | ICD-10-CM | POA: Diagnosis present

## 2020-01-18 DIAGNOSIS — F17200 Nicotine dependence, unspecified, uncomplicated: Secondary | ICD-10-CM | POA: Diagnosis not present

## 2020-01-18 DIAGNOSIS — W1830XA Fall on same level, unspecified, initial encounter: Secondary | ICD-10-CM | POA: Diagnosis present

## 2020-01-18 DIAGNOSIS — W1839XA Other fall on same level, initial encounter: Secondary | ICD-10-CM | POA: Insufficient documentation

## 2020-01-18 DIAGNOSIS — I10 Essential (primary) hypertension: Secondary | ICD-10-CM | POA: Diagnosis present

## 2020-01-18 DIAGNOSIS — I160 Hypertensive urgency: Secondary | ICD-10-CM | POA: Insufficient documentation

## 2020-01-18 DIAGNOSIS — Y9301 Activity, walking, marching and hiking: Secondary | ICD-10-CM | POA: Diagnosis not present

## 2020-01-18 DIAGNOSIS — F1020 Alcohol dependence, uncomplicated: Secondary | ICD-10-CM | POA: Diagnosis present

## 2020-01-18 DIAGNOSIS — Z79899 Other long term (current) drug therapy: Secondary | ICD-10-CM | POA: Insufficient documentation

## 2020-01-18 DIAGNOSIS — S0990XA Unspecified injury of head, initial encounter: Secondary | ICD-10-CM | POA: Diagnosis present

## 2020-01-18 DIAGNOSIS — E8729 Other acidosis: Secondary | ICD-10-CM | POA: Diagnosis present

## 2020-01-18 DIAGNOSIS — I440 Atrioventricular block, first degree: Secondary | ICD-10-CM | POA: Insufficient documentation

## 2020-01-18 DIAGNOSIS — R748 Abnormal levels of other serum enzymes: Secondary | ICD-10-CM | POA: Diagnosis present

## 2020-01-18 DIAGNOSIS — S0081XA Abrasion of other part of head, initial encounter: Secondary | ICD-10-CM | POA: Insufficient documentation

## 2020-01-18 DIAGNOSIS — K7010 Alcoholic hepatitis without ascites: Secondary | ICD-10-CM | POA: Diagnosis not present

## 2020-01-18 DIAGNOSIS — R55 Syncope and collapse: Secondary | ICD-10-CM | POA: Diagnosis present

## 2020-01-18 DIAGNOSIS — N2889 Other specified disorders of kidney and ureter: Secondary | ICD-10-CM | POA: Insufficient documentation

## 2020-01-18 LAB — TROPONIN I (HIGH SENSITIVITY)
Troponin I (High Sensitivity): 13 ng/L (ref <18)
Troponin I (High Sensitivity): 23 ng/L — ABNORMAL HIGH (ref <18)
Troponin I (High Sensitivity): 30 ng/L — ABNORMAL HIGH (ref <18)
Troponin I (High Sensitivity): 33 ng/L — ABNORMAL HIGH (ref <18)

## 2020-01-18 LAB — HEPATIC FUNCTION PANEL
ALT: 72 U/L — ABNORMAL HIGH (ref 0–44)
AST: 174 U/L — ABNORMAL HIGH (ref 15–41)
Albumin: 3.1 g/dL — ABNORMAL LOW (ref 3.5–5.0)
Alkaline Phosphatase: 84 U/L (ref 38–126)
Bilirubin, Direct: 0.6 mg/dL — ABNORMAL HIGH (ref 0.0–0.2)
Indirect Bilirubin: 1.3 mg/dL — ABNORMAL HIGH (ref 0.3–0.9)
Total Bilirubin: 1.9 mg/dL — ABNORMAL HIGH (ref 0.3–1.2)
Total Protein: 6 g/dL — ABNORMAL LOW (ref 6.5–8.1)

## 2020-01-18 LAB — CBC
HCT: 41.9 % (ref 39.0–52.0)
Hemoglobin: 14.4 g/dL (ref 13.0–17.0)
MCH: 31.6 pg (ref 26.0–34.0)
MCHC: 34.4 g/dL (ref 30.0–36.0)
MCV: 91.9 fL (ref 80.0–100.0)
Platelets: 237 10*3/uL (ref 150–400)
RBC: 4.56 MIL/uL (ref 4.22–5.81)
RDW: 14.6 % (ref 11.5–15.5)
WBC: 3.4 10*3/uL — ABNORMAL LOW (ref 4.0–10.5)
nRBC: 0 % (ref 0.0–0.2)

## 2020-01-18 LAB — BASIC METABOLIC PANEL
Anion gap: 19 — ABNORMAL HIGH (ref 5–15)
BUN: 11 mg/dL (ref 6–20)
CO2: 19 mmol/L — ABNORMAL LOW (ref 22–32)
Calcium: 9.4 mg/dL (ref 8.9–10.3)
Chloride: 100 mmol/L (ref 98–111)
Creatinine, Ser: 1.07 mg/dL (ref 0.61–1.24)
GFR, Estimated: >60 mL/min (ref >60)
Glucose, Bld: 168 mg/dL — ABNORMAL HIGH (ref 70–99)
Potassium: 3.9 mmol/L (ref 3.5–5.1)
Sodium: 138 mmol/L (ref 135–145)

## 2020-01-18 LAB — DIFFERENTIAL
Abs Immature Granulocytes: 0.01 10*3/uL (ref 0.00–0.07)
Basophils Absolute: 0.1 10*3/uL (ref 0.0–0.1)
Basophils Relative: 2 %
Eosinophils Absolute: 0.1 10*3/uL (ref 0.0–0.5)
Eosinophils Relative: 2 %
Immature Granulocytes: 0 %
Lymphocytes Relative: 12 %
Lymphs Abs: 0.4 10*3/uL — ABNORMAL LOW (ref 0.7–4.0)
Monocytes Absolute: 0.2 10*3/uL (ref 0.1–1.0)
Monocytes Relative: 5 %
Neutro Abs: 2.7 10*3/uL (ref 1.7–7.7)
Neutrophils Relative %: 79 %

## 2020-01-18 LAB — RESP PANEL BY RT-PCR (FLU A&B, COVID) ARPGX2
Influenza A by PCR: NEGATIVE
Influenza B by PCR: NEGATIVE
SARS Coronavirus 2 by RT PCR: POSITIVE — AB

## 2020-01-18 LAB — ETHANOL: Alcohol, Ethyl (B): <10 mg/dL (ref <10)

## 2020-01-18 MED ORDER — FOLIC ACID 1 MG PO TABS
1.0000 mg | ORAL_TABLET | Freq: Every day | ORAL | Status: DC
  Administered 2020-01-18 – 2020-01-19 (×2): 1 mg via ORAL
  Filled 2020-01-18 (×2): qty 1

## 2020-01-18 MED ORDER — POLYETHYLENE GLYCOL 3350 17 G PO PACK: 17.0000 g | PACK | Freq: Every day | ORAL | Status: DC | PRN

## 2020-01-18 MED ORDER — LISINOPRIL 20 MG PO TABS
20.0000 mg | ORAL_TABLET | Freq: Every day | ORAL | Status: DC
  Administered 2020-01-18 – 2020-01-19 (×2): 20 mg via ORAL
  Filled 2020-01-18 (×2): qty 1

## 2020-01-18 MED ORDER — THIAMINE HCL 100 MG PO TABS
100.0000 mg | ORAL_TABLET | Freq: Every day | ORAL | Status: DC
  Administered 2020-01-18 – 2020-01-19 (×2): 100 mg via ORAL
  Filled 2020-01-18 (×2): qty 1

## 2020-01-18 MED ORDER — PANTOPRAZOLE SODIUM 40 MG PO TBEC
40.0000 mg | DELAYED_RELEASE_TABLET | Freq: Two times a day (BID) | ORAL | Status: DC
  Administered 2020-01-19: 40 mg via ORAL
  Filled 2020-01-18 (×2): qty 1

## 2020-01-18 MED ORDER — ENOXAPARIN SODIUM 40 MG/0.4ML ~~LOC~~ SOLN
40.0000 mg | SUBCUTANEOUS | Status: DC
  Administered 2020-01-18: 40 mg via SUBCUTANEOUS
  Filled 2020-01-18: qty 0.4

## 2020-01-18 MED ORDER — SODIUM CHLORIDE 0.9 % IV BOLUS
1000.0000 mL | Freq: Once | INTRAVENOUS | Status: AC
  Administered 2020-01-18: 1000 mL via INTRAVENOUS

## 2020-01-18 NOTE — ED Provider Notes (Signed)
[facility] EMERGENCY DEPARTMENT Provider Note   CSN: [id number] Arrival date & time: 01/18/20  3149     History No chief complaint on file.   Rodney Hartman is a 60 y.o. male.  HPI   Patient with significant medical history of chronic lower back pain, hypertension, alcohol use PUD presents to the emergency department with chief complaint of fall.  Patient states yesterday while he was walking his legs gave out causing him to fall backwards and hit his head.  He is unsure whether or not he lost consciousness, but was able to get himself up off the floor and called EMS for help.  He denies headaches, change in vision, paresthesia or weakness in upper or lower extremities, he is not on anticoagulant.  Patient endorses that he has chronic lower back pain and will have radiating pain down his legs causing his legs to become weak and for him to fall.  Patient denies urinary incontinence, urinary retention, difficulty with bowel movements, he denies saddle paresthesias.  He has no urinary symptoms at this time, he denies headaches, fevers, chills, shortness of breath, chest pain, abdominal pain, nausea, vomiting, diarrhea, pedal edema.  Past Medical History:  Diagnosis Date   Alcohol abuse    Depression    Hypertension    PUD (peptic ulcer disease)     Patient Active Problem List   Diagnosis Date Noted   Melena    Non-intractable vomiting    Acute gastric ulcer with hemorrhage    Hypokalemia 06/23/2018   Upper GI bleed 06/23/2018   Suicidal ideation    Substance induced mood disorder (HCC)    Fall 10/07/2016   Subdural hematoma (HCC) 10/07/2016   Alcohol dependence with unspecified alcohol-induced disorder (HCC) 01/24/2016   Atypical chest pain    Acute congestive heart failure (HCC) 08/12/2015   Anemia 08/12/2015   HTN (hypertension) 08/12/2015   Alcohol abuse with intoxication, uncomplicated (HCC) 07/31/2015   MDD (major depressive  disorder), recurrent severe, without psychosis (HCC) 07/31/2015   Suicidal ideation 07/31/2015   Homicidal ideation 07/31/2015   Alcohol abuse with alcohol-induced mood disorder (HCC) 01/25/2015   Alcohol use disorder, severe, dependence (HCC) 12/17/2014   Alcohol abuse 08/07/2014    Past Surgical History:  Procedure Laterality Date   BACK SURGERY     BIOPSY  06/24/2018   Procedure: BIOPSY;  Surgeon: Meryl Dare, MD;  Location: [facility];  Service: Endoscopy;;   ESOPHAGOGASTRODUODENOSCOPY N/A 06/24/2018   Procedure: ESOPHAGOGASTRODUODENOSCOPY (EGD);  Surgeon: Meryl Dare, MD;  Location: [facility];  Service: Endoscopy;  Laterality: N/A;   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    TONSILLECTOMY         Family History  Problem Relation Age of Onset   Heart disease Mother     Social History   Tobacco Use   Smoking status: Current Every Day Smoker   Smokeless tobacco: Never Used  Substance Use Topics   Alcohol use: Yes    Comment: every other day    Drug use: No    Home Medications Prior to Admission medications   Medication Sig Start Date End Date Taking? Authorizing Provider  folic acid (FOLVITE) 1 MG tablet Take 1 tablet (1 mg total) by mouth daily. 06/27/18   Noralee Stain, DO  lisinopril (ZESTRIL) 20 MG tablet Take 1 tablet (20 mg total) by mouth daily. 06/26/18  Noralee Stain, DO  naproxen (NAPROSYN) 500 MG tablet Take 1 tablet (500 mg total) by mouth every 12 (twelve) hours as needed for mild pain or moderate pain. 08/21/18   Antony Madura, PA-C  pantoprazole (PROTONIX) 40 MG tablet Take 1 tablet (40 mg total) by mouth 2 (two) times daily before a meal. 06/26/18   Noralee Stain, DO  predniSONE (STERAPRED UNI-PAK 21 TAB) 10 MG (21) TBPK tablet Take by mouth daily. Take 6 tabs by mouth daily  for 2 days, then 5 tabs for 2 days, then 4 tabs for 2 days, then 3 tabs  for 2 days, 2 tabs for 2 days, then 1 tab by mouth daily for 2 days 04/24/19   Couture, Cortni S, PA-C  thiamine 100 MG tablet Take 1 tablet (100 mg total) by mouth daily. 06/27/18   Noralee Stain, DO    Allergies    Patient has no known allergies.  Review of Systems   Review of Systems  Constitutional: Negative for chills and fever.  HENT: Negative for congestion.   Respiratory: Negative for shortness of breath.   Cardiovascular: Negative for chest pain.  Gastrointestinal: Negative for abdominal pain.  Genitourinary: Negative for enuresis.  Musculoskeletal: Positive for back pain.  Skin: Negative for rash.  Neurological: Negative for dizziness and headaches.  Hematological: Does not bruise/bleed easily.    Physical Exam Updated Vital Signs BP (!) 161/91    Pulse 80    Temp 99.3 F (37.4 C) (Oral)    Resp 16    SpO2 100%   Physical Exam Vitals and nursing note reviewed.  Constitutional:      General: He is not in acute distress.    Appearance: He is not ill-appearing.  HENT:     Head: Normocephalic and atraumatic.     Comments: Patient has a known small abrasion is occipital lobe no other gross abnormalities noted.    Nose: No congestion.  Eyes:     Extraocular Movements: Extraocular movements intact.     Conjunctiva/sclera: Conjunctivae normal.  Cardiovascular:     Rate and Rhythm: Normal rate and regular rhythm.     Pulses: Normal pulses.     Heart sounds: No murmur heard. No friction rub. No gallop.   Pulmonary:     Effort: No respiratory distress.     Breath sounds: No wheezing, rhonchi or rales.  Abdominal:     Palpations: Abdomen is soft.     Tenderness: There is no abdominal tenderness.  Musculoskeletal:     Right lower leg: No edema.     Left lower leg: No edema.     Comments: Patient spine was palpated it was nontender to palpation, no step-off or deformities present.  Patient does have noted paraspinal tenderness in his lower lumbar region.  He had a  positive straight leg test on the right side.  Patient is moving all 4 extremities out difficulty.  Skin:    General: Skin is warm and dry.  Neurological:     General: No focal deficit present.     Mental Status: He is alert.  Psychiatric:        Mood and Affect: Mood normal.     ED Results / Procedures / Treatments   Labs (all labs ordered are listed, but only abnormal results are displayed) Labs Reviewed  BASIC METABOLIC PANEL - Abnormal; Notable for the following components:      Result Value   CO2 19 (*)    Glucose, Bld 168 (*)  Anion gap 19 (*)    All other components within normal limits  CBC - Abnormal; Notable for the following components:   WBC 3.4 (*)    All other components within normal limits  HEPATIC FUNCTION PANEL - Abnormal; Notable for the following components:   Total Protein 6.0 (*)    Albumin 3.1 (*)    AST 174 (*)    ALT 72 (*)    Total Bilirubin 1.9 (*)    Bilirubin, Direct 0.6 (*)    Indirect Bilirubin 1.3 (*)    All other components within normal limits  TROPONIN I (HIGH SENSITIVITY) - Abnormal; Notable for the following components:   Troponin I (High Sensitivity) 23 (*)    All other components within normal limits  TROPONIN I (HIGH SENSITIVITY) - Abnormal; Notable for the following components:   Troponin I (High Sensitivity) 30 (*)    All other components within normal limits  RESP PANEL BY RT-PCR (FLU A&B, COVID) ARPGX2  ETHANOL  TROPONIN I (HIGH SENSITIVITY)  TROPONIN I (HIGH SENSITIVITY)  TROPONIN I (HIGH SENSITIVITY)    EKG EKG Interpretation  Date/Time:  Monday January 18 2020 04:30:44 EST Ventricular Rate:  93 PR Interval:  224 QRS Duration: 82 QT Interval:  350 QTC Calculation: 435 R Axis:   3 Text Interpretation: Sinus rhythm with 1st degree A-V block Possible Left atrial enlargement Septal infarct , age undetermined Abnormal ECG When compared with ECG of 06/24/2018, No significant change was found Confirmed by Dione BoozeGlick, David  ([id number]) on 01/18/2020 5:08:54 AM   Radiology CT Head Wo Contrast  Result Date: 01/18/2020 CLINICAL DATA:  Fall yesterday.  Headache. EXAM: CT HEAD WITHOUT CONTRAST CT CERVICAL SPINE WITHOUT CONTRAST TECHNIQUE: Multidetector CT imaging of the head and cervical spine was performed following the standard protocol without intravenous contrast. Multiplanar CT image reconstructions of the cervical spine were also generated. COMPARISON:  06/04/2019 head CT and 04/30/2019 cervical spine CT FINDINGS: CT HEAD FINDINGS Brain: No evidence of acute infarction, hemorrhage, hydrocephalus, extra-axial collection or mass lesion/mass effect. Generalized cerebral volume loss. Vascular: No hyperdense vessel. Skull: Negative for acute fracture. Remote bilateral nasal bone fracture. Sinuses/Orbits: No visible injury. CT CERVICAL SPINE FINDINGS Alignment: No traumatic malalignment. Skull base and vertebrae: C3-C5 solid arthrodesis with posterior cerclage wires. No acute fracture Soft tissues and spinal canal: No prevertebral fluid or swelling. No visible canal hematoma. Disc levels: C6-7 C7-T1 disc degeneration with ridging. Severe C7-T1 facet osteoarthritis with subchondral geode formation. Upper chest: No visible injury IMPRESSION: No evidence of acute intracranial or cervical spine injury. Electronically Signed   By: Marnee SpringJonathon  Watts M.D.   On: 01/18/2020 05:32   CT Cervical Spine Wo Contrast  Result Date: 01/18/2020 CLINICAL DATA:  Fall yesterday.  Headache. EXAM: CT HEAD WITHOUT CONTRAST CT CERVICAL SPINE WITHOUT CONTRAST TECHNIQUE: Multidetector CT imaging of the head and cervical spine was performed following the standard protocol without intravenous contrast. Multiplanar CT image reconstructions of the cervical spine were also generated. COMPARISON:  06/04/2019 head CT and 04/30/2019 cervical spine CT FINDINGS: CT HEAD FINDINGS Brain: No evidence of acute infarction, hemorrhage, hydrocephalus, extra-axial collection or  mass lesion/mass effect. Generalized cerebral volume loss. Vascular: No hyperdense vessel. Skull: Negative for acute fracture. Remote bilateral nasal bone fracture. Sinuses/Orbits: No visible injury. CT CERVICAL SPINE FINDINGS Alignment: No traumatic malalignment. Skull base and vertebrae: C3-C5 solid arthrodesis with posterior cerclage wires. No acute fracture Soft tissues and spinal canal: No prevertebral fluid or swelling. No visible canal hematoma. Disc levels:  C6-7 C7-T1 disc degeneration with ridging. Severe C7-T1 facet osteoarthritis with subchondral geode formation. Upper chest: No visible injury IMPRESSION: No evidence of acute intracranial or cervical spine injury. Electronically Signed   By: Marnee SpringJonathon  Watts M.D.   On: 01/18/2020 05:32   DG Hand Complete Right  Result Date: 01/18/2020 CLINICAL DATA:  Fall and possible finger fracture. EXAM: RIGHT HAND - COMPLETE 3+ VIEW COMPARISON:  01/22/2016 FINDINGS: Advanced ring finger PIP joint osteoarthritis with chronic lateral subluxation. First MCP osteoarthritis is also advanced, with subluxation. Widened scapholunate interval and anterior tilting of the lunate on a chronic basis, consistent with ligamentous insufficiency. No acute fracture or dislocation. IMPRESSION: 1. No acute finding. 2. 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 3. Carpal malalignment from remote ligamentous injury. Electronically Signed   By: Marnee SpringJonathon  Watts M.D.   On: 01/18/2020 05:00    Procedures Procedures (including critical care time)  Medications Ordered in ED Medications  sodium chloride 0.9 % bolus 1,000 mL (0 mLs Intravenous Stopped 01/18/20 1530)    ED Course  I have reviewed the triage vital signs and the nursing notes.  Pertinent labs & imaging results that were available during my care of the patient were reviewed by me and considered in my medical decision making (see chart for details).    MDM Rules/Calculators/A&P                           Patient presents after a fall.  He is alert, does not appear in acute distress, vital signs reassuring.  Will obtain basic lab work-up, imaging of head, C-spine and right hand for further evaluation.  CBC shows leukocytopenia of 3.4, no signs of anemia, BMP shows no electrolyte abnormalities, shows decrease CO2 of 19, increased glucose of 168, increased ion gap of 19.  Initial troponin is 13 second troponin is 23.  will continue to trend.  EKG shows sinus rhythm with first-degree AV block, no signs of ST elevation or signs of ischemia, this appears to be unchanged from previous EKGs.  CT head and C-spine does not reveal any acute findings.  X-ray of hand does not reveal any acute findings.  Patient has continued increasing troponins initial was 13, second was 23, third is 30.  Patient continued to have no chest pain or shortness of breath at this time.  Patient does have elevated blood pressure possible he may be suffering from hypertensive urgency causing this elevation troponin.  We will consult with hospitalist team for further recommendations. Spoke with Dr Cyndie Chimenguyen of the hospitalist team, he has agreed to accept the patient.  I have low suspicion for ACS as history is atypical, patient has no cardiac history, EKG was sinus rhythm without signs of ischemia, patient does have noted uptrending troponin from 13>23.  Heart score is 2.  Possible this is from hypertensive urgency.  Low suspicion for PE as patient denies pleuritic chest pain, shortness of breath, patient denies leg pain, no pedal edema noted on exam, vital signs reassuring. low suspicion for AAA or aortic dissection as history is atypical, patient has low risk factors.  Patient does have noted elevated liver enzymes and a 2-1 fashion possible cause chronic alcohol use, low suspicion for gallbladder abnormality or liver abnormality as patient has no right upper quadrant tenderness, no jaundice noted my exam.  I have low suspicion for spinal  fracture or spinal cord abnormality as patient denies urinary incontinency, retention, difficulty with bowel movements, denies  saddle paresthesias.  Spine was palpated there is no step-off, crepitus or gross deformities felt, patient had full range of motion, neurovascular fully intact in the lower extremities.  Suspect patient will need further observation to monitor uptrending troponins.  Patient care be transferred to admitting team.   Final Clinical Impression(s) / ED Diagnoses Final diagnoses:  Hypertensive urgency    Rx / DC Orders ED Discharge Orders    None       Carroll Sage, PA-C 01/18/20 2032    Alvira Monday, MD 01/19/20 1515

## 2020-01-18 NOTE — ED Triage Notes (Signed)
Pt here from a fall yesterday and today , unknown loc pt with c/o h/a

## 2020-01-18 NOTE — ED Notes (Signed)
Dinner tray ordered.

## 2020-01-18 NOTE — H&P (Addendum)
Date: 01/19/2020               Patient Name:  Rodney Hartman MRN: [id number]  DOB: 08-16-1960 Age / Sex: 60 y.o., male   PCP: Hilbert Corrigan Chales Abrahams, NP         Medical Service: Internal Medicine Teaching Service         Attending Physician: Dr. Oswaldo Done, Marquita Palms, *    First Contact: Dr. Tonye Royalty  Pager: [phone]  Second Contact: Dr. Lerry Liner Pager: [phone]       After Hours (After 5p/  First Contact Pager: [phone]  weekends / holidays): Second Contact Pager: [phone]   Chief Complaint: Fall  History of Present Illness:   60 year old male with past medical history of hypertension, alcohol intoxication with DT, depression, chronic lower back pain, who presented to the ED after a fall.  Patient states that he was walking up stairs, then had a shooting pain from his lower back to his right leg which made his legs give out causing him to fall backward and hit his head.  He braced himself with left elbow and right hand.  Patient reports loss of consciousness.  His friends found him on the floor and called 911.  States that he felt confused when waking up but quickly back to baseline.  Patient first denies any prodrome before his fall.  But then reports having palpitation with dizziness and vertigo before his fall.  Denies chest pain, shortness of breath or any focal neurological deficits.  Denies any seizure activities.  States that his last alcoholic drink was last Friday.  Patient reports having chronic low back pain for more than 1 year with occasional shooting pain to the back of his right leg.  Pain is worse with lifting heavy objects.  Patient is not taking any medication for it. Also complains of intermittent calf cramps.   He has not been taking his blood pressure medication for more than 2 weeks.  States that he received his medication from the "[facility] building."  In the ED, CT head and CT cervical were negative for fracture.  Right hand x-ray also negative for fracture.  CBC  unremarkable.  BMP show metabolic anion gap with bicarb 19 and AG of 19.  Creatinine normal.  Abnormal LFT with AST of 174, ALT 72 with total bili 1.9.  Troponin 13-23-30.  EKG shows sinus rhythm with first-degree AV block.  Meds:  Current Meds  Medication Sig   cyclobenzaprine (FLEXERIL) 10 MG tablet Take 10 mg by mouth at bedtime as needed for muscle spasms.   lisinopril (ZESTRIL) 40 MG tablet Take 40 mg by mouth daily.   omeprazole (PRILOSEC) 20 MG capsule Take 20 mg by mouth daily.   traZODone (DESYREL) 150 MG tablet Take 150 mg by mouth at bedtime.   [DISCONTINUED] lisinopril (ZESTRIL) 20 MG tablet Take 1 tablet (20 mg total) by mouth daily.   [DISCONTINUED] pantoprazole (PROTONIX) 40 MG tablet Take 1 tablet (40 mg total) by mouth 2 (two) times daily before a meal.    Allergies: Allergies as of 01/18/2020   (No Known Allergies)   Past Medical History:  Diagnosis Date   Alcohol abuse    Depression    Hypertension    PUD (peptic ulcer disease)     Family History:  Father and grandfather have heart disease and diabetes  Social History:  Smoke about 5 cigarettes a day, on and off Drinks 2 24 ounces of beer a  day, last drink was 4 days ago Denies recreational drug use  Review of Systems: A complete ROS was negative except as per HPI.   Physical Exam: Blood pressure 128/78, pulse 77, temperature 98.5 F (36.9 C), temperature source Oral, resp. rate 15, SpO2 100 %.  Physical Exam Constitutional:      General: He is not in acute distress. HENT:     Head: Normocephalic and atraumatic.     Mouth/Throat:     Comments: Missing teeth Eyes:     General:        Right eye: No discharge.        Left eye: No discharge.  Cardiovascular:     Rate and Rhythm: Normal rate and regular rhythm.  Pulmonary:     Effort: Pulmonary effort is normal. No respiratory distress.     Breath sounds: Normal breath sounds. No wheezing.  Abdominal:     General: Bowel sounds are normal. There  is no distension.     Tenderness: There is no abdominal tenderness.  Musculoskeletal:     Cervical back: Normal range of motion. Tenderness (Tenderness to palpation of lower cervical bilaterally) present.     Right lower leg: No edema.     Left lower leg: No edema.     Comments: Tenderness to palpation of lower cervical region Normal ROM of all extremity Tenderness to palpation of lower lumbar. No step off sign. Able to sit up from lying position without help.   Skin:    General: Skin is warm.  Neurological:     General: No focal deficit present.     Mental Status: He is alert.     Comments: PERLLA Cranial nerves no deficit Normal strength and sensation of all extremities  Psychiatric:        Mood and Affect: Mood normal.     EKG: personally reviewed my interpretation is sinus rhythm with first degree AV block   CT head and cervical: no acute injury  X ray right hand: no acute fracture  Assessment & Plan by Problem: Principal Problem:   Syncope Active Problems:   HTN (hypertension)   Metabolic acidosis, increased anion gap   Elevated liver enzymes   SARS-CoV-2 positive  60 year old male with past medical history of hypertension, alcohol intoxication with DT, depression, chronic lower back pain, who presented to the ED after a fall, currently being worked up for presyncopal symptoms leading to a fall and LOC with echocardiogram and monitor telemetry.   Presyncopal symptoms Fall and syncope Patient had a syncopal episode after a fall. His story sounds like a mechanical fall in which his back pain causes his legs to give out which caused the fall.  However, patient also reports prodrome with palpitation, dizziness and vertigo. Will work this up as a syncope. Obtain echocardiogram and monitor telemetry  Patient denies drinking alcohol with drug use yesterday. -F/u Echocardiogram  -Monitor Telemetry. Patient has first degree heart block on EKG but doubt it can cause syncope.   -Check UDS -Seizure precaution -Monitor troponin trend   Hypertension Initial BP elevated 162/115. Likely due to pain 2/2 from trauma and not taking his medications for more than 2 weeks. Will resume home meds. Current BP 125/80. -Resume Lisinopril 20 mg    Metabolic acidosis with anion gap Bicarb 19 and AG 19.  Differentials include methanol/ethanol ingestion or alcohol ketoacidosis.   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 Patient has history of alcohol use disorder. Will obtain hepatitis panel to rule on infectious causes. -Pending hepatitis panel and HIV -CMP in AM   History of alcohol use disorder  Per chart review, patient was seen in the ED many times in the last few years for alcohol intoxication.  States that his drinks 2 24 ounce cans a day.  Last drink was 4 days ago.  No signs of alcohol withdrawal on exam. -CIWA without Ativan -Pending UDS -Thiamine and folic acid   Positive SAR-CoV-2 test Incidental finding.  Patient does not have any respiratory symptoms.  Breathing well on room air. -No treatment indicated   Right kidney mass CT abdomen/pelvis in 2018 showed a incidental finding of a small enhancing mass in the lower pole of the right kidney.  Presumed renal cell carcinoma until proven otherwise.  Patient has not had any follow-up MRI. -Outpatient follow-up with MRI imaging   Code: full DVT: Lovenox  Dispo: Admit patient to Observation with expected length of stay less than 2 midnights.  Signed: Doran Stabler, DO 01/19/2020, 7:22 AM  Pager: [phone] After 5pm on weekdays and 1pm on weekends: On Call pager: [phone]

## 2020-01-19 ENCOUNTER — Observation Stay (HOSPITAL_BASED_OUTPATIENT_CLINIC_OR_DEPARTMENT_OTHER): Payer: HRSA Program

## 2020-01-19 ENCOUNTER — Other Ambulatory Visit (HOSPITAL_COMMUNITY): Payer: Self-pay

## 2020-01-19 DIAGNOSIS — E872 Acidosis: Secondary | ICD-10-CM

## 2020-01-19 DIAGNOSIS — I10 Essential (primary) hypertension: Secondary | ICD-10-CM

## 2020-01-19 DIAGNOSIS — R55 Syncope and collapse: Secondary | ICD-10-CM | POA: Diagnosis not present

## 2020-01-19 DIAGNOSIS — U071 COVID-19: Secondary | ICD-10-CM

## 2020-01-19 DIAGNOSIS — F1020 Alcohol dependence, uncomplicated: Secondary | ICD-10-CM | POA: Diagnosis not present

## 2020-01-19 LAB — HEMOGLOBIN A1C
Hgb A1c MFr Bld: 5 % (ref 4.8–5.6)
Mean Plasma Glucose: 96.8 mg/dL

## 2020-01-19 LAB — CBC
HCT: 41.3 % (ref 39.0–52.0)
Hemoglobin: 14.3 g/dL (ref 13.0–17.0)
MCH: 31.6 pg (ref 26.0–34.0)
MCHC: 34.6 g/dL (ref 30.0–36.0)
MCV: 91.2 fL (ref 80.0–100.0)
Platelets: 216 10*3/uL (ref 150–400)
RBC: 4.53 MIL/uL (ref 4.22–5.81)
RDW: 14.5 % (ref 11.5–15.5)
WBC: 3.4 10*3/uL — ABNORMAL LOW (ref 4.0–10.5)
nRBC: 0 % (ref 0.0–0.2)

## 2020-01-19 LAB — COMPREHENSIVE METABOLIC PANEL
ALT: 76 U/L — ABNORMAL HIGH (ref 0–44)
AST: 156 U/L — ABNORMAL HIGH (ref 15–41)
Albumin: 3.6 g/dL (ref 3.5–5.0)
Alkaline Phosphatase: 97 U/L (ref 38–126)
Anion gap: 12 (ref 5–15)
BUN: 11 mg/dL (ref 6–20)
CO2: 24 mmol/L (ref 22–32)
Calcium: 9.3 mg/dL (ref 8.9–10.3)
Chloride: 99 mmol/L (ref 98–111)
Creatinine, Ser: 0.91 mg/dL (ref 0.61–1.24)
GFR, Estimated: >60 mL/min (ref >60)
Glucose, Bld: 99 mg/dL (ref 70–99)
Potassium: 3.5 mmol/L (ref 3.5–5.1)
Sodium: 135 mmol/L (ref 135–145)
Total Bilirubin: 1 mg/dL (ref 0.3–1.2)
Total Protein: 7.1 g/dL (ref 6.5–8.1)

## 2020-01-19 LAB — ECHOCARDIOGRAM LIMITED
Area-P 1/2: 3.53 cm2
S' Lateral: 3.6 cm

## 2020-01-19 LAB — HIV ANTIBODY (ROUTINE TESTING W REFLEX): HIV Screen 4th Generation wRfx: NONREACTIVE

## 2020-01-19 LAB — VOLATILES,BLD-ACETONE,ETHANOL,ISOPROP,METHANOL
Acetone, blood: <0.01 g/dL (ref 0.000–0.010)
Ethanol, blood: <0.01 g/dL (ref 0.000–0.010)
Isopropanol, blood: <0.01 g/dL (ref 0.000–0.010)
Methanol, blood: <0.01 g/dL (ref 0.000–0.010)

## 2020-01-19 LAB — HEPATITIS A ANTIBODY, TOTAL: hep A Total Ab: NONREACTIVE

## 2020-01-19 LAB — TROPONIN I (HIGH SENSITIVITY): Troponin I (High Sensitivity): 36 ng/L — ABNORMAL HIGH (ref <18)

## 2020-01-19 LAB — HEPATITIS B SURFACE ANTIGEN: Hepatitis B Surface Ag: NONREACTIVE

## 2020-01-19 LAB — HEPATITIS B CORE ANTIBODY, TOTAL: Hep B Core Total Ab: NONREACTIVE

## 2020-01-19 LAB — HEPATITIS C ANTIBODY: HCV Ab: NONREACTIVE

## 2020-01-19 NOTE — ED Notes (Signed)
Tele  Breakfast Ordered 

## 2020-01-19 NOTE — ED Notes (Signed)
Echo being conducted 

## 2020-01-19 NOTE — ED Notes (Signed)
Manuel BP 82/39 manuel. Dinamap 82/13 Attending paged

## 2020-01-19 NOTE — Progress Notes (Signed)
Subjective:   Patient with no acute complaints this morning.  Reports continued soreness to his back where he fell.  Denies chest pain, difficulty breathing, confusion.  Agreeable to discharge later today if remainder of work-up is reassuring.  Objective:  Vital signs in last 24 hours: Vitals:   01/18/20 2215 01/18/20 2252 01/19/20 0230 01/19/20 0615  BP: 125/80  131/90 128/78  Pulse: 87  79 77  Resp: 19  17 15   Temp:  98.1 F (36.7 C) 97.7 F (36.5 C) 98.5 F (36.9 C)  TempSrc:  Oral Oral Oral  SpO2: 100%  100% 100%    Physical Exam Constitutional: no acute distress Head: atraumatic ENT: external ears normal Eyes: EOMI Cardiovascular: regular rate and rhythm, normal heart sounds Pulmonary: effort normal, lungs clear to ascultation bilaterally Abdominal: flat, nontender Skin: warm and dry Neurological: alert, no focal deficit Psychiatric: normal mood and affect  Assessment/Plan: Rodney Hartman is a 60 y.o. male with hx of alcohol use disorder, chronic low back pain, hypertension presenting with syncope with head injury.  Work-up significant for anion gap metabolic acidosis.  Otherwise reassuring with normal CT of head and neck.  Some of work-up still pending.  Principal Problem:   Syncope Active Problems:   HTN (hypertension)   Metabolic acidosis, increased anion gap   Elevated liver enzymes   COVID-19  Fall due to pre-syncopal symptoms Patient had back while walking up stairs, causing his legs to give out. He fell backwards and struck his head. He also reported palpitations, dizziness, and vertigo prior to falling so we started syncope workup. Troponins negative. CT of head and neck negative for intracranial process or fracture.   - f/u echo - monitor elemetry. EKG significant for 1st degree heart block which is not explanatory - f/u UDS - seizure precautions - addressing metabolic acidosis and HTN as below  Alcohol use disorder with history of DTs Elevated  liver enzymes States his last drink was Friday. AST/ALT ratio of 174/72 on admission which suggests ethanol as etiology.  Discussed his alcohol use and encouraged cutting back. - Hepatitis and HIV negative except for surface antibody which is pending - f/u with PCP for liver workup - daily CMP - CIWA without ativan - continue folic acid and thiamine  Metabolic acidosis with anion gap - resolved Now resolved, Bicab of 19 -> 24 and AG of 19 -> 12. DDx include methanol/ethanol ingestion or alcoholic ketoacidosis. - f/u blood volatiles (acetone, ethanol, isopropryl, methanol)  Essential HTN BP elevated to 181/95 on admission, now normotensive after restarting home lisinopril 20mg .  His PCP is Friday with [facility].  He has medications at home, but has not taken his blood pressure meds in some time, notes that his trazodone makes him sleepy, no other complaints. - continue home lisinopril 20mg .  Counseled on continued use at home.  Positive SARS-CoV2 test No current symptoms, but tested COVID-19 positive.  Had Lavinia Sharps vaccine.  He had a sore throat about 1.5 weeks ago which has resolved.  Saturating well on room air.  Unlikely to have an active infection. -no treatment at this time  Diet:  Heart healthy IVF:  none VTE:  lovenox Prior to Admission Living Arrangement:  home Anticipated Discharge Location:  home Barriers to Discharge:  Medical workup Dispo: Anticipated discharge in approximately 0-1 day(s).   [facility], MD 01/19/2020, 6:52 AM Pager: [phone] After 5pm on weekdays and 1pm on weekends:  On Call pager [phone]

## 2020-01-19 NOTE — ED Notes (Signed)
All medications scanned and computer went down before hitting accept, all meds good

## 2020-01-19 NOTE — ED Notes (Addendum)
Went to check on Pt and Pt was not in room. Had given Pt deoderant, toothbrush and allowed him to use covid bathroom  Approximately 1430's ot later. There has been a sitter at the room directly next to Pt's room within direct view of Pt. Was never notified that Pt left out back or front door

## 2020-01-19 NOTE — ED Notes (Addendum)
Spoke to sitter on other side of purple. He ststaed that he thought he saw the Pt go to bathroomm 10 sometime after 1500 but did not know where he went from there. I did witness time Pt went back to room but have beenin room 58 with criticalk pt for a while.  Pt had been compliaining about it taking so long to be released

## 2020-01-19 NOTE — Discharge Summary (Signed)
Patient left hospital prior to obtaining results of TTE.    Name: Rodney Hartman MRN: [id number] DOB: 07-10-1960 60 y.o. PCP: Lavinia Sharps, NP  Date of Admission: 01/18/2020  4:26 AM Date of Discharge:  01/19/2020 Attending Physician: Tyson Alias, *   Discharge Diagnosis: 1. Head trauma without fracture or intracranial process 2. Alcoholic ketoacidosis 3. Alcoholic hepatitis 4. Hypertension 5. Positive SARS-CoV-2 test  Discharge Medications: Allergies as of 01/19/2020   No Known Allergies     Medication List    ASK your doctor about these medications   cyclobenzaprine 10 MG tablet Commonly known as: FLEXERIL Take 10 mg by mouth at bedtime as needed for muscle spasms.   lisinopril 40 MG tablet Commonly known as: ZESTRIL Take 40 mg by mouth daily. Ask about: Which instructions should I use?   naproxen 500 MG tablet Commonly known as: NAPROSYN Take 1 tablet (500 mg total) by mouth every 12 (twelve) hours as needed for mild pain or moderate pain.   omeprazole 20 MG capsule Commonly known as: PRILOSEC Take 20 mg by mouth daily.   traZODone 150 MG tablet Commonly known as: DESYREL Take 150 mg by mouth at bedtime.       Disposition and follow-up:   Rodney Hartman was discharged from [facility] in Good condition.  At the hospital follow up visit please address:  1.  Follow up: . Alcohol use disorder with hepatitis- AST/ALT pattern consistent with alcoholic hepatitis, consider further work-up, counseled on alcohol cessation . Hypertension- restarted home lisinopril 20 mg with good response, adjust as needed  2.  Labs / imaging needed at time of follow-up: Liver function tests, consider right upper quadrant ultrasound  3.  Pending labs/ test needing follow-up: Hepatitis B surface Ab  Follow-up Appointments:   Hospital Course by problem list:  Fall with head injury due to syncope versus mechanical fall Hypertension Patient states  that he was walking up stairs when his chronic back pain flared up and caused his legs to give out. He fell backwards and struck his head. He reported presyncopal symptoms of palpitations, dizziness, and vertigo. Work-up significant for mild anion gap metabolic acidosis, which is thought to be due to alcoholic ketoacidosis. This resolved overnight with hydration. Volatile blood compounds test was negative (isopropanol, methanol, ethanol). Hypertension may have also contributed. Presented with blood pressure of 181/95 which improved into normal range after we restarted his home lisinopril 20 mg. Endorses not taking this in some time without giving [place] a clear reason, though he notes that the trazodone makes him drowsy. Counseled on importance of blood pressure control and following up with his PCP. CT of head and neck was negative for acute intracranial process or neck fracture. Troponins negative x2. TTE demonstrated LVEF of 50-55%, grade 1 diastolic dysfunction, and no valvular disease.  Alcohol use disorder Alcoholic hepatitis AST/ALT ratio 174/72, suggesting alcoholic etiology. Bilirubin of 1.9 initially which resolved overnight after hydration. Viral hepatitis panel is negative so far, surface Ab is still pending. Consider further work-up outpatient.  Positive SARS-CoV-2 test Patient notes sore throat 1.5 weeks ago which has resolved. No current respiratory symptoms and no abnormalities on pulmonary exam. Had [employer] vaccine. Likely reflects previous infection, and is out of quarantine priod.  Discharge Vitals:   BP 127/86 (BP Location: Left Arm)   Pulse 84   Temp 98.5 F (36.9 C) (Oral)   Resp 18   SpO2 99%   Pertinent Labs, Studies, and  Procedures:  CT Head Wo Contrast  Result Date: 01/18/2020 CLINICAL DATA:  Fall yesterday.  Headache. EXAM: CT HEAD WITHOUT CONTRAST CT CERVICAL SPINE WITHOUT CONTRAST TECHNIQUE: Multidetector CT imaging of the head and cervical spine was performed  following the standard protocol without intravenous contrast. Multiplanar CT image reconstructions of the cervical spine were also generated. COMPARISON:  06/04/2019 head CT and 04/30/2019 cervical spine CT FINDINGS: CT HEAD FINDINGS Brain: No evidence of acute infarction, hemorrhage, hydrocephalus, extra-axial collection or mass lesion/mass effect. Generalized cerebral volume loss. Vascular: No hyperdense vessel. Skull: Negative for acute fracture. Remote bilateral nasal bone fracture. Sinuses/Orbits: No visible injury. CT CERVICAL SPINE FINDINGS Alignment: No traumatic malalignment. Skull base and vertebrae: C3-C5 solid arthrodesis with posterior cerclage wires. No acute fracture Soft tissues and spinal canal: No prevertebral fluid or swelling. No visible canal hematoma. Disc levels: C6-7 C7-T1 disc degeneration with ridging. Severe C7-T1 facet osteoarthritis with subchondral geode formation. Upper chest: No visible injury IMPRESSION: No evidence of acute intracranial or cervical spine injury. Electronically Signed   By: Marnee Spring M.D.   On: 01/18/2020 05:32   CT Cervical Spine Wo Contrast  Result Date: 01/18/2020 CLINICAL DATA:  Fall yesterday.  Headache. EXAM: CT HEAD WITHOUT CONTRAST CT CERVICAL SPINE WITHOUT CONTRAST TECHNIQUE: Multidetector CT imaging of the head and cervical spine was performed following the standard protocol without intravenous contrast. Multiplanar CT image reconstructions of the cervical spine were also generated. COMPARISON:  06/04/2019 head CT and 04/30/2019 cervical spine CT FINDINGS: CT HEAD FINDINGS Brain: No evidence of acute infarction, hemorrhage, hydrocephalus, extra-axial collection or mass lesion/mass effect. Generalized cerebral volume loss. Vascular: No hyperdense vessel. Skull: Negative for acute fracture. Remote bilateral nasal bone fracture. Sinuses/Orbits: No visible injury. CT CERVICAL SPINE FINDINGS Alignment: No traumatic malalignment. Skull base and  vertebrae: C3-C5 solid arthrodesis with posterior cerclage wires. No acute fracture Soft tissues and spinal canal: No prevertebral fluid or swelling. No visible canal hematoma. Disc levels: C6-7 C7-T1 disc degeneration with ridging. Severe C7-T1 facet osteoarthritis with subchondral geode formation. Upper chest: No visible injury IMPRESSION: No evidence of acute intracranial or cervical spine injury. Electronically Signed   By: Marnee Spring M.D.   On: 01/18/2020 05:32   DG Hand Complete Right  Result Date: 01/18/2020 CLINICAL DATA:  Fall and possible finger fracture. EXAM: RIGHT HAND - COMPLETE 3+ VIEW COMPARISON:  01/22/2016 FINDINGS: Advanced ring finger PIP joint osteoarthritis with chronic lateral subluxation. First MCP osteoarthritis is also advanced, with subluxation. Widened scapholunate interval and anterior tilting of the lunate on a chronic basis, consistent with ligamentous insufficiency. No acute fracture or dislocation. IMPRESSION: 1. No acute finding. 2. 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 3. Carpal malalignment from remote ligamentous injury. Electronically Signed   By: Marnee Spring M.D.   On: 01/18/2020 05:00   ECHOCARDIOGRAM LIMITED  Result Date: 01/19/2020    ECHOCARDIOGRAM LIMITED REPORT   Patient Name:   Rodney Hartman Date of Exam: 01/19/2020 Medical Rec #:  [id number]     Height:       71.0 in Accession #:    [id number]    Weight:       185.0 lb Date of Birth:  07-Nov-1960     BSA:          2.040 m Patient Age:    59 years      BP:           123/91 mmHg Patient Gender: M  HR:           79 bpm. Exam Location:  Inpatient Procedure: Limited Echo, Limited Color Doppler and Cardiac Doppler Indications:    Syncope 780.2 / R55  History:        Patient has prior history of Echocardiogram examinations, most                 recent 08/13/2015. Risk Factors:Hypertension.  Sonographer:    Leta Junglingiffany Cooper RDCS Referring Phys: [id number] ERIK C HOFFMAN IMPRESSIONS  1. Left  ventricular ejection fraction, by estimation, is 50 to 55%. The left ventricle has low normal function. The left ventricle has no regional wall motion abnormalities. There is mild left ventricular hypertrophy. Left ventricular diastolic parameters are consistent with Grade I diastolic dysfunction (impaired relaxation).  2. Right ventricular systolic function is normal. The right ventricular size is normal. Tricuspid regurgitation signal is inadequate for assessing PA pressure.  3. The mitral valve is normal in structure. No evidence of mitral valve regurgitation. No evidence of mitral stenosis.  4. The aortic valve is tricuspid. Aortic valve regurgitation is not visualized. No aortic stenosis is present. FINDINGS  Left Ventricle: Left ventricular ejection fraction, by estimation, is 50 to 55%. The left ventricle has low normal function. The left ventricle has no regional wall motion abnormalities. The left ventricular internal cavity size was normal in size. There is mild left ventricular hypertrophy. Left ventricular diastolic parameters are consistent with Grade I diastolic dysfunction (impaired relaxation). Right Ventricle: The right ventricular size is normal. No increase in right ventricular wall thickness. Right ventricular systolic function is normal. Tricuspid regurgitation signal is inadequate for assessing PA pressure. Left Atrium: Left atrial size was normal in size. Right Atrium: Right atrial size was normal in size. Mitral Valve: The mitral valve is normal in structure. No evidence of mitral valve stenosis. Aortic Valve: The aortic valve is tricuspid. Aortic valve regurgitation is not visualized. No aortic stenosis is present. Aorta: The aortic root is normal in size and structure. Venous: The inferior vena cava was not well visualized. IAS/Shunts: No atrial level shunt detected by color flow Doppler. LEFT VENTRICLE PLAX 2D LVIDd:         4.40 cm  Diastology LVIDs:         3.60 cm  LV e' medial:    3.71  cm/s LV PW:         0.90 cm  LV E/e' medial:  12.3 LV IVS:        1.30 cm  LV e' lateral:   4.80 cm/s LVOT diam:     2.20 cm  LV E/e' lateral: 9.5 LV SV:         41 LV SV Index:   20 LVOT Area:     3.80 cm                          3D Volume EF:                         3D EF:        45 %                         LV EDV:       134 ml                         LV ESV:  73 ml                         LV SV:        61 ml LEFT ATRIUM             Index LA diam:        3.30 cm 1.62 cm/m LA Vol (A2C):   31.1 ml 15.25 ml/m LA Vol (A4C):   24.3 ml 11.91 ml/m LA Biplane Vol: 28.2 ml 13.82 ml/m  AORTIC VALVE LVOT Vmax:   75.10 cm/s LVOT Vmean:  47.600 cm/s LVOT VTI:    0.109 m MITRAL VALVE MV Area (PHT): 3.53 cm    SHUNTS MV Decel Time: 215 msec    Systemic VTI:  0.11 m MV E velocity: 45.70 cm/s  Systemic Diam: 2.20 cm MV A velocity: 96.40 cm/s MV E/A ratio:  0.47 Marca Ancona MD Electronically signed by Marca Ancona MD Signature Date/Time: 01/19/2020/5:31:21 PM    Final    Recent Labs    01/18/20 1410 01/19/20 0641  AST 174* 156*  ALT 72* 76*  BILITOT 1.9* 1.0   HepB S Ab: pending HepB S Ag: negative HepB c Ab: negative Hep A Ab: negative Hep C Ab: negative  Blood volatiles (ethanol, isopropanol, methanol): all negative   SARS Coronavirus 2 by RT PCR  Date Value Ref Range Status  01/18/2020 POSITIVE (A) NEGATIVE Final    Comment:    RESULT CALLED TO, READ BACK BY AND VERIFIED WITH: DACIAS HERRAS AT 2206 BY MESSAN H. ON 01/18/2020 (NOTE) SARS-CoV-2 target nucleic acids are DETECTED.  The SARS-CoV-2 RNA is generally detectable in upper respiratory specimens during the acute phase of infection. Positive results are indicative of the presence of the identified virus, but do not rule out bacterial infection or co-infection with other pathogens not detected by the test. Clinical correlation with patient history and other diagnostic information is necessary to determine patient infection  status. The expected result is Negative.  Fact Sheet for Patients: BloggerCourse.com  Fact Sheet for Healthcare Providers: SeriousBroker.it  This test is not yet approved or cleared by the [place] FDA and  has been authorized for detection and/or diagnosis of SARS-CoV-2 by FDA under an Emergency Use Authorization (EUA).  This EUA will remain in effect (meaning this test can be used) for the duration of  the COVID-19 declaration under Section 564(b)(1) of the Act, 21 U.S.C. section 360bbb-3(b)(1), unless the authorization is terminated or revoked sooner.        Discharge Instructions:   Signed: Remo Lipps, MD 01/19/2020, 2:30 PM   Pager: [phone]

## 2020-01-19 NOTE — ED Notes (Signed)
Lunch Tray Ordered @ 1107. 

## 2020-01-19 NOTE — ED Notes (Signed)
One of sitter checked halways and ED lobby and bus stop and could not locate

## 2020-01-20 LAB — HEPATITIS B SURFACE ANTIBODY, QUANTITATIVE: Hep B S AB Quant (Post): <3.1 m[IU]/mL — ABNORMAL LOW (ref >9.9)

## 2020-01-27 ENCOUNTER — Encounter (HOSPITAL_COMMUNITY): Payer: Self-pay | Admitting: Emergency Medicine

## 2020-01-27 ENCOUNTER — Emergency Department (HOSPITAL_COMMUNITY): Payer: Self-pay

## 2020-01-27 ENCOUNTER — Emergency Department (HOSPITAL_COMMUNITY)
Admission: EM | Admit: 2020-01-27 | Discharge: 2020-01-27 | Disposition: A | Discharge status: Home / Self Care | Payer: Self-pay | Attending: Emergency Medicine | Admitting: Emergency Medicine

## 2020-01-27 DIAGNOSIS — Z79899 Other long term (current) drug therapy: Secondary | ICD-10-CM | POA: Insufficient documentation

## 2020-01-27 DIAGNOSIS — U071 COVID-19: Secondary | ICD-10-CM | POA: Insufficient documentation

## 2020-01-27 DIAGNOSIS — F17200 Nicotine dependence, unspecified, uncomplicated: Secondary | ICD-10-CM | POA: Insufficient documentation

## 2020-01-27 DIAGNOSIS — I110 Hypertensive heart disease with heart failure: Secondary | ICD-10-CM | POA: Insufficient documentation

## 2020-01-27 DIAGNOSIS — J441 Chronic obstructive pulmonary disease with (acute) exacerbation: Secondary | ICD-10-CM | POA: Insufficient documentation

## 2020-01-27 DIAGNOSIS — Z8616 Personal history of COVID-19: Secondary | ICD-10-CM | POA: Insufficient documentation

## 2020-01-27 DIAGNOSIS — I509 Heart failure, unspecified: Secondary | ICD-10-CM | POA: Insufficient documentation

## 2020-01-27 MED ORDER — PREDNISONE 20 MG PO TABS
60.0000 mg | ORAL_TABLET | Freq: Once | ORAL | Status: AC
  Administered 2020-01-27: 60 mg via ORAL
  Filled 2020-01-27: qty 3

## 2020-01-27 MED ORDER — DOXYCYCLINE HYCLATE 100 MG PO CAPS
100.0000 mg | ORAL_CAPSULE | Freq: Two times a day (BID) | ORAL | 0 refills | Status: DC
Start: 2020-01-27 — End: 2021-03-05

## 2020-01-27 MED ORDER — ALBUTEROL SULFATE HFA 108 (90 BASE) MCG/ACT IN AERS
2.0000 | INHALATION_SPRAY | Freq: Once | RESPIRATORY_TRACT | Status: AC
  Administered 2020-01-27: 2 via RESPIRATORY_TRACT
  Filled 2020-01-27: qty 6.7

## 2020-01-27 MED ORDER — DOXYCYCLINE HYCLATE 100 MG PO TABS
100.0000 mg | ORAL_TABLET | Freq: Once | ORAL | Status: AC
  Administered 2020-01-27: 100 mg via ORAL
  Filled 2020-01-27: qty 1

## 2020-01-27 NOTE — ED Triage Notes (Signed)
Pt here positive 1` week ago states that he still feels sob , pt is a smoker had the j and j shot

## 2020-01-27 NOTE — Discharge Instructions (Addendum)
You were seen in the emergency department for worsening shortness of breath fatigue and cough in the setting of a recent COVID-19 diagnosis.  Your chest x-ray did not show any obvious pneumonia.  We are putting you on some antibiotics for 1 week.  Please follow-up with your doctor.  Return to the emergency department for any worsening or concerning symptoms

## 2020-01-27 NOTE — ED Provider Notes (Signed)
[facility] EMERGENCY DEPARTMENT Provider Note   CSN: [id number] Arrival date & time: 01/27/20  1346     History Chief Complaint  Patient presents with  . Covid Positive  . Shortness of Breath    Rodney Hartman is a 60 y.o. male.  He is here with a complaint of shortness of breath cough productive of some clear sputum and generalized fatigue.  He was diagnosed with COVID a week ago.  Had received J&J shot.  Denies any fever.  He sometimes feels his heart racing.  Eating and drinking okay.  Smoker.  The history is provided by the patient.  Shortness of Breath Severity:  Moderate Onset quality:  Gradual Timing:  Intermittent Progression:  Unchanged Chronicity:  New Relieved by:  None tried Worsened by:  Coughing Ineffective treatments:  None tried Associated symptoms: chest pain (sometimes), cough and sputum production   Associated symptoms: no abdominal pain, no fever, no headaches, no hemoptysis, no rash, no sore throat, no vomiting and no wheezing   Risk factors: alcohol use and tobacco use        Past Medical History:  Diagnosis Date  . Alcohol abuse   . Depression   . Hypertension   . PUD (peptic ulcer disease)     Patient Active Problem List   Diagnosis Date Noted  . Metabolic acidosis, increased anion gap 01/18/2020  . Renal mass, right 01/18/2020  . Elevated liver enzymes 01/18/2020  . First degree AV block 01/18/2020  . Syncope 01/18/2020  . SARS-CoV-2 positive 01/18/2020  . Melena   . Non-intractable vomiting   . Acute gastric ulcer with hemorrhage   . Hypokalemia 06/23/2018  . Upper GI bleed 06/23/2018  . Suicidal ideation   . Substance abuse (HCC)   . Fall 10/07/2016  . Subdural hematoma (HCC) 10/07/2016  . Alcohol dependence with unspecified alcohol-induced disorder (HCC) 01/24/2016  . Atypical chest pain   . Acute congestive heart failure (HCC) 08/12/2015  . Anemia 08/12/2015  . HTN (hypertension) 08/12/2015  . Alcohol abuse  with intoxication, uncomplicated (HCC) 07/31/2015  . MDD (major depressive disorder), recurrent severe, without psychosis (HCC) 07/31/2015  . Suicidal ideation 07/31/2015  . Homicidal ideation 07/31/2015  . Alcohol abuse with alcohol-induced mood disorder (HCC) 01/25/2015  . Alcohol use disorder, severe, dependence (HCC) 12/17/2014  . Alcohol abuse 08/07/2014    Past Surgical History:  Procedure Laterality Date  . BACK SURGERY    . BIOPSY  06/24/2018   Procedure: BIOPSY;  Surgeon: Meryl Dare, MD;  Location: [facility];  Service: Endoscopy;;  . ESOPHAGOGASTRODUODENOSCOPY N/A 06/24/2018   Procedure: ESOPHAGOGASTRODUODENOSCOPY (EGD);  Surgeon: Meryl Dare, MD;  Location: [facility];  Service: Endoscopy;  Laterality: N/A;  . 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   . TONSILLECTOMY         Family History  Problem Relation Age of Onset  . Heart disease Mother     Social History   Tobacco Use  . Smoking status: Current Every Day Smoker  . Smokeless tobacco: Never Used  Substance Use Topics  . Alcohol use: Yes    Comment: every other day   . Drug use: No    Home Medications Prior to Admission medications   Medication Sig Start Date End Date Taking? Authorizing Provider  cyclobenzaprine (FLEXERIL) 10 MG tablet Take 10 mg by mouth at bedtime as needed for  muscle spasms. 10/05/19   [provider]  lisinopril (ZESTRIL) 40 MG tablet Take 40 mg by mouth daily. 10/05/19   [provider]  naproxen (NAPROSYN) 500 MG tablet Take 1 tablet (500 mg total) by mouth every 12 (twelve) hours as needed for mild pain or moderate pain. 08/21/18   Antony Madura, PA-C  omeprazole (PRILOSEC) 20 MG capsule Take 20 mg by mouth daily. 10/05/19   [provider]  traZODone (DESYREL) 150 MG tablet Take 150 mg by mouth at bedtime. 10/05/19   [provider]    Allergies    Patient  has no known allergies.  Review of Systems   Review of Systems  Constitutional: Negative for fever.  HENT: Negative for sore throat.   Eyes: Negative for visual disturbance.  Respiratory: Positive for cough, sputum production and shortness of breath. Negative for hemoptysis and wheezing.   Cardiovascular: Positive for chest pain (sometimes).  Gastrointestinal: Negative for abdominal pain and vomiting.  Genitourinary: Negative for dysuria.  Musculoskeletal: Negative for back pain.  Skin: Negative for rash.  Neurological: Negative for headaches.    Physical Exam Updated Vital Signs BP (!) 128/102 (BP Location: Left Arm)   Pulse (!) 138   Temp 98.6 F (37 C) (Oral)   Resp 14   Ht 5\' 11"  (1.803 m)   Wt 81.6 kg   SpO2 97%   BMI 25.10 kg/m   Physical Exam Vitals and nursing note reviewed.  Constitutional:      Appearance: Normal appearance. He is well-developed and well-nourished.  HENT:     Head: Normocephalic and atraumatic.  Eyes:     Conjunctiva/sclera: Conjunctivae normal.  Cardiovascular:     Rate and Rhythm: Normal rate and regular rhythm.     Heart sounds: No murmur heard.   Pulmonary:     Effort: Pulmonary effort is normal. No respiratory distress.     Breath sounds: Wheezing (few scattered) present.  Abdominal:     Palpations: Abdomen is soft.     Tenderness: There is no abdominal tenderness.  Musculoskeletal:        General: No edema. Normal range of motion.     Cervical back: Neck supple.     Right lower leg: No tenderness. No edema.     Left lower leg: No tenderness. No edema.  Skin:    General: Skin is warm and dry.     Capillary Refill: Capillary refill takes less than 2 seconds.  Neurological:     General: No focal deficit present.     Mental Status: He is alert.     Gait: Gait normal.  Psychiatric:        Mood and Affect: Mood and affect normal.     ED Results / Procedures / Treatments   Labs (all labs ordered are listed, but only abnormal  results are displayed) Labs Reviewed - No data to display  EKG EKG Interpretation  Date/Time:  Wednesday January 27 2020 15:36:37 EST Ventricular Rate:  76 PR Interval:  220 QRS Duration: 80 QT Interval:  436 QTC Calculation: 490 R Axis:   56 Text Interpretation: Sinus rhythm with 1st degree A-V block Right atrial enlargement Anteroseptal infarct , age undetermined Abnormal ECG No significant change since last tracing Confirmed by 02-21-1981 [phone]) on 01/27/2020 4:55:36 PM   Radiology DG Chest Portable 1 View  Result Date: 01/27/2020 CLINICAL DATA:  COVID-19 positive.  Cough. EXAM: PORTABLE CHEST 1 VIEW COMPARISON:  November 12, 2016 FINDINGS: There is scarring  in the lateral left base. Lungs elsewhere are clear. Heart size and pulmonary vascularity are normal. No adenopathy. No bone lesions. IMPRESSION: Scarring lateral left base. No edema or airspace opacity. Heart size normal. Electronically Signed   By: Bretta Bang III M.D.   On: 01/27/2020 16:02    Procedures Procedures (including critical care time)  Medications Ordered in ED Medications  predniSONE (DELTASONE) tablet 60 mg (has no administration in time range)  doxycycline (VIBRA-TABS) tablet 100 mg (has no administration in time range)  albuterol (VENTOLIN HFA) 108 (90 Base) MCG/ACT inhaler 2 puff (has no administration in time range)    ED Course  I have reviewed the triage vital signs and the nursing notes.  Pertinent labs & imaging results that were available during my care of the patient were reviewed by me and considered in my medical decision making (see chart for details).    MDM Rules/Calculators/A&P                         Rodney Hartman was evaluated in Emergency Department on 01/27/2020 for the symptoms described in the history of present illness. He was evaluated in the context of the global COVID-19 pandemic, which necessitated consideration that the patient might be at risk for infection with the  SARS-CoV-2 virus that causes COVID-19. Institutional protocols and algorithms that pertain to the evaluation of patients at risk for COVID-19 are in a state of rapid change based on information released by regulatory bodies including the CDC and federal and state organizations. These policies and algorithms were followed during the patient's care in the ED.  Differential diagnosis includes COVID, pneumonia, bronchitis, COPD exacerbation, arrhythmia, pneumothorax Final Clinical Impression(s) / ED Diagnoses Final diagnoses:  COPD exacerbation (HCC)  COVID-19 virus infection    Rx / DC Orders ED Discharge Orders         Ordered    doxycycline (VIBRAMYCIN) 100 MG capsule  2 times daily        01/27/20 1820           Terrilee Files, MD 01/28/20 1153

## 2020-02-10 ENCOUNTER — Other Ambulatory Visit: Payer: Self-pay | Admitting: *Deleted

## 2020-02-10 NOTE — Patient Outreach (Signed)
[facility]) Care Management  02/10/2020  Rodney Hartman 06/22/1960 [id number]   Referral Date: 02/09/2020 Referral Source: Insurance Referral Reason: 2 or more ED visits in the last 6 months Insurance: [facility]   Outreach attempt #1, unsuccessful.  Listed number is to [employer].  Call was placed to PCP office, provided with new number [phone]).  Call placed to that number, person answering phone state it was the wrong number.    Plan: RN CM will send unsuccessful outreach letter to address listed in chart, will follow up within the next 3-4 business days.  [name], [place], MSN [facility] Care Management  [facility] Manager [phone]

## 2020-02-15 ENCOUNTER — Other Ambulatory Visit: Payer: Self-pay | Admitting: *Deleted

## 2020-02-15 NOTE — Patient Outreach (Signed)
[facility]) Care Management  02/15/2020  Rodney Hartman Feb 25, 1960 [id number]     Referral Date: 02/09/2020 Referral Source: Insurance Referral Reason: 2 or more ED visits in the last 6 months Insurance: [facility]   Outreach attempt #2, unsuccessful to mother, unable to leave voice message.      Plan: RN CM will follow up within the next 3-4 business days.  [name], [place], MSN [facility] Care Management  [facility] Manager [phone]

## 2020-02-18 ENCOUNTER — Other Ambulatory Visit: Payer: Self-pay | Admitting: *Deleted

## 2020-02-18 NOTE — Patient Outreach (Signed)
[facility]) Care Management  02/18/2020  Rodney Hartman 1960-03-27 [id number]   Referral Date:02/09/2020 Referral Source:Insurance Referral Reason:2 or more ED visits in the last 6 months Insurance:[employer]   Outreach attempt #3, unsuccessful to mother, unable to leave voice message.   Call also placed to listed number for member, which is to [employer], no answer.  Will follow up with 4th and final attempt within the next 3 weeks, if remain unsuccessful will close case due to inability to establish contact.  [name], [place], MSN [facility] Care Management  [facility] Manager [phone]

## 2020-02-19 ENCOUNTER — Other Ambulatory Visit: Payer: Self-pay | Admitting: *Deleted

## 2020-02-19 NOTE — Patient Outreach (Signed)
[facility]) Care Management  02/19/2020  Rodney Hartman [birth year] [id number]   Case conference with multidisciplinary team to discuss plan of care due to frequent ED visits.  Notified team of inability to establish contact despite several attempts to various numbers.  A different number was received by [facility] representative [phone]), outreach attempt made, again unsuccessful, HIPAA compliant voice message left.  Will plan to follow up again with final attempt within the next 3 weeks.  [name], [place], MSN [facility] Care Management  [facility] Manager [phone]

## 2020-03-10 ENCOUNTER — Other Ambulatory Visit: Payer: Self-pay | Admitting: *Deleted

## 2020-03-10 NOTE — Patient Outreach (Signed)
[facility]) Care Management  03/10/2020  Rodney Hartman January 09, 1960 [id number]   Outreach attempt #4, unsuccessful to number provided by [facility] [phone]).  HIPAA compliant voice message left.  No response from member after multiple unsuccessful outreach attempts and letter sent.  Will close case at this time due to inability to maintain contact.  Will notify member and primary MD of case closure.  [name], [place], MSN [facility] Care Management  [facility] Manager [phone]

## 2020-11-14 ENCOUNTER — Encounter: Payer: Self-pay | Admitting: *Deleted

## 2020-11-14 NOTE — Congregational Nurse Program (Signed)
  Dept: [phone]   Congregational Nurse Program Note  Date of Encounter: 11/14/2020  Past Medical History: Past Medical History:  Diagnosis Date   Alcohol abuse    Depression    Hypertension    PUD (peptic ulcer disease)     Encounter Details:  CNP Questionnaire - 11/14/20 1237       Questionnaire   Do you give verbal consent to treat you today? Yes    Location Patient Served  [facility]    Visit Setting Church or Organization    Patient Status Homeless    Insurance Uninsured ([employer]s/Self-Pay)    Insurance Referral N/A    Medication N/A    Medical Provider Yes    Screening Referrals N/A    Medical Referral N/A    Medical Appointment Made N/A    Transportation Provided transportation assistance    Intervention Blood pressure;Support    ED Visit Averted N/A    Life-Saving Intervention Made N/A            Client came to nurses's office requesting bus passes and help with IRS tax papers. [employer] students are not at [facility] today. Discussed local options for help with tax papers and filing. Checked BP. BP 124/77 (BP Location: Left Arm, Patient Position: Sitting, Cuff Size: Normal)   Pulse (!) 102 . Client reports he sees Lavinia Sharps as his PCP and has blood pressure prescriptions. Encouraged client to take medication as prescribed. Gave two passes as client plans to go to [employer] for banking and discuss tax filing. Brighten Buzzelli W RN CN

## 2020-12-30 ENCOUNTER — Encounter (HOSPITAL_COMMUNITY): Payer: Self-pay | Admitting: Emergency Medicine

## 2020-12-30 ENCOUNTER — Emergency Department (HOSPITAL_COMMUNITY): Payer: 59

## 2020-12-30 ENCOUNTER — Other Ambulatory Visit: Payer: Self-pay

## 2020-12-30 ENCOUNTER — Observation Stay (HOSPITAL_COMMUNITY)
Admission: EM | Admit: 2020-12-30 | Discharge: 2020-12-31 | Disposition: A | Discharge status: Home / Self Care | Payer: 59 | Attending: Internal Medicine | Admitting: Internal Medicine

## 2020-12-30 DIAGNOSIS — I10 Essential (primary) hypertension: Secondary | ICD-10-CM

## 2020-12-30 DIAGNOSIS — F17200 Nicotine dependence, unspecified, uncomplicated: Secondary | ICD-10-CM | POA: Diagnosis not present

## 2020-12-30 DIAGNOSIS — I110 Hypertensive heart disease with heart failure: Secondary | ICD-10-CM | POA: Diagnosis not present

## 2020-12-30 DIAGNOSIS — R079 Chest pain, unspecified: Secondary | ICD-10-CM

## 2020-12-30 DIAGNOSIS — Y908 Blood alcohol level of 240 mg/100 ml or more: Secondary | ICD-10-CM | POA: Insufficient documentation

## 2020-12-30 DIAGNOSIS — R0789 Other chest pain: Secondary | ICD-10-CM | POA: Diagnosis not present

## 2020-12-30 DIAGNOSIS — Z20822 Contact with and (suspected) exposure to covid-19: Secondary | ICD-10-CM | POA: Diagnosis not present

## 2020-12-30 DIAGNOSIS — E861 Hypovolemia: Secondary | ICD-10-CM

## 2020-12-30 DIAGNOSIS — F10129 Alcohol abuse with intoxication, unspecified: Principal | ICD-10-CM | POA: Insufficient documentation

## 2020-12-30 DIAGNOSIS — Z79899 Other long term (current) drug therapy: Secondary | ICD-10-CM | POA: Insufficient documentation

## 2020-12-30 DIAGNOSIS — I9589 Other hypotension: Secondary | ICD-10-CM

## 2020-12-30 DIAGNOSIS — F10120 Alcohol abuse with intoxication, uncomplicated: Secondary | ICD-10-CM | POA: Diagnosis present

## 2020-12-30 DIAGNOSIS — E871 Hypo-osmolality and hyponatremia: Secondary | ICD-10-CM | POA: Diagnosis not present

## 2020-12-30 DIAGNOSIS — I509 Heart failure, unspecified: Secondary | ICD-10-CM | POA: Diagnosis not present

## 2020-12-30 DIAGNOSIS — R4182 Altered mental status, unspecified: Secondary | ICD-10-CM | POA: Diagnosis present

## 2020-12-30 DIAGNOSIS — F10929 Alcohol use, unspecified with intoxication, unspecified: Secondary | ICD-10-CM

## 2020-12-30 LAB — CBC WITH DIFFERENTIAL/PLATELET
Abs Immature Granulocytes: 0.02 10*3/uL (ref 0.00–0.07)
Basophils Absolute: 0 10*3/uL (ref 0.0–0.1)
Basophils Relative: 0 %
Eosinophils Absolute: 0 10*3/uL (ref 0.0–0.5)
Eosinophils Relative: 1 %
HCT: 36.5 % — ABNORMAL LOW (ref 39.0–52.0)
Hemoglobin: 12.3 g/dL — ABNORMAL LOW (ref 13.0–17.0)
Immature Granulocytes: 0 %
Lymphocytes Relative: 16 %
Lymphs Abs: 0.9 10*3/uL (ref 0.7–4.0)
MCH: 29.9 pg (ref 26.0–34.0)
MCHC: 33.7 g/dL (ref 30.0–36.0)
MCV: 88.8 fL (ref 80.0–100.0)
Monocytes Absolute: 0.5 10*3/uL (ref 0.1–1.0)
Monocytes Relative: 10 %
Neutro Abs: 4 10*3/uL (ref 1.7–7.7)
Neutrophils Relative %: 73 %
Platelets: 142 10*3/uL — ABNORMAL LOW (ref 150–400)
RBC: 4.11 MIL/uL — ABNORMAL LOW (ref 4.22–5.81)
RDW: 16.7 % — ABNORMAL HIGH (ref 11.5–15.5)
WBC: 5.5 10*3/uL (ref 4.0–10.5)
nRBC: 0 % (ref 0.0–0.2)

## 2020-12-30 LAB — COMPREHENSIVE METABOLIC PANEL
ALT: 25 U/L (ref 0–44)
AST: 42 U/L — ABNORMAL HIGH (ref 15–41)
Albumin: 3.5 g/dL (ref 3.5–5.0)
Alkaline Phosphatase: 108 U/L (ref 38–126)
Anion gap: 15 (ref 5–15)
BUN: 6 mg/dL (ref 6–20)
CO2: 18 mmol/L — ABNORMAL LOW (ref 22–32)
Calcium: 8.8 mg/dL — ABNORMAL LOW (ref 8.9–10.3)
Chloride: 91 mmol/L — ABNORMAL LOW (ref 98–111)
Creatinine, Ser: 0.93 mg/dL (ref 0.61–1.24)
GFR, Estimated: >60 mL/min (ref >60)
Glucose, Bld: 122 mg/dL — ABNORMAL HIGH (ref 70–99)
Potassium: 3.3 mmol/L — ABNORMAL LOW (ref 3.5–5.1)
Sodium: 124 mmol/L — ABNORMAL LOW (ref 135–145)
Total Bilirubin: 0.7 mg/dL (ref 0.3–1.2)
Total Protein: 7.2 g/dL (ref 6.5–8.1)

## 2020-12-30 LAB — LIPASE, BLOOD: Lipase: 59 U/L — ABNORMAL HIGH (ref 11–51)

## 2020-12-30 LAB — RAPID URINE DRUG SCREEN, HOSP PERFORMED
Amphetamines: NOT DETECTED
Barbiturates: NOT DETECTED
Benzodiazepines: NOT DETECTED
Cocaine: NOT DETECTED
Opiates: NOT DETECTED
Tetrahydrocannabinol: NOT DETECTED

## 2020-12-30 LAB — MAGNESIUM: Magnesium: 1.8 mg/dL (ref 1.7–2.4)

## 2020-12-30 LAB — ETHANOL: Alcohol, Ethyl (B): 410 mg/dL (ref <10)

## 2020-12-30 LAB — RESP PANEL BY RT-PCR (FLU A&B, COVID) ARPGX2
Influenza A by PCR: NEGATIVE
Influenza B by PCR: NEGATIVE
SARS Coronavirus 2 by RT PCR: NEGATIVE

## 2020-12-30 MED ORDER — ACETAMINOPHEN 650 MG RE SUPP: 650.0000 mg | Freq: Four times a day (QID) | RECTAL | Status: DC | PRN

## 2020-12-30 MED ORDER — THIAMINE HCL 100 MG/ML IJ SOLN: 100.0000 mg | Freq: Every day | INTRAMUSCULAR | Status: DC

## 2020-12-30 MED ORDER — INFLUENZA VAC SPLIT QUAD 0.5 ML IM SUSY: 0.5000 mL | PREFILLED_SYRINGE | INTRAMUSCULAR | Status: DC

## 2020-12-30 MED ORDER — ACETAMINOPHEN 325 MG PO TABS
650.0000 mg | ORAL_TABLET | Freq: Four times a day (QID) | ORAL | Status: DC | PRN
  Administered 2020-12-30 – 2020-12-31 (×2): 650 mg via ORAL
  Filled 2020-12-30 (×3): qty 2

## 2020-12-30 MED ORDER — ONDANSETRON HCL 4 MG/2ML IJ SOLN: 4.0000 mg | Freq: Four times a day (QID) | INTRAMUSCULAR | Status: DC | PRN

## 2020-12-30 MED ORDER — ADULT MULTIVITAMIN W/MINERALS CH
1.0000 | ORAL_TABLET | Freq: Every day | ORAL | Status: DC
  Administered 2020-12-30 – 2020-12-31 (×2): 1 via ORAL
  Filled 2020-12-30 (×2): qty 1

## 2020-12-30 MED ORDER — FOLIC ACID 1 MG PO TABS
1.0000 mg | ORAL_TABLET | Freq: Every day | ORAL | Status: DC
  Administered 2020-12-30 – 2020-12-31 (×2): 1 mg via ORAL
  Filled 2020-12-30 (×2): qty 1

## 2020-12-30 MED ORDER — THIAMINE HCL 100 MG/ML IJ SOLN
100.0000 mg | Freq: Every day | INTRAMUSCULAR | Status: DC
  Administered 2020-12-30: 13:00:00 100 mg via INTRAVENOUS
  Filled 2020-12-30: qty 2

## 2020-12-30 MED ORDER — ENOXAPARIN SODIUM 40 MG/0.4ML IJ SOSY: 40.0000 mg | PREFILLED_SYRINGE | INTRAMUSCULAR | Status: DC

## 2020-12-30 MED ORDER — THIAMINE HCL 100 MG PO TABS
100.0000 mg | ORAL_TABLET | Freq: Every day | ORAL | Status: DC
  Administered 2020-12-30 – 2020-12-31 (×2): 100 mg via ORAL
  Filled 2020-12-30 (×2): qty 1

## 2020-12-30 MED ORDER — SODIUM CHLORIDE 0.9 % IV BOLUS
1000.0000 mL | Freq: Once | INTRAVENOUS | Status: AC
  Administered 2020-12-30: 17:00:00 1000 mL via INTRAVENOUS
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
## 2020-12-30 NOTE — ED Notes (Signed)
Patient pulled IV out.
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
## 2020-12-30 NOTE — Discharge Instructions (Addendum)
                  Intensive Outpatient Programs  [facility]    [facility] [address]     [address] [place],  [place]     [place], [place] [phone]      [phone]  [facility]   [facility]  (Inpatient and outpatient)  [phone] (Suboxone and Methadone) [address]           [phone]           [facility]    [facility] - Intensive Outpatient [address]     [address] [place], [place] [address]     [place], [place]  [phone]      [phone]  [facility] (Outpatient, Inpatient, Chemical  [facility] (Groups and [facility]idental) (insurance only) [phone]    [place], [place]          [phone]       [facility]    [facility] (for caregivers and family) [address]     [address] [place], [place]     [place], [place] [phone]      [phone]  Residential Treatment Programs  [facility]  [facility](2 years) Residential: 90 days)  [facility] [address]      [address] [place], [place]     [place], [place] [phone]      [phone] or [phone]  [facility]    [facility] [address]      [address] [place], [place]     [place], [place] [phone]      [phone]  [facility]   [facility] ([facility]) [address]     [address] [place], [place] [address]     [place], [place] [phone]      [phone] Admissions: 8am-3pm M-F  [facility]: Residential Program (90 Days)              [facility]: [facility]  [place], [place]     [place],   [phone] or [phone]    (Walk in Hours over the weekend or by referral)   [facility]: [facility]:[phone] (for crisis  response 24 hours a day) 
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
## 2020-12-30 NOTE — ED Notes (Signed)
Pt has been deconned
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
## 2020-12-30 NOTE — ED Notes (Signed)
Patient not in the lobby at this time. This writer went around to get vitals on everyone in the lobby and when I got to his wheelchair it was empty

## 2020-12-30 NOTE — ED Notes (Signed)
Pt is calm and appropriate. Intox sx include slowed movements when undressing and bathing and delayed responses
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
## 2020-12-30 NOTE — ED Notes (Signed)
Pt ambulated safely on his own

## 2020-12-30 NOTE — ED Notes (Signed)
Patient transported to CT 

## 2020-12-30 NOTE — ED Provider Notes (Signed)
Emergency Medicine Provider Triage Evaluation Note  Rodney Hartman , a 60 y.o. male  was evaluated in triage.  Brought to the emergency department by EMS.  Family called EMS because patient " was not acting right."  Patient admits to EtOH usage tonight.  Also reports that someone in the house was bringing a bedbug spray however EMS was told the patient was huffing the spray.  He has a white powder on his face.  Per EMS, patient with active bedbugs. EMS does reports nausea and 1 episode of NBNB emesis.  Review of Systems  Positive: Nausea, vomiting Negative: Chest pain, SOB, abd pain  Physical Exam  BP 104/67 (BP Location: Right Arm)    Pulse 99    Temp 98.5 F (36.9 C) (Oral)    Resp 20    SpO2 97%   Gen:   Awake, no distress   Resp:  Normal effort  MSK:   Moves extremities without difficulty  Other:  No peripheral edema  Medical Decision Making  Medically screening exam initiated at 4:03 AM.  Appropriate orders placed.  Rodney Hartman was informed that the remainder of the evaluation will be completed by another provider, this initial triage assessment does not replace that evaluation, and the importance of remaining in the ED until their evaluation is complete.  EtOH usage, nausea and vomiting.   Rodney Hartman, Rodney Hartman 12/30/20 0403    Rodney Lyons, MD 12/30/20 [phone]

## 2020-12-30 NOTE — ED Triage Notes (Signed)
Pt BIB [facility] from home, family reports pt not acting himself, pt admits to ETOH use tonight and told EMS family was using bed bug spray. Given 4mg  zofran by EMS.

## 2020-12-31 LAB — BASIC METABOLIC PANEL
Anion gap: 9 (ref 5–15)
BUN: 7 mg/dL (ref 6–20)
CO2: 24 mmol/L (ref 22–32)
Calcium: 9.2 mg/dL (ref 8.9–10.3)
Chloride: 100 mmol/L (ref 98–111)
Creatinine, Ser: 0.98 mg/dL (ref 0.61–1.24)
GFR, Estimated: >60 mL/min (ref >60)
Glucose, Bld: 99 mg/dL (ref 70–99)
Potassium: 3.7 mmol/L (ref 3.5–5.1)
Sodium: 133 mmol/L — ABNORMAL LOW (ref 135–145)

## 2020-12-31 MED ORDER — TRAMADOL HCL 50 MG PO TABS: 50.0000 mg | ORAL_TABLET | Freq: Four times a day (QID) | ORAL | 0 refills | Status: AC | PRN

## 2020-12-31 MED ORDER — ACETAMINOPHEN 325 MG PO TABS: 650.0000 mg | ORAL_TABLET | Freq: Four times a day (QID) | ORAL | 0 refills | Status: AC | PRN

## 2020-12-31 NOTE — Progress Notes (Signed)
Md notified pt has mild tremors and score 1 ciwa. No orders for ativan md aware and does not want to medicate for now only if pt behaviors or issue becomes worse then nurse will need to call for orders

## 2020-12-31 NOTE — TOC Progression Note (Signed)
Transition of Care [facility]) - Progression Note    Patient Details  Name: Rodney Hartman MRN: [id number] Date of Birth: Jun 27, 1960  Transition of Care [facility]) CM/SW Contact  Levada Schilling Phone Number: 12/31/2020, 2:56 PM  Clinical Narrative:     CSW received a consult from RN Ivy for clothing, shoes and transportation for pt. Pt received clothing.  CSW will arrange transportation for pt at disposition. Transition of Care Department [facility]) has reviewed patient and no TOC needs have been identified at this time. We will continue to monitor patient advancement through interdisciplinary progression rounds. If new patient transition needs arise, please place a TOC consult.      Patient Details  Name: Rodney Hartman Date of Birth: 1960-11-01   Transition of Care [facility]) CM/SW Contact:    Levada Schilling Phone Number: 12/31/2020, 3:00 PM          Expected Discharge Plan and Services           Expected Discharge Date: 12/31/20                                     Social Determinants of Health (SDOH) Interventions    Readmission Risk Interventions No flowsheet data found.

## 2020-12-31 NOTE — Progress Notes (Signed)
Nursing Discharge Note   Admit Date: 12/30/2020  Discharge date: 12/31/2020   Rodney Hartman is to be discharged home per MD order.  AVS completed. Reviewed with patient at bedside. Highlighted copy provided for patient to take home.  Patient able to verbalize understanding of discharge instructions. PIV removed. Patient stable upon discharge.  TOC SW Tara arranged for patient to have Lyft ride home. Lyft confirmed and patient wheeled to lobby for transportation home via [place]. Resources also provided for substance abuse for ETOH use on AVS, reviewed with patient.     Discharge Instructions                       Intensive Outpatient Programs  [facility]    [facility] [address]     [address] [place],  [place]     [place], [place] [phone]      [phone]  [facility]   [facility]  (Inpatient and outpatient)  [phone] (Suboxone and Methadone) [address]           [phone]           [facility]    [facility] - Intensive Outpatient [address]     [address] [place], [place] [address]     [place], [place]  [phone]      [phone]  [facility] (Outpatient, Inpatient, [facility] (Groups and Residental) (insurance only) [phone]    [place], [place]          [phone]       [facility]    [facility] (for caregivers and family) [address]     [address] [place], [place]     [place], [place] [phone]      [phone]  Residential Treatment Programs  [facility]  [place](2 years) Residential: 90 days)  [facility] ([facility] [address] [facility]      [address] [place], [place]     [place], [place] [phone]      [phone] or [phone]  [facility]    [facility] [address]      [address] [place], [place]     [place], [place] [phone]      [phone]  [facility]   [facility] ([facility]) [address]     [address] [place], [place] [address]     [place], [place] [phone]      [phone] Admissions: 8am-3pm M-F  [facility]: Residential Program [phone] Days)              [facility]: [facility]  [place], [place]     [place], [place]  [phone] or [phone]    (Walk in Hours over the weekend or by referral)   [facility]: [facility]:[phone] (for crisis response 24 hours a day)        Allergies as of 12/31/2020   No Known Allergies      Medication List     TAKE these medications    acetaminophen 325 MG tablet Commonly known as: TYLENOL Take 2 tablets (650 mg total) by mouth every 6 (six) hours as needed for mild pain (or Fever >/= 101).   doxycycline 100 MG capsule Commonly known as: VIBRAMYCIN  Take 1 capsule (100 mg total) by mouth 2 (two) times daily.   lisinopril 40 MG tablet Commonly known as: ZESTRIL Take 40 mg by mouth daily.   omeprazole 20 MG capsule Commonly known as: PRILOSEC Take 20 mg by mouth daily.   traMADol 50 MG tablet Commonly known as: Ultram Take 1 tablet (50 mg total) by mouth every 6 (six) hours as needed for up to 3 days.   traZODone 150 MG tablet Commonly known as: DESYREL Take 150 mg by mouth at bedtime.         Discharge Instructions/ Education: Discharge instructions given to patient with verbalized understanding. Discharge education completed with patient including: follow up instructions, medication list, discharge activities, and limitations if indicated. Patient escorted via wheelchair to lobby and discharged home via ridesharing vehicle.

## 2020-12-31 NOTE — Discharge Summary (Addendum)
Name: Rodney Hartman MRN: [id number] DOB: 07-04-1960 60 y.o. PCP: Lavinia Sharps, NP  Date of Admission: 12/30/2020  3:09 AM Date of Discharge: 12/31/20 Attending Physician: Miguel Aschoff, MD  Discharge Diagnosis: 1. Alcohol intoxication 2. Hypovolemic hyponatremia 3. MSK pain 4. Hypertension  Discharge Medications: Allergies as of 12/31/2020   No Known Allergies      Medication List     TAKE these medications    acetaminophen 325 MG tablet Commonly known as: TYLENOL Take 2 tablets (650 mg total) by mouth every 6 (six) hours as needed for mild pain (or Fever >/= 101).   doxycycline 100 MG capsule Commonly known as: VIBRAMYCIN Take 1 capsule (100 mg total) by mouth 2 (two) times daily.   lisinopril 40 MG tablet Commonly known as: ZESTRIL Take 40 mg by mouth daily.   omeprazole 20 MG capsule Commonly known as: PRILOSEC Take 20 mg by mouth daily.   traMADol 50 MG tablet Commonly known as: Ultram Take 1 tablet (50 mg total) by mouth every 6 (six) hours as needed for up to 3 days.   traZODone 150 MG tablet Commonly known as: DESYREL Take 150 mg by mouth at bedtime.        Disposition and follow-up:   Mr.Juandavid AKIVA BRASSFIELD was discharged from [facility] in Good condition.  At the hospital follow up visit please address:  1. MSK pain- Follow up with internal medicine clinic for HFU  2.  Labs / imaging needed at time of follow-up: None  3.  Pending labs/ test needing follow-up: None  Follow-up Appointments:  Follow-up Information     [facility]. Schedule an appointment as soon as possible for a visit in 3 day(s).   Contact information: [address] [place] [address] [phone]                Hospital Course by problem list: 1. Alcohol intoxication- Presented to the ED with altered mentas status 2/2 alcohol intoxication. Etoh level >410. Pt admitted to heavy drinking the  night before. Pt does not recall any falls or trauma. CT head negative Pt received supportive care: 1L bolus NaCl, multivitamins, thiamine, Vit B1, Folic acid, Zofran, and tylenol. CIWA w/o ativan, CIWA score of 1. Overnight, pt improved. A&Ox4, mentation at baseline with no neurological deficient. Pt well for discharge. 2. Hypovolemic hyponatremia-On admission pt was found to be hyponatremic. Pt reports decreased fluid intake over the last several days and  an episode of vomiting. Heavy alcohol use as well. Pt appeared slightly dry on exam. Pt received 1L bolus NaCl during hospitalization. Sodium levels improved 124-->133; pt is asymptomatic and back at baseline. 3. MSK pain- Pt endorsed sternal chest pain on admission. Described as tenderness when cough or touch the area. He does recall chest contusion during a bad fall some weeks ago. Pain not cardiogenic in nature, EKG NSR, vitals stable, pain reproducible upon palpation. CXR negative for fractures, no active cardiopulmonary disease. Pt received Tylenol for pain control. 4. Hypertension- Pt takes lisinopril 40mg  daily at home; on admission, pt was normotensive and remained normotensive throughout hospitalization; lisinopril was held.  Discharge Exam:   BP 128/78 (BP Location: Right Arm)    Pulse 89    Temp 98.4 F (36.9 C) (Oral)    Resp 18    Ht 5' 10.5" (1.791 m)    Wt 80.2 kg    SpO2 96%    BMI 25.01 kg/m  Discharge exam: Physical Exam Constitutional:      General: He is awake. He is not in acute distress.    Appearance: Normal appearance.  HENT:     Head: Normocephalic and atraumatic.  Cardiovascular:     Rate and Rhythm: Normal rate and regular rhythm.     Heart sounds: Normal heart sounds.  Pulmonary:     Effort: Pulmonary effort is normal.     Breath sounds: Normal breath sounds and air entry.  Abdominal:     General: Abdomen is flat. Bowel sounds are normal.     Palpations: Abdomen is soft.     Tenderness: There is no abdominal  tenderness.  Musculoskeletal:     Right lower leg: No edema.     Left lower leg: No edema.  Skin:    General: Skin is warm and dry.  Neurological:     General: No focal deficit present.     Mental Status: He is alert and oriented to person, place, and time. Mental status is at baseline.  Psychiatric:        Behavior: Behavior normal. Behavior is cooperative.     Pertinent Labs, Studies, and Procedures:  CT Head Wo Contrast  Result Date: 12/30/2020 CLINICAL DATA:  Head trauma EXAM: CT HEAD WITHOUT CONTRAST TECHNIQUE: Contiguous axial images were obtained from the base of the skull through the vertex without intravenous contrast. COMPARISON:  CT head 01/18/2020 FINDINGS: Brain: There is no evidence of acute intracranial hemorrhage, extra-axial fluid collection, or acute infarct. Parenchymal volume is normal. The ventricles are normal in size. There is no mass lesion. There is no midline shift. Vascular: No hyperdense vessel or unexpected calcification. Skull: Remote fractures of the bilateral nasal bones and lamina papyracea are unchanged. There is no acute fracture. Sinuses/Orbits: The imaged paranasal sinuses are clear. The globes and orbits are unremarkable. Other: The mastoid air cells are clear. IMPRESSION: No acute intracranial pathology. Electronically Signed   By: Lesia Hausen M.D.   On: 12/30/2020 11:14   DG CHEST PORT 1 VIEW  Result Date: 12/30/2020 CLINICAL DATA:  Chest pain EXAM: PORTABLE CHEST 1 VIEW COMPARISON:  January 27, 2020. FINDINGS: The heart size and mediastinal contours are within normal limits. Scarring in the left lung base similar prior. No new focal consolidation. The visualized skeletal structures are unremarkable. IMPRESSION: Scarring in the left lung base, similar to prior. No new focal consolidation. Electronically Signed   By: Maudry Mayhew M.D.   On: 12/30/2020 17:39     Discharge Instructions: Discharge Instructions     Call MD for:   Complete by: As  directed    Call MD for:  difficulty breathing, headache or visual disturbances   Complete by: As directed    Call MD for:  extreme fatigue   Complete by: As directed    Call MD for:  hives   Complete by: As directed    Call MD for:  persistant dizziness or light-headedness   Complete by: As directed    Call MD for:  persistant nausea and vomiting   Complete by: As directed    Call MD for:  redness, tenderness, or signs of infection (pain, swelling, redness, odor or green/yellow discharge around incision site)   Complete by: As directed    Call MD for:  severe uncontrolled pain   Complete by: As directed    Call MD for:  temperature >100.4   Complete by: As directed    Diet - low sodium heart healthy  Complete by: As directed    Discharge instructions   Complete by: As directed    Sorry that you are still experiencing chest tenderness; The x-ray did not show any fractures. Please take Tylenol as needed for the pain. I have also wrote you a prescription for tramadol to help with the pain.  Please follow up in the clinic here at [facility], we would be happy to care for you.   Increase activity slowly   Complete by: As directed        Signed: Dellis Filbert, MD 12/31/2020, 12:13 PM   Pager: [phone]

## 2020-12-31 NOTE — Progress Notes (Signed)
Spoke with patient about discharge orders to go home. Patient confirms he lives in a boarding house and does not have a ride home.  Patient also with increased ETOH use, states he drinks 3 40 oz a day. He states that his ETOH use increased with the sudden passing of his mother.   Spoke with Delice Bison LCSW regarding discharge, needing ride home and resources for ETOH use/abuse in the community.   Delice Bison to confirm transportation set up and printing of ETOH use/abuse resources.

## 2020-12-31 NOTE — TOC CAGE-AID Note (Addendum)
Transition of Care [facility]) - CAGE-AID Screening   Patient Details  Name: Rodney Hartman MRN: [id number] Date of Birth: 05-Oct-1960  Transition of Care [facility]) CM/SW Contact:    Gabrielle Dare Phone Number: 12/31/2020, 2:27 PM   Clinical Narrative: CSW met with dpt at bedside. Pt's score total was a 3 from Cage assessment. Pt's mother died 2 months ago.  Pt started drinking 3 40 ounces of beer daily.  Resources for Substance abuse were placed in AVS for review.   CAGE-AID Screening:    Have You Ever Felt You Ought to Cut Down on Your Drinking or Drug Use?: Yes Have People Annoyed You By Critizing Your Drinking Or Drug Use?: Yes Have You Felt Bad Or Guilty About Your Drinking Or Drug Use?: Yes Have You Ever Had a Drink or Used Drugs First Thing In The Morning to Steady Your Nerves or to Get Rid of a Hangover?: No CAGE-AID Score: 3  Substance Abuse Education Offered: Yes  Substance abuse interventions: Patient Counseling

## 2021-03-03 ENCOUNTER — Emergency Department (HOSPITAL_COMMUNITY): Payer: Self-pay

## 2021-03-03 ENCOUNTER — Inpatient Hospital Stay (HOSPITAL_COMMUNITY)
Admission: EM | Admit: 2021-03-03 | Discharge: 2021-03-05 | DRG: 378 | Disposition: A | Discharge status: Home / Self Care | Payer: Self-pay | Attending: Internal Medicine | Admitting: Internal Medicine

## 2021-03-03 ENCOUNTER — Inpatient Hospital Stay (HOSPITAL_COMMUNITY): Payer: Self-pay

## 2021-03-03 DIAGNOSIS — Z8249 Family history of ischemic heart disease and other diseases of the circulatory system: Secondary | ICD-10-CM

## 2021-03-03 DIAGNOSIS — F1010 Alcohol abuse, uncomplicated: Secondary | ICD-10-CM | POA: Diagnosis present

## 2021-03-03 DIAGNOSIS — D62 Acute posthemorrhagic anemia: Secondary | ICD-10-CM | POA: Diagnosis present

## 2021-03-03 DIAGNOSIS — R7989 Other specified abnormal findings of blood chemistry: Secondary | ICD-10-CM

## 2021-03-03 DIAGNOSIS — F419 Anxiety disorder, unspecified: Secondary | ICD-10-CM | POA: Diagnosis present

## 2021-03-03 DIAGNOSIS — K921 Melena: Secondary | ICD-10-CM | POA: Diagnosis present

## 2021-03-03 DIAGNOSIS — Y9355 Activity, bike riding: Secondary | ICD-10-CM

## 2021-03-03 DIAGNOSIS — N179 Acute kidney failure, unspecified: Secondary | ICD-10-CM | POA: Diagnosis present

## 2021-03-03 DIAGNOSIS — R55 Syncope and collapse: Secondary | ICD-10-CM | POA: Diagnosis present

## 2021-03-03 DIAGNOSIS — I959 Hypotension, unspecified: Secondary | ICD-10-CM | POA: Diagnosis present

## 2021-03-03 DIAGNOSIS — E860 Dehydration: Secondary | ICD-10-CM | POA: Diagnosis present

## 2021-03-03 DIAGNOSIS — K922 Gastrointestinal hemorrhage, unspecified: Principal | ICD-10-CM | POA: Diagnosis present

## 2021-03-03 DIAGNOSIS — Z20822 Contact with and (suspected) exposure to covid-19: Secondary | ICD-10-CM | POA: Diagnosis present

## 2021-03-03 DIAGNOSIS — I248 Other forms of acute ischemic heart disease: Secondary | ICD-10-CM | POA: Diagnosis present

## 2021-03-03 DIAGNOSIS — F32A Depression, unspecified: Secondary | ICD-10-CM | POA: Diagnosis present

## 2021-03-03 DIAGNOSIS — I10 Essential (primary) hypertension: Secondary | ICD-10-CM | POA: Diagnosis present

## 2021-03-03 DIAGNOSIS — E876 Hypokalemia: Secondary | ICD-10-CM | POA: Diagnosis present

## 2021-03-03 DIAGNOSIS — Z79899 Other long term (current) drug therapy: Secondary | ICD-10-CM

## 2021-03-03 LAB — CBC WITH DIFFERENTIAL/PLATELET
Abs Immature Granulocytes: 0.02 10*3/uL (ref 0.00–0.07)
Basophils Absolute: 0 10*3/uL (ref 0.0–0.1)
Basophils Relative: 0 %
Eosinophils Absolute: 0 10*3/uL (ref 0.0–0.5)
Eosinophils Relative: 0 %
HCT: 35 % — ABNORMAL LOW (ref 39.0–52.0)
Hemoglobin: 11.3 g/dL — ABNORMAL LOW (ref 13.0–17.0)
Immature Granulocytes: 0 %
Lymphocytes Relative: 8 %
Lymphs Abs: 0.7 10*3/uL (ref 0.7–4.0)
MCH: 30.5 pg (ref 26.0–34.0)
MCHC: 32.3 g/dL (ref 30.0–36.0)
MCV: 94.3 fL (ref 80.0–100.0)
Monocytes Absolute: 0.7 10*3/uL (ref 0.1–1.0)
Monocytes Relative: 8 %
Neutro Abs: 7.7 10*3/uL (ref 1.7–7.7)
Neutrophils Relative %: 84 %
Platelets: 189 10*3/uL (ref 150–400)
RBC: 3.71 MIL/uL — ABNORMAL LOW (ref 4.22–5.81)
RDW: 16.5 % — ABNORMAL HIGH (ref 11.5–15.5)
WBC: 9.2 10*3/uL (ref 4.0–10.5)
nRBC: 0 % (ref 0.0–0.2)

## 2021-03-03 LAB — COMPREHENSIVE METABOLIC PANEL
ALT: 29 U/L (ref 0–44)
AST: 52 U/L — ABNORMAL HIGH (ref 15–41)
Albumin: 3.4 g/dL — ABNORMAL LOW (ref 3.5–5.0)
Alkaline Phosphatase: 81 U/L (ref 38–126)
Anion gap: 14 (ref 5–15)
BUN: 36 mg/dL — ABNORMAL HIGH (ref 6–20)
CO2: 19 mmol/L — ABNORMAL LOW (ref 22–32)
Calcium: 9.2 mg/dL (ref 8.9–10.3)
Chloride: 101 mmol/L (ref 98–111)
Creatinine, Ser: 1.4 mg/dL — ABNORMAL HIGH (ref 0.61–1.24)
GFR, Estimated: 58 mL/min — ABNORMAL LOW (ref >60)
Glucose, Bld: 91 mg/dL (ref 70–99)
Potassium: 3.9 mmol/L (ref 3.5–5.1)
Sodium: 134 mmol/L — ABNORMAL LOW (ref 135–145)
Total Bilirubin: 2.2 mg/dL — ABNORMAL HIGH (ref 0.3–1.2)
Total Protein: 6.7 g/dL (ref 6.5–8.1)

## 2021-03-03 LAB — PROTIME-INR
INR: 1 (ref 0.8–1.2)
Prothrombin Time: 13.2 seconds (ref 11.4–15.2)

## 2021-03-03 LAB — RESP PANEL BY RT-PCR (FLU A&B, COVID) ARPGX2
Influenza A by PCR: NEGATIVE
Influenza B by PCR: NEGATIVE
SARS Coronavirus 2 by RT PCR: NEGATIVE

## 2021-03-03 LAB — TYPE AND SCREEN
ABO/RH(D): A POS
Antibody Screen: NEGATIVE

## 2021-03-03 LAB — POC OCCULT BLOOD, ED: Fecal Occult Bld: POSITIVE — AB

## 2021-03-03 LAB — HIV ANTIBODY (ROUTINE TESTING W REFLEX): HIV Screen 4th Generation wRfx: NONREACTIVE

## 2021-03-03 LAB — HEMOGLOBIN AND HEMATOCRIT, BLOOD
HCT: 31.6 % — ABNORMAL LOW (ref 39.0–52.0)
Hemoglobin: 10.5 g/dL — ABNORMAL LOW (ref 13.0–17.0)

## 2021-03-03 LAB — TROPONIN I (HIGH SENSITIVITY)
Troponin I (High Sensitivity): 25 ng/L — ABNORMAL HIGH (ref <18)
Troponin I (High Sensitivity): 32 ng/L — ABNORMAL HIGH (ref <18)

## 2021-03-03 LAB — ETHANOL: Alcohol, Ethyl (B): <10 mg/dL (ref <10)

## 2021-03-03 MED ORDER — HYDRALAZINE HCL 25 MG PO TABS: 25.0000 mg | ORAL_TABLET | Freq: Four times a day (QID) | ORAL | Status: DC | PRN

## 2021-03-03 MED ORDER — THIAMINE HCL 100 MG/ML IJ SOLN
100.0000 mg | Freq: Every day | INTRAMUSCULAR | Status: DC
  Administered 2021-03-03: 100 mg via INTRAVENOUS
  Filled 2021-03-03: qty 2

## 2021-03-03 MED ORDER — PANTOPRAZOLE SODIUM 40 MG IV SOLR: 40.0000 mg | Freq: Two times a day (BID) | INTRAVENOUS | Status: DC

## 2021-03-03 MED ORDER — LORAZEPAM 2 MG/ML IJ SOLN: 1.0000 mg | INTRAMUSCULAR | Status: DC | PRN

## 2021-03-03 MED ORDER — FOLIC ACID 1 MG PO TABS
1.0000 mg | ORAL_TABLET | Freq: Every day | ORAL | Status: DC
  Administered 2021-03-03 – 2021-03-05 (×3): 1 mg via ORAL
  Filled 2021-03-03 (×3): qty 1

## 2021-03-03 MED ORDER — PANTOPRAZOLE INFUSION (NEW) - SIMPLE MED
8.0000 mg/h | INTRAVENOUS | Status: DC
  Administered 2021-03-03 – 2021-03-05 (×5): 8 mg/h via INTRAVENOUS
  Filled 2021-03-03 (×2): qty 80
  Filled 2021-03-03: qty 100
  Filled 2021-03-03 (×2): qty 80

## 2021-03-03 MED ORDER — PANTOPRAZOLE 80MG IVPB - SIMPLE MED
80.0000 mg | Freq: Once | INTRAVENOUS | Status: DC
  Filled 2021-03-03 (×2): qty 100

## 2021-03-03 MED ORDER — ADULT MULTIVITAMIN W/MINERALS CH
1.0000 | ORAL_TABLET | Freq: Every day | ORAL | Status: DC
  Administered 2021-03-03 – 2021-03-05 (×3): 1 via ORAL
  Filled 2021-03-03 (×3): qty 1

## 2021-03-03 MED ORDER — THIAMINE HCL 100 MG PO TABS
100.0000 mg | ORAL_TABLET | Freq: Every day | ORAL | Status: DC
  Administered 2021-03-04 – 2021-03-05 (×2): 100 mg via ORAL
  Filled 2021-03-03 (×3): qty 1

## 2021-03-03 MED ORDER — TRAZODONE HCL 150 MG PO TABS
150.0000 mg | ORAL_TABLET | Freq: Every day | ORAL | Status: DC
  Administered 2021-03-03 – 2021-03-04 (×2): 150 mg via ORAL
  Filled 2021-03-03 (×2): qty 1

## 2021-03-03 MED ORDER — LORAZEPAM 1 MG PO TABS: 1.0000 mg | ORAL_TABLET | ORAL | Status: DC | PRN

## 2021-03-03 MED ORDER — PANTOPRAZOLE SODIUM 40 MG IV SOLR
40.0000 mg | Freq: Two times a day (BID) | INTRAVENOUS | Status: DC
Start: 2021-03-07 — End: 2021-03-05

## 2021-03-03 MED ORDER — SODIUM CHLORIDE 0.9 % IV SOLN: INTRAVENOUS | Status: AC
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
## 2021-03-03 NOTE — ED Notes (Signed)
Pt transported to [facility]
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
## 2021-03-04 ENCOUNTER — Inpatient Hospital Stay (HOSPITAL_COMMUNITY): Payer: Self-pay | Admitting: Certified Registered Nurse Anesthetist

## 2021-03-04 ENCOUNTER — Encounter (HOSPITAL_COMMUNITY)
Admission: EM | Disposition: A | Discharge status: Home / Self Care | Payer: Self-pay | Source: Home / Self Care | Attending: Internal Medicine

## 2021-03-04 ENCOUNTER — Other Ambulatory Visit: Payer: Self-pay

## 2021-03-04 ENCOUNTER — Encounter (HOSPITAL_COMMUNITY): Payer: Self-pay | Admitting: Internal Medicine

## 2021-03-04 DIAGNOSIS — K259 Gastric ulcer, unspecified as acute or chronic, without hemorrhage or perforation: Secondary | ICD-10-CM

## 2021-03-04 DIAGNOSIS — I110 Hypertensive heart disease with heart failure: Secondary | ICD-10-CM

## 2021-03-04 DIAGNOSIS — K2980 Duodenitis without bleeding: Secondary | ICD-10-CM

## 2021-03-04 DIAGNOSIS — K921 Melena: Secondary | ICD-10-CM

## 2021-03-04 DIAGNOSIS — I509 Heart failure, unspecified: Secondary | ICD-10-CM

## 2021-03-04 DIAGNOSIS — D62 Acute posthemorrhagic anemia: Secondary | ICD-10-CM

## 2021-03-04 HISTORY — PX: BIOPSY: SHX5522

## 2021-03-04 HISTORY — PX: ESOPHAGOGASTRODUODENOSCOPY (EGD) WITH PROPOFOL: SHX5813

## 2021-03-04 LAB — BASIC METABOLIC PANEL
Anion gap: 10 (ref 5–15)
BUN: 27 mg/dL — ABNORMAL HIGH (ref 6–20)
CO2: 19 mmol/L — ABNORMAL LOW (ref 22–32)
Calcium: 8.6 mg/dL — ABNORMAL LOW (ref 8.9–10.3)
Chloride: 106 mmol/L (ref 98–111)
Creatinine, Ser: 0.95 mg/dL (ref 0.61–1.24)
GFR, Estimated: >60 mL/min (ref >60)
Glucose, Bld: 100 mg/dL — ABNORMAL HIGH (ref 70–99)
Potassium: 2.7 mmol/L — CL (ref 3.5–5.1)
Sodium: 135 mmol/L (ref 135–145)

## 2021-03-04 LAB — CBC
HCT: 27.5 % — ABNORMAL LOW (ref 39.0–52.0)
Hemoglobin: 9.1 g/dL — ABNORMAL LOW (ref 13.0–17.0)
MCH: 30.1 pg (ref 26.0–34.0)
MCHC: 33.1 g/dL (ref 30.0–36.0)
MCV: 91.1 fL (ref 80.0–100.0)
Platelets: 146 10*3/uL — ABNORMAL LOW (ref 150–400)
RBC: 3.02 MIL/uL — ABNORMAL LOW (ref 4.22–5.81)
RDW: 15.9 % — ABNORMAL HIGH (ref 11.5–15.5)
WBC: 5.4 10*3/uL (ref 4.0–10.5)
nRBC: 0 % (ref 0.0–0.2)

## 2021-03-04 LAB — TROPONIN I (HIGH SENSITIVITY)
Troponin I (High Sensitivity): 28 ng/L — ABNORMAL HIGH (ref <18)
Troponin I (High Sensitivity): 26 ng/L — ABNORMAL HIGH (ref <18)

## 2021-03-04 LAB — MAGNESIUM: Magnesium: 2.2 mg/dL (ref 1.7–2.4)

## 2021-03-04 LAB — HEMOGLOBIN AND HEMATOCRIT, BLOOD
HCT: 28 % — ABNORMAL LOW (ref 39.0–52.0)
HCT: 28.9 % — ABNORMAL LOW (ref 39.0–52.0)
Hemoglobin: 9.5 g/dL — ABNORMAL LOW (ref 13.0–17.0)
Hemoglobin: 9.7 g/dL — ABNORMAL LOW (ref 13.0–17.0)

## 2021-03-04 LAB — POTASSIUM: Potassium: 3.5 mmol/L (ref 3.5–5.1)

## 2021-03-04 SURGERY — ESOPHAGOGASTRODUODENOSCOPY (EGD) WITH PROPOFOL
Anesthesia: Monitor Anesthesia Care

## 2021-03-04 MED ORDER — PROPOFOL 500 MG/50ML IV EMUL
INTRAVENOUS | Status: DC | PRN
  Administered 2021-03-04: 150 ug/kg/min via INTRAVENOUS

## 2021-03-04 MED ORDER — LACTATED RINGERS IV SOLN: INTRAVENOUS | Status: DC | PRN

## 2021-03-04 MED ORDER — PROPOFOL 10 MG/ML IV BOLUS
INTRAVENOUS | Status: DC | PRN
  Administered 2021-03-04 (×2): 20 mg via INTRAVENOUS

## 2021-03-04 MED ORDER — MAGNESIUM SULFATE 2 GM/50ML IV SOLN
2.0000 g | Freq: Once | INTRAVENOUS | Status: AC
  Administered 2021-03-04: 2 g via INTRAVENOUS
  Filled 2021-03-04: qty 50

## 2021-03-04 MED ORDER — POTASSIUM CHLORIDE 10 MEQ/100ML IV SOLN
10.0000 meq | INTRAVENOUS | Status: AC
  Administered 2021-03-04 (×6): 10 meq via INTRAVENOUS
  Filled 2021-03-04 (×4): qty 100

## 2021-03-04 SURGICAL SUPPLY — 15 items

## 2021-03-04 NOTE — Anesthesia Postprocedure Evaluation (Signed)
Anesthesia Post Note  Patient: Rodney Hartman  Procedure(s) Performed: ESOPHAGOGASTRODUODENOSCOPY (EGD) WITH PROPOFOL BIOPSY     Patient location during evaluation: Endoscopy Anesthesia Type: MAC Level of consciousness: awake and alert Pain management: pain level controlled Vital Signs Assessment: post-procedure vital signs reviewed and stable Respiratory status: spontaneous breathing, nonlabored ventilation, respiratory function stable and patient connected to nasal cannula oxygen Cardiovascular status: stable and blood pressure returned to baseline Postop Assessment: no apparent nausea or vomiting Anesthetic complications: no   No notable events documented.  Last Vitals:  Vitals:   03/04/21 1530 03/04/21 1715  BP: 121/81 116/69  Pulse: 84 93  Resp: 17 17  Temp:  36.5 C  SpO2: 100% 100%    Last Pain:  Vitals:   03/04/21 1715  TempSrc: Oral  PainSc:                  March Rummage Kristene Liberati

## 2021-03-04 NOTE — Anesthesia Preprocedure Evaluation (Signed)
Anesthesia Evaluation  Patient identified by MRN, date of birth, ID band Patient awake    Reviewed: Allergy & Precautions, NPO status , Patient's Chart, lab work & pertinent test results  Airway Mallampati: II  TM Distance: >3 FB Neck ROM: Full    Dental  (+) Poor Dentition, Missing   Pulmonary neg pulmonary ROS, Current Smoker,    Pulmonary exam normal        Cardiovascular hypertension, +CHF   Rhythm:Regular Rate:Normal     Neuro/Psych Depression negative neurological ROS     GI/Hepatic PUD, GERD  Medicated,(+)     substance abuse  alcohol use, GIB   Endo/Other  negative endocrine ROS  Renal/GU negative Renal ROS  negative genitourinary   Musculoskeletal negative musculoskeletal ROS (+)   Abdominal Normal abdominal exam  (+)   Peds  Hematology  (+) Blood dyscrasia, anemia ,   Anesthesia Other Findings   Reproductive/Obstetrics                             Anesthesia Physical Anesthesia Plan  ASA: 3  Anesthesia Plan: MAC   Post-op Pain Management:    Induction: Intravenous  PONV Risk Score and Plan: 1 and Propofol infusion and Treatment may vary due to age or medical condition  Airway Management Planned: Natural Airway, Simple Face Mask and Nasal Cannula  Additional Equipment: None  Intra-op Plan:   Post-operative Plan:   Informed Consent: I have reviewed the patients History and Physical, chart, labs and discussed the procedure including the risks, benefits and alternatives for the proposed anesthesia with the patient or authorized representative who has indicated his/her understanding and acceptance.     Dental advisory given  Plan Discussed with: CRNA  Anesthesia Plan Comments: (Lab Results      Component                Value               Date                      WBC                      5.4                 03/04/2021                HGB                       9.5 (L)             03/04/2021                HCT                      28.0 (L)            03/04/2021                MCV                      91.1                03/04/2021                PLT  146 (L)             03/04/2021          )        Anesthesia Quick Evaluation
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
## 2021-03-04 NOTE — Op Note (Signed)
[facility] Patient Name: Rodney Hartman Procedure Date : 03/04/2021 MRN: [id number] Attending MD: Jackquline Denmark , MD Date of Birth: 05-18-1960 CSN: [id number] Age: 61 Admit Type: Inpatient Procedure:                Upper GI endoscopy Indications:              Melena Providers:                Jackquline Denmark, MD, Grace Isaac, RN, William Dalton, Technician Referring MD:              Medicines:                Monitored Anesthesia Care Complications:            No immediate complications. Estimated Blood Loss:     Estimated blood loss: none. Procedure:                Pre-Anesthesia Assessment:                           - Prior to the procedure, a History and Physical                            was performed, and patient medications and                            allergies were reviewed. The patient's tolerance of                            previous anesthesia was also reviewed. The risks                            and benefits of the procedure and the sedation                            options and risks were discussed with the patient.                            All questions were answered, and informed consent                            was obtained. Prior Anticoagulants: The patient has                            taken no previous anticoagulant or antiplatelet                            agents. ASA Grade Assessment: II - A patient with                            mild systemic disease. After reviewing the risks                            and  benefits, the patient was deemed in                            satisfactory condition to undergo the procedure.                           After obtaining informed consent, the endoscope was                            passed under direct vision. Throughout the                            procedure, the patient's blood pressure, pulse, and                            oxygen saturations were monitored continuously.  The                            [id number] Olympus endoscope was introduced                            through the mouth, and advanced to the second part                            of duodenum. The upper GI endoscopy was                            accomplished without difficulty. The patient                            tolerated the procedure well. Scope In: Scope Out: Findings:      The examined esophagus was normal with well-defined Z-line at 40 cm.      Four non-bleeding cratered gastric ulcers, with clean whitish base, with       no stigmata of bleeding were found in the gastric antrum. The largest       lesion was 10 mm in largest dimension. There was surrounding mild       gastritis. Multiple biopsies were taken with a cold forceps for       histology for HP.      Localized mild inflammation characterized by erythema and friability was       found in the duodenal bulb. Second portion of the duodenum was normal. Impression:               - Non-bleeding gastric ulcers with no stigmata of                            bleeding. Biopsied.                           - Mild duodenitis. Recommendation:           - Soft diet. Advance as tolerated                           - Protonix 40mg  IV BID x 24hrs, the 40mg   po BID x 8                            weeks, then QD indefinitely                           - No aspirin, ibuprofen, naproxen, or other                            non-steroidal anti-inflammatory drugs.                           - Stop alcohol                           - Return to GI clinic in 4-6 weeks.                           - Recommend repeating EGD in 12 weeks to ensure                            healing. Consider colonoscopy at the same time.                           - Follow bx.                           - Trend CBC. If stable over the next 24 hours, can                            D/C home.                           - May benefit from iron supplementation at D/C.                            - The findings and recommendations were discussed                            with the patient. Discussed compliance.                           - Will sign off for now. Procedure Code(s):        --- Professional ---                           [phone], Esophagogastroduodenoscopy, flexible,                            transoral; with biopsy, single or multiple Diagnosis Code(s):        --- Professional ---                           K25.9, Gastric ulcer, unspecified as acute or  chronic, without hemorrhage or perforation                           K29.80, Duodenitis without bleeding                           K92.1, Melena (includes Hematochezia) CPT copyright 2019 American Medical Association. All rights reserved. The codes documented in this report are preliminary and upon coder review may  be revised to meet current compliance requirements. Jackquline Denmark, MD 03/04/2021 3:24:35 PM This report has been signed electronically. Number of Addenda: 0

## 2021-03-04 NOTE — Transfer of Care (Signed)
Immediate Anesthesia Transfer of Care Note  Patient: Rodney Hartman  Procedure(s) Performed: ESOPHAGOGASTRODUODENOSCOPY (EGD) WITH PROPOFOL BIOPSY  Patient Location: Endoscopy Unit  Anesthesia Type:MAC  Level of Consciousness: drowsy and patient cooperative  Airway & Oxygen Therapy: Patient Spontanous Breathing  Post-op Assessment: Report given to RN and Post -op Vital signs reviewed and stable  Post vital signs: Reviewed and stable  Last Vitals:  Vitals Value Taken Time  BP 132/71 03/04/21 1520  Temp 36.4 C 03/04/21 1520  Pulse 89 03/04/21 1520  Resp 20 03/04/21 1522  SpO2 95 % 03/04/21 1520  Vitals shown include unvalidated device data.  Last Pain:  Vitals:   03/04/21 1520  TempSrc: Temporal  PainSc: 0-No pain         Complications: No notable events documented.

## 2021-03-04 NOTE — Progress Notes (Signed)
°  Progress Note   Patient: Rodney Hartman [id number] DOB: 06/17/1960 DOA: 03/03/2021     1 DOS: the patient was seen and examined on 03/04/2021   Brief hospital course: 61 year old male with history of alcohol abuse Goody powder use, history of peptic ulcer admitted with syncope and black stools. He reports using ibuprofen for back pain.  Upon admission he was found to be hypotensive and tachycardic currently hemodynamically stable.  Assessment and Plan: No notes have been filed under this hospital service. Service: Hospitalist #1 syncope secondary to GI bleed likely upper-plan for EGD this morning.  Continue n.p.o. and IV PPI.  Appreciate GI input.  #2 AKI solved with IV fluids creatinine 0.95 from 1.40.  #3 severe hypokalemia likely decreased nutritional intake and alcohol abuse.  Replete aggressively.  Add magnesium to the labs drawn this morning.  #4 alcohol abuse CIWA protocol initiated.  Currently he is not in withdrawal.  Alcohol level was less than 10 on admission this time.  #5 history of essential hypertension blood pressure 117/98 monitor and continue IV fluids.  Continue as needed hydralazine.  He takes Zestril at home currently on hold since he is n.p.o and had AKI.  #6 anxiety and depression restart home meds when he is able to take p.o.  #7 mildly elevated troponin with no acute EKG findings likely demand ischemia.  Troponins 25 and 32 respectively.  Trend troponin .  EKG with Q waves in V4 and V5 otherwise no acute findings.     Subjective: He is resting in bed he denies any nausea vomiting or any further black stools since admission.  His potassium is very low at 2.7.  Hemoglobin this morning is 9.1 with platelets 146.  Physical Exam: Vitals:   03/03/21 2000 03/03/21 2130 03/04/21 0020 03/04/21 0511  BP: 101/71 (!) 141/82 102/62 (!) 117/98  Pulse: 86 98 90 87  Resp: 15 16 15 18   Temp:  99.2 F (37.3 C) 99 F (37.2 C) 98.5 F (36.9 C)  TempSrc:  Oral Oral    SpO2: 100% 100% 100%   Weight:      Height:       Patient resting in bed in no acute distress His oral mucosa is dry Neck is supple Chest is clear to auscultation Cardiac regular rate and rhythm occasional tachy Abdomen soft nontender with good bowel sounds Extremities no edema  Data Reviewed: EKG reviewed by me with U waves in lateral leads otherwise no acute ischemic changes.  K2.7.  Hemoglobin 9.1.  Platelets 146.  Family Communication: None at bedside  Disposition: Inpatient Status is: Inpatient Remains inpatient appropriate because: gi bleed   Planned Discharge Destination: Home  DVT prophylaxis SCD due to GI bleed Time spent: 40 minutes  Author: , MD 03/04/2021 9:48 AM  For on call review [email or web address].

## 2021-03-05 DIAGNOSIS — K254 Chronic or unspecified gastric ulcer with hemorrhage: Secondary | ICD-10-CM

## 2021-03-05 LAB — HEMOGLOBIN AND HEMATOCRIT, BLOOD
HCT: 27 % — ABNORMAL LOW (ref 39.0–52.0)
HCT: 26.1 % — ABNORMAL LOW (ref 39.0–52.0)
Hemoglobin: 9 g/dL — ABNORMAL LOW (ref 13.0–17.0)
Hemoglobin: 8.8 g/dL — ABNORMAL LOW (ref 13.0–17.0)

## 2021-03-05 MED ORDER — PANTOPRAZOLE SODIUM 40 MG PO TBEC: 40.0000 mg | DELAYED_RELEASE_TABLET | Freq: Two times a day (BID) | ORAL | 1 refills | Status: AC

## 2021-03-05 MED ORDER — PANTOPRAZOLE SODIUM 40 MG PO TBEC
40.0000 mg | DELAYED_RELEASE_TABLET | Freq: Once | ORAL | Status: AC
  Administered 2021-03-05: 40 mg via ORAL
  Filled 2021-03-05: qty 1

## 2021-03-05 MED ORDER — THIAMINE HCL 100 MG PO TABS: 100.0000 mg | ORAL_TABLET | Freq: Every day | ORAL | Status: AC

## 2021-03-05 MED ORDER — ADULT MULTIVITAMIN W/MINERALS CH: 1.0000 | ORAL_TABLET | Freq: Every day | ORAL | Status: AC

## 2021-03-05 MED ORDER — FOLIC ACID 1 MG PO TABS: 1.0000 mg | ORAL_TABLET | Freq: Every day | ORAL | Status: AC

## 2021-03-05 NOTE — Discharge Instructions (Signed)
Do not take aspirin NSAID Motrin ibuprofen Stop consuming alcohol Take Protonix 40 mg twice a day for 8 weeks and then 40 mg daily for life Follow-up with PCP and GI doctors

## 2021-03-05 NOTE — TOC CM/SW Note (Signed)
Bus pass provided for transport home   [place], [place] Transition of Care [phone]

## 2021-03-05 NOTE — Progress Notes (Signed)
Pt discharged home in stable condition. Discharge instructions given. Script sent to pharmacy of choice. No immediate questions or concerns at this time.  

## 2021-03-05 NOTE — Discharge Summary (Signed)
Physician Discharge Summary   Patient: Rodney Hartman MRN: [id number] DOB: 11/13/1960  Admit date:     03/03/2021  Discharge date: 03/05/21  Discharge Physician: Alwyn Ren   PCP: Lavinia Sharps, NP   Recommendations at discharge: Follow-up with GI for biopsy results.   Follow-up with PCP. Abstain from alcohol completely. I have stopped Zestril follow-up with PCP to see if this needs to be restarted   Discharge Diagnoses: Principal Problem:   GI bleed Active Problems:   Syncope, vasovagal  Resolved Problems:   * No resolved hospital problems. * #1 syncope secondary to GI bleed likely upper-plan for EGD this morning.  Continue n.p.o. and IV PPI.  EGD 4 nonbleeding cratered gastric ulcers with clean whitish base with no stigmata of bleeding were noted in the gastric antrum.  Largest lesion was 10 mm in largest dimension.  Surrounding mild gastritis.  Multiple biopsies taken for Helicobacter pylori. He was discharged on Protonix 40 mg twice a day for 8 weeks then 40 mg daily indefinitely and follow-up with GI for repeat EGD in 12 weeks.   #2 AKI solved with IV fluids creatinine 0.95 from 1.40.   #3 severe hypokalemia resolved with aggressive replacements.  #4 alcohol abuse discussed abstinence and importance of abstinence.   #5 history of essential hypertension his blood pressure was soft I have not put him back on Zestril that he was taking at home on discharge.  He will need to follow-up with his PCP and restart Sustol if his blood pressure warrants taking it.    #6 anxiety and depression continue home meds   #7 mildly elevated troponin with no acute EKG findings likely demand ischemia.  Troponins 25 and 32 respectively.  Trend troponin .  EKG with Q waves in V4 and V5 otherwise no acute findings.  Hospital Course: 61 year old male with history of alcohol abuse Goody powder use, history of peptic ulcer admitted with syncope and black stools. He reports using ibuprofen  for back pain.  Upon admission he was found to be hypotensive and tachycardic and was seen by Dr. Chales Abrahams who did an EGD that showed nonbleeding gastric ulcers with no stigmata of bleeding.  These were biopsied.  Mild duodenitis.  He was recommended to continue Protonix 40 mg twice a day for 8 weeks and then daily indefinitely.  No aspirin ibuprofen naproxen or other NSAIDs.  Stop alcohol completely.  Follow-up with GI in 4 to 6 weeks.  Recommend EGD in 12 weeks to ensure healing.  And to consider colonoscopy at the same time.     Assessment and Plan: See above         Consultants: GI Procedures performed: EGD Disposition: Home Diet recommendation:  Discharge Diet Orders (From admission, onward)     Start     Ordered   03/05/21 0000  Diet - low sodium heart healthy        03/05/21 0932           Cardiac diet  DISCHARGE MEDICATION: Allergies as of 03/05/2021   No Known Allergies      Medication List     STOP taking these medications    doxycycline 100 MG capsule Commonly known as: VIBRAMYCIN   lisinopril 40 MG tablet Commonly known as: ZESTRIL   omeprazole 20 MG capsule Commonly known as: PRILOSEC       TAKE these medications    folic acid 1 MG tablet Commonly known as: FOLVITE Take 1 tablet (1 mg total)  by mouth daily. Start taking on: March 06, 2021   multivitamin with minerals Tabs tablet Take 1 tablet by mouth daily. Start taking on: March 06, 2021   pantoprazole 40 MG tablet Commonly known as: Protonix Take 1 tablet (40 mg total) by mouth 2 (two) times daily.   thiamine 100 MG tablet Take 1 tablet (100 mg total) by mouth daily. Start taking on: March 06, 2021   traZODone 150 MG tablet Commonly known as: DESYREL Take 150 mg by mouth at bedtime.        Follow-up Information     Placey, Chales AbrahamsMary Ann, NP Follow up.   Contact information: [address] [place] [address] [phone]         Lynann BolognaGupta, Rajesh, MD Follow  up.   Specialties: Gastroenterology, Internal Medicine Contact information: [address] [place] [phone]                 Discharge Exam: Ceasar MonsFiled Weights   03/03/21 1548  Weight: 79.8 kg     Condition at discharge: Stable  The results of significant diagnostics from this hospitalization (including imaging, microbiology, ancillary and laboratory) are listed below for reference.   Imaging Studies: DG Chest 2 View  Result Date: 03/03/2021 CLINICAL DATA:  trauma EXAM: CHEST - 2 VIEW COMPARISON:  None. FINDINGS: The cardiomediastinal silhouette is within normal limits. There is scarring at the left lung base, similar to prior exam. No new airspace disease. No pneumothorax. No acute osseous abnormality. IMPRESSION: Scarring left lateral lung base, similar to prior exam. No new airspace disease or radiographically evident acute fracture. Electronically Signed   By: Caprice RenshawJacob  Kahn M.D.   On: 03/03/2021 16:59   CT Head Wo Contrast  Result Date: 03/03/2021 CLINICAL DATA:  Syncopal episode, dangerous mechanism of injury, fell, neck trauma, head trauma, abnormal mental status EXAM: CT HEAD WITHOUT CONTRAST CT CERVICAL SPINE WITHOUT CONTRAST TECHNIQUE: Multidetector CT imaging of the head and cervical spine was performed following the standard protocol without intravenous contrast. Multiplanar CT image reconstructions of the cervical spine were also generated. RADIATION DOSE REDUCTION: This exam was performed according to the departmental dose-optimization program which includes automated exposure control, adjustment of the mA and/or kV according to patient size and/or use of iterative reconstruction technique. COMPARISON:  CT head 12/30/2020, CT cervical spine 01/18/2020 FINDINGS: CT HEAD FINDINGS Brain: Generalized atrophy. Normal ventricular morphology. No midline shift or mass effect. Otherwise normal appearance of brain parenchyma. No intracranial  hemorrhage, mass lesion, or evidence of acute infarction. No extra-axial fluid collections. Vascular: No hyperdense vessels Skull: Intact Sinuses/Orbits: Clear Other: N/A CT CERVICAL SPINE FINDINGS Alignment: Minimal anterolisthesis at C7-T1 and retrolisthesis at C6-C7, unchanged. Remaining alignments normal. Skull base and vertebrae: Vertebral body heights maintained. Osseous mineralization normal. Cerclage wires present at posterior elements of C3-C5 with bony facet fusion. Multilevel facet degenerative changes. Multilevel degenerative disc disease changes with disc space narrowing and endplate spur formation. Visualized skull base intact. No fracture, subluxation, or bone destruction. Soft tissues and spinal canal: Prevertebral soft tissues normal thickness. Disc levels:  No specific abnormalities Upper chest: Tips of lung apices clear. Other: N/A IMPRESSION: Generalized atrophy. No acute intracranial abnormalities. Multilevel degenerative disc and facet disease changes of the cervical spine. Prior posterior fusion C3-C5. No acute cervical spine abnormalities. Electronically Signed   By: Ulyses SouthwardMark  Boles M.D.   On: 03/03/2021 16:42   CT Cervical Spine Wo Contrast  Result Date: 03/03/2021 CLINICAL DATA:  Syncopal  episode, dangerous mechanism of injury, fell, neck trauma, head trauma, abnormal mental status EXAM: CT HEAD WITHOUT CONTRAST CT CERVICAL SPINE WITHOUT CONTRAST TECHNIQUE: Multidetector CT imaging of the head and cervical spine was performed following the standard protocol without intravenous contrast. Multiplanar CT image reconstructions of the cervical spine were also generated. RADIATION DOSE REDUCTION: This exam was performed according to the departmental dose-optimization program which includes automated exposure control, adjustment of the mA and/or kV according to patient size and/or use of iterative reconstruction technique. COMPARISON:  CT head 12/30/2020, CT cervical spine 01/18/2020 FINDINGS: CT  HEAD FINDINGS Brain: Generalized atrophy. Normal ventricular morphology. No midline shift or mass effect. Otherwise normal appearance of brain parenchyma. No intracranial hemorrhage, mass lesion, or evidence of acute infarction. No extra-axial fluid collections. Vascular: No hyperdense vessels Skull: Intact Sinuses/Orbits: Clear Other: N/A CT CERVICAL SPINE FINDINGS Alignment: Minimal anterolisthesis at C7-T1 and retrolisthesis at C6-C7, unchanged. Remaining alignments normal. Skull base and vertebrae: Vertebral body heights maintained. Osseous mineralization normal. Cerclage wires present at posterior elements of C3-C5 with bony facet fusion. Multilevel facet degenerative changes. Multilevel degenerative disc disease changes with disc space narrowing and endplate spur formation. Visualized skull base intact. No fracture, subluxation, or bone destruction. Soft tissues and spinal canal: Prevertebral soft tissues normal thickness. Disc levels:  No specific abnormalities Upper chest: Tips of lung apices clear. Other: N/A IMPRESSION: Generalized atrophy. No acute intracranial abnormalities. Multilevel degenerative disc and facet disease changes of the cervical spine. Prior posterior fusion C3-C5. No acute cervical spine abnormalities. Electronically Signed   By: Ulyses Southward M.D.   On: 03/03/2021 16:42   US Abdomen Limited RUQ (LIVER/GB)  Result Date: 03/03/2021 CLINICAL DATA:  Elevated liver function tests EXAM: ULTRASOUND ABDOMEN LIMITED RIGHT UPPER QUADRANT COMPARISON:  None. FINDINGS: Gallbladder: No gallstones or wall thickening visualized. No sonographic Murphy sign noted by sonographer. Common bile duct: Diameter: 6 mm in proximal diameter Liver: Hepatic parenchymal echogenicity is diffusely increased and there is coarsening of the hepatic echotexture in keeping with mild hepatic steatosis. No focal intrahepatic masses are seen and there is no intrahepatic biliary ductal dilation. Portal vein is patent on  color Doppler imaging with normal direction of blood flow towards the liver. Other: None. IMPRESSION: Hepatic steatosis Electronically Signed   By: Helyn Numbers M.D.   On: 03/03/2021 19:53    Microbiology: Results for orders placed or performed during the hospital encounter of 03/03/21  Resp Panel by RT-PCR (Flu A&B, Covid) Nasopharyngeal Swab     Status: None   Collection Time: 03/03/21  5:43 PM   Specimen: Nasopharyngeal Swab; Nasopharyngeal(NP) swabs in vial transport medium  Result Value Ref Range Status   SARS Coronavirus 2 by RT PCR NEGATIVE NEGATIVE Final    Comment: (NOTE) SARS-CoV-2 target nucleic acids are NOT DETECTED.  The SARS-CoV-2 RNA is generally detectable in upper respiratory specimens during the acute phase of infection. The lowest concentration of SARS-CoV-2 viral copies this assay can detect is 138 copies/mL. A negative result does not preclude SARS-Cov-2 infection and should not be used as the sole basis for treatment or other patient management decisions. A negative result may occur with  improper specimen collection/handling, submission of specimen other than nasopharyngeal swab, presence of viral mutation(s) within the areas targeted by this assay, and inadequate number of viral copies(<138 copies/mL). A negative result must be combined with clinical observations, patient history, and epidemiological information. The expected result is Negative.  Fact Sheet for Patients:  BloggerCourse.com  Fact Sheet for  Healthcare Providers:  SeriousBroker.it  This test is no t yet approved or cleared by the [place] and  has been authorized for detection and/or diagnosis of SARS-CoV-2 by FDA under an Emergency Use Authorization (EUA). This EUA will remain  in effect (meaning this test can be used) for the duration of the COVID-19 declaration under Section 564(b)(1) of the Act, 21 U.S.C.section 360bbb-3(b)(1),  unless the authorization is terminated  or revoked sooner.       Influenza A by PCR NEGATIVE NEGATIVE Final   Influenza B by PCR NEGATIVE NEGATIVE Final    Comment: (NOTE) The Xpert Xpress SARS-CoV-2/FLU/RSV plus assay is intended as an aid in the diagnosis of influenza from Nasopharyngeal swab specimens and should not be used as a sole basis for treatment. Nasal washings and aspirates are unacceptable for Xpert Xpress SARS-CoV-2/FLU/RSV testing.  Fact Sheet for Patients: BloggerCourse.com  Fact Sheet for Healthcare Providers: SeriousBroker.it  This test is not yet approved or cleared by the [place] FDA and has been authorized for detection and/or diagnosis of SARS-CoV-2 by FDA under an Emergency Use Authorization (EUA). This EUA will remain in effect (meaning this test can be used) for the duration of the COVID-19 declaration under Section 564(b)(1) of the Act, 21 U.S.C. section 360bbb-3(b)(1), unless the authorization is terminated or revoked.  Performed at [facility], [address] [place], [place] [address]     Labs: CBC: Recent Labs  Lab 03/03/21 1622 03/03/21 2148 03/04/21 0233 03/04/21 1120 03/04/21 1651 03/05/21 0036 03/05/21 0934  WBC 9.2  --  5.4  --   --   --   --   NEUTROABS 7.7  --   --   --   --   --   --   HGB 11.3*   < > 9.1* 9.5* 9.7* 9.0* 8.8*  HCT 35.0*   < > 27.5* 28.0* 28.9* 27.0* 26.1*  MCV 94.3  --  91.1  --   --   --   --   PLT 189  --  146*  --   --   --   --    < > = values in this interval not displayed.   Basic Metabolic Panel: Recent Labs  Lab 03/03/21 1622 03/04/21 0233 03/04/21 1120 03/04/21 1300  NA 134* 135  --   --   K 3.9 2.7*  --  3.5  CL 101 106  --   --   CO2 19* 19*  --   --   GLUCOSE 91 100*  --   --   BUN 36* 27*  --   --   CREATININE 1.40* 0.95  --   --   CALCIUM 9.2 8.6*  --   --   MG  --   --  2.2  --    Liver Function Tests: Recent Labs   Lab 03/03/21 1622  AST 52*  ALT 29  ALKPHOS 81  BILITOT 2.2*  PROT 6.7  ALBUMIN 3.4*   CBG: No results for input(s): GLUCAP in the last 168 hours.  Discharge time spent: greater than 30 minutes.  Signed: Alwyn Ren, MD [facility] 03/05/2021

## 2021-03-05 NOTE — Plan of Care (Signed)

## 2021-03-06 ENCOUNTER — Encounter (HOSPITAL_COMMUNITY): Payer: Self-pay | Admitting: Gastroenterology

## 2021-03-07 LAB — SURGICAL PATHOLOGY

## 2021-03-12 ENCOUNTER — Encounter: Payer: Self-pay | Admitting: Gastroenterology

## 2021-03-12 NOTE — Progress Notes (Signed)
Rodney Hartman, can you let the pt know that gastric bx are + for H pylori. Would recommend the following treatment regimen for 14 days: Amoxicillin 1gm BID Clarithromycin 500mg  BID Flagyl 500mg  BID Omeprazole 20 BID   Can you please order this if no signfiicant interactions noted. Once done with therapy, the patient should wait one month and perform a stool study for H pylori antigen to ensure negative. The PPI needs to be held at least 2 weeks prior to submitting the stool sample. The patient should avoid alcohol while taking Flagyl.    If we have any samples of H. pylori treatment like Pylera, please let me know  Thanks  Dr 

## 2021-03-13 ENCOUNTER — Other Ambulatory Visit: Payer: Self-pay

## 2021-03-13 DIAGNOSIS — K2970 Gastritis, unspecified, without bleeding: Secondary | ICD-10-CM

## 2021-03-13 DIAGNOSIS — B9681 Helicobacter pylori [H. pylori] as the cause of diseases classified elsewhere: Secondary | ICD-10-CM

## 2021-03-13 MED ORDER — OMEPRAZOLE 20 MG PO CPDR: 20.0000 mg | DELAYED_RELEASE_CAPSULE | Freq: Two times a day (BID) | ORAL | 0 refills | Status: AC

## 2021-03-13 MED ORDER — CLARITHROMYCIN 500 MG PO TABS: 500.0000 mg | ORAL_TABLET | Freq: Two times a day (BID) | ORAL | 0 refills | Status: AC

## 2021-03-13 MED ORDER — AMOXICILLIN 500 MG PO CAPS: 1000.0000 mg | ORAL_CAPSULE | Freq: Two times a day (BID) | ORAL | 0 refills | Status: AC

## 2021-03-13 MED ORDER — METRONIDAZOLE 500 MG PO TABS: 500.0000 mg | ORAL_TABLET | Freq: Two times a day (BID) | ORAL | 0 refills | Status: AC

## 2021-03-24 ENCOUNTER — Emergency Department (HOSPITAL_COMMUNITY): Payer: Self-pay

## 2021-03-24 ENCOUNTER — Other Ambulatory Visit: Payer: Self-pay

## 2021-03-24 ENCOUNTER — Encounter (HOSPITAL_COMMUNITY): Payer: Self-pay | Admitting: Emergency Medicine

## 2021-03-24 ENCOUNTER — Emergency Department (HOSPITAL_COMMUNITY)
Admission: EM | Admit: 2021-03-24 | Discharge: 2021-03-24 | Disposition: A | Discharge status: Home / Self Care | Payer: Self-pay | Attending: Emergency Medicine | Admitting: Emergency Medicine

## 2021-03-24 DIAGNOSIS — E871 Hypo-osmolality and hyponatremia: Secondary | ICD-10-CM | POA: Insufficient documentation

## 2021-03-24 DIAGNOSIS — R17 Unspecified jaundice: Secondary | ICD-10-CM

## 2021-03-24 DIAGNOSIS — R569 Unspecified convulsions: Secondary | ICD-10-CM | POA: Insufficient documentation

## 2021-03-24 DIAGNOSIS — D649 Anemia, unspecified: Secondary | ICD-10-CM | POA: Insufficient documentation

## 2021-03-24 DIAGNOSIS — R748 Abnormal levels of other serum enzymes: Secondary | ICD-10-CM | POA: Insufficient documentation

## 2021-03-24 LAB — COMPREHENSIVE METABOLIC PANEL
ALT: 18 U/L (ref 0–44)
AST: 41 U/L (ref 15–41)
Albumin: 3.2 g/dL — ABNORMAL LOW (ref 3.5–5.0)
Alkaline Phosphatase: 146 U/L — ABNORMAL HIGH (ref 38–126)
Anion gap: 12 (ref 5–15)
BUN: 12 mg/dL (ref 6–20)
CO2: 22 mmol/L (ref 22–32)
Calcium: 8.7 mg/dL — ABNORMAL LOW (ref 8.9–10.3)
Chloride: 99 mmol/L (ref 98–111)
Creatinine, Ser: 0.99 mg/dL (ref 0.61–1.24)
GFR, Estimated: >60 mL/min (ref >60)
Glucose, Bld: 97 mg/dL (ref 70–99)
Potassium: 3.5 mmol/L (ref 3.5–5.1)
Sodium: 133 mmol/L — ABNORMAL LOW (ref 135–145)
Total Bilirubin: 1.8 mg/dL — ABNORMAL HIGH (ref 0.3–1.2)
Total Protein: 6.4 g/dL — ABNORMAL LOW (ref 6.5–8.1)

## 2021-03-24 LAB — CBC WITH DIFFERENTIAL/PLATELET
Abs Immature Granulocytes: 0.02 10*3/uL (ref 0.00–0.07)
Basophils Absolute: 0 10*3/uL (ref 0.0–0.1)
Basophils Relative: 0 %
Eosinophils Absolute: 0.1 10*3/uL (ref 0.0–0.5)
Eosinophils Relative: 2 %
HCT: 31.5 % — ABNORMAL LOW (ref 39.0–52.0)
Hemoglobin: 10.4 g/dL — ABNORMAL LOW (ref 13.0–17.0)
Immature Granulocytes: 0 %
Lymphocytes Relative: 12 %
Lymphs Abs: 0.8 10*3/uL (ref 0.7–4.0)
MCH: 31 pg (ref 26.0–34.0)
MCHC: 33 g/dL (ref 30.0–36.0)
MCV: 94 fL (ref 80.0–100.0)
Monocytes Absolute: 0.6 10*3/uL (ref 0.1–1.0)
Monocytes Relative: 10 %
Neutro Abs: 5.1 10*3/uL (ref 1.7–7.7)
Neutrophils Relative %: 76 %
Platelets: 254 10*3/uL (ref 150–400)
RBC: 3.35 MIL/uL — ABNORMAL LOW (ref 4.22–5.81)
RDW: 16.6 % — ABNORMAL HIGH (ref 11.5–15.5)
WBC: 6.7 10*3/uL (ref 4.0–10.5)
nRBC: 0 % (ref 0.0–0.2)

## 2021-03-24 LAB — ETHANOL: Alcohol, Ethyl (B): <10 mg/dL (ref <10)

## 2021-03-24 MED ORDER — LEVETIRACETAM IN NACL 1000 MG/100ML IV SOLN
1000.0000 mg | Freq: Once | INTRAVENOUS | Status: AC
  Administered 2021-03-24: 1000 mg via INTRAVENOUS
  Filled 2021-03-24: qty 100

## 2021-03-24 MED ORDER — LORAZEPAM 2 MG/ML IJ SOLN
1.0000 mg | Freq: Once | INTRAMUSCULAR | Status: AC
  Administered 2021-03-24: 1 mg via INTRAVENOUS
  Filled 2021-03-24: qty 1

## 2021-03-24 MED ORDER — LEVETIRACETAM 500 MG PO TABS: 500.0000 mg | ORAL_TABLET | Freq: Two times a day (BID) | ORAL | 0 refills | Status: AC

## 2021-03-24 NOTE — Discharge Instructions (Addendum)
You had 2 seizures today.  The law in [place] states that you may not drive a car if you have had a seizure in the past 6 months. ? ?Please follow-up with the neurologist.  There is an additional test that needs to be done to evaluate you for your seizures. ? ?Make sure you take the medication every day, or you may have additional seizures. ?

## 2021-03-24 NOTE — ED Notes (Signed)
Pt found running out of room and running into other rooms. Pt not responding to questions asked. Pt started to seize. Pt guided to the floor by this RN with assistance by another RN. Seizure last about 45 seconds. Pt did not hit his head on the floor. Pt placed back in bed. Pt currently post ictal. EDP called to bedside.  ?

## 2021-03-24 NOTE — ED Triage Notes (Signed)
Pt BIB EMS from home with c/o 5 minute seizure, has no hx of seizures. Pt's friend found him on the porch shaking. Pt was post ictal upon EMS arrival and was A&O x 2. Pt A&O x4 here. Pt previously seen here for alcohol intoxication in the past, state that he has not had any alcohol since Monday. Denies withdrawal. Denies pain. ?

## 2021-03-24 NOTE — ED Provider Notes (Signed)
[facility] EMERGENCY DEPARTMENT Provider Note   CSN: [id number] Arrival date & time: 03/24/21  4098     History  Chief Complaint  Patient presents with   Seizures    Rodney Hartman is a 61 y.o. male.  The history is provided by the patient.  Seizures He has history of hypertension, alcohol abuse and is brought in by ambulance after reported to have had a seizure at home.  Patient has no memory of this incident.  There was no bit lip or tongue, no bowel or bladder incontinence.  He denies prior history of seizures.  He tells me he has not had any ethanol in about a month, told triage RN that he has not had any ethanol in the last 3 days.  He denies drug use.   Home Medications Prior to Admission medications   Medication Sig Start Date End Date Taking? Authorizing Provider  amoxicillin (AMOXIL) 500 MG capsule Take 2 capsules (1,000 mg total) by mouth 2 (two) times daily for 14 days. 03/13/21 03/27/21  Lynann Bologna, MD  clarithromycin (BIAXIN) 500 MG tablet Take 1 tablet (500 mg total) by mouth 2 (two) times daily for 14 days. 03/13/21 03/27/21  Lynann Bologna, MD  folic acid (FOLVITE) 1 MG tablet Take 1 tablet (1 mg total) by mouth daily. 03/06/21   Alwyn Ren, MD  metroNIDAZOLE (FLAGYL) 500 MG tablet Take 1 tablet (500 mg total) by mouth 2 (two) times daily for 14 days. 03/13/21 03/27/21  Lynann Bologna, MD  Multiple Vitamin (MULTIVITAMIN WITH MINERALS) TABS tablet Take 1 tablet by mouth daily. 03/06/21   Alwyn Ren, MD  omeprazole (PRILOSEC) 20 MG capsule Take 1 capsule (20 mg total) by mouth 2 (two) times daily for 14 days. 03/13/21 03/27/21  Lynann Bologna, MD  pantoprazole (PROTONIX) 40 MG tablet Take 1 tablet (40 mg total) by mouth 2 (two) times daily. 03/05/21 03/05/22  Alwyn Ren, MD  thiamine 100 MG tablet Take 1 tablet (100 mg total) by mouth daily. 03/06/21   Alwyn Ren, MD  traZODone (DESYREL) 150 MG tablet Take 150 mg by mouth at  bedtime. 10/05/19   [provider]      Allergies    Patient has no known allergies.    Review of Systems   Review of Systems  Neurological:  Positive for seizures.  All other systems reviewed and are negative.  Physical Exam Updated Vital Signs BP (!) 111/96   Pulse (!) 102   Temp 98.7 F (37.1 C)   Resp (!) 21   Ht 5' 10.5" (1.791 m)   Wt 79.8 kg   SpO2 100%   BMI 24.89 kg/m  Physical Exam Vitals and nursing note reviewed.  61 year old male, resting comfortably and in no acute distress. Vital signs are significant for mildly elevated diastolic blood pressure, borderline elevated heart rate and borderline elevated respiratory rate. Oxygen saturation is 100%, which is normal. Head is normocephalic and atraumatic. PERRLA, EOMI. Oropharynx is clear. Neck is nontender and supple without adenopathy or JVD. Back is nontender and there is no CVA tenderness. Lungs are clear without rales, wheezes, or rhonchi. Chest is nontender. Heart has regular rate and rhythm without murmur. Abdomen is soft, flat, nontender. Extremities have no cyanosis or edema, full range of motion is present. Skin is warm and dry without rash. Neurologic: Mental status is normal, cranial nerves are intact, strength is 5/5 in all 4 extremities.  He is showing intermittent  generalized myoclonic jerks.  ED Results / Procedures / Treatments   Labs (all labs ordered are listed, but only abnormal results are displayed) Labs Reviewed  COMPREHENSIVE METABOLIC PANEL - Abnormal; Notable for the following components:      Result Value   Sodium 133 (*)    Calcium 8.7 (*)    Total Protein 6.4 (*)    Albumin 3.2 (*)    Alkaline Phosphatase 146 (*)    Total Bilirubin 1.8 (*)    All other components within normal limits  CBC WITH DIFFERENTIAL/PLATELET - Abnormal; Notable for the following components:   RBC 3.35 (*)    Hemoglobin 10.4 (*)    HCT 31.5 (*)    RDW 16.6 (*)    All other components within  normal limits  ETHANOL  RAPID URINE DRUG SCREEN, HOSP PERFORMED    EKG EKG Interpretation  Date/Time:  Friday March 24 2021 02:33:12 EDT Ventricular Rate:  95 PR Interval:  230 QRS Duration: 84 QT Interval:  383 QTC Calculation: 482 R Axis:   40 Text Interpretation: Sinus rhythm Prolonged PR interval Borderline low voltage, extremity leads Probable anteroseptal infarct, old When compared with ECG of 03/03/2021, No significant change was found Confirmed by Dione Booze ([id number]) on 03/24/2021 3:41:00 AM  Radiology CT Head Wo Contrast  Result Date: 03/24/2021 CLINICAL DATA:  61 year old male with 1st seizure. EXAM: CT HEAD WITHOUT CONTRAST TECHNIQUE: Contiguous axial images were obtained from the base of the skull through the vertex without intravenous contrast. RADIATION DOSE REDUCTION: This exam was performed according to the departmental dose-optimization program which includes automated exposure control, adjustment of the mA and/or kV according to patient size and/or use of iterative reconstruction technique. COMPARISON:  Head CT 03/03/2021. FINDINGS: Brain: Scaphocephaly. Cerebral volume is stable and within normal limits for age. No midline shift, ventriculomegaly, mass effect, evidence of mass lesion, intracranial hemorrhage or evidence of cortically based acute infarction. Gray-white matter differentiation is within normal limits throughout the brain. No encephalomalacia identified. Vascular: No suspicious intracranial vascular hyperdensity. Skull: Chronic bilateral maxilla, nasal bone, lamina papyracea fractures appear stable from last month. No acute osseous abnormality identified. Sinuses/Orbits: Visualized paranasal sinuses and mastoids are stable and well aerated. Other: No acute orbit or scalp soft tissue injury identified. IMPRESSION: 1. Stable and normal for age non contrast CT appearance of the brain. 2. Chronic bilateral facial fractures. Electronically Signed   By: Odessa Fleming M.D.   On:  03/24/2021 04:51    Procedures Procedures  Cardiac monitor shows normal sinus rhythm, per my interpretation.  Medications Ordered in ED Medications  LORazepam (ATIVAN) injection 1 mg (1 mg Intravenous Given 03/24/21 0344)  levETIRAcetam (KEPPRA) IVPB 1000 mg/100 mL premix (0 mg Intravenous Stopped 03/24/21 0404)    ED Course/ Medical Decision Making/ A&P                           Medical Decision Making Amount and/or Complexity of Data Reviewed Labs: ordered. Radiology: ordered.  Risk Prescription drug management.   New onset seizure.  While in the ED, patient had a second seizure, so we will start on anticonvulsant therapy.  He is given a dose of lorazepam and loading dose of levetiracetam.  ECG shows nonspecific T wave changes, not significantly changed from prior.  Old records are reviewed, and he does have ED visits for syncope and alcohol intoxication  CT shows no acute process.  I have independently viewed the images, and  agree with the radiologist's interpretation.  Labs show mild hyponatremia which is not felt to be clinically significant.  Anemia is present which is stable compared with baseline.  Also, chronic elevation of total bilirubin is unchanged.  There is mild elevation of alkaline phosphatase.  None of this is felt to be clinically significant.  He is discharged with a prescription for levetiracetam and is referred to neurology for further outpatient work-up and treatment.  CRITICAL CARE Performed by: Dione Booze Total critical care time: 40 minutes Critical care time was exclusive of separately billable procedures and treating other patients. Critical care was necessary to treat or prevent imminent or life-threatening deterioration. Critical care was time spent personally by me on the following activities: development of treatment plan with patient and/or surrogate as well as nursing, discussions with consultants, evaluation of patient's response to treatment,  examination of patient, obtaining history from patient or surrogate, ordering and performing treatments and interventions, ordering and review of laboratory studies, ordering and review of radiographic studies, pulse oximetry and re-evaluation of patient's condition.       Final Clinical Impression(s) / ED Diagnoses Final diagnoses:  Seizure (HCC)  Normochromic normocytic anemia  Hyponatremia  Elevated alkaline phosphatase level  Serum total bilirubin elevated    Rx / DC Orders ED Discharge Orders          Ordered    levETIRAcetam (KEPPRA) 500 MG tablet  2 times daily        03/24/21 0740    Ambulatory referral to Neurology       Comments: An appointment is requested in approximately: 1 week   03/24/21 0740              Dione Booze, MD 03/24/21 [phone]

## 2021-04-03 ENCOUNTER — Emergency Department (HOSPITAL_COMMUNITY)
Admission: EM | Admit: 2021-04-03 | Discharge: 2021-04-03 | Disposition: A | Discharge status: Home / Self Care | Payer: Self-pay | Attending: Emergency Medicine | Admitting: Emergency Medicine

## 2021-04-03 ENCOUNTER — Encounter (HOSPITAL_COMMUNITY): Payer: Self-pay

## 2021-04-03 ENCOUNTER — Other Ambulatory Visit: Payer: Self-pay

## 2021-04-03 DIAGNOSIS — S0083XA Contusion of other part of head, initial encounter: Secondary | ICD-10-CM | POA: Insufficient documentation

## 2021-04-03 DIAGNOSIS — W108XXA Fall (on) (from) other stairs and steps, initial encounter: Secondary | ICD-10-CM | POA: Insufficient documentation

## 2021-04-03 DIAGNOSIS — Y92009 Unspecified place in unspecified non-institutional (private) residence as the place of occurrence of the external cause: Secondary | ICD-10-CM | POA: Insufficient documentation

## 2021-04-03 DIAGNOSIS — R251 Tremor, unspecified: Secondary | ICD-10-CM | POA: Insufficient documentation

## 2021-04-03 DIAGNOSIS — R569 Unspecified convulsions: Secondary | ICD-10-CM | POA: Insufficient documentation

## 2021-04-03 DIAGNOSIS — Y9301 Activity, walking, marching and hiking: Secondary | ICD-10-CM | POA: Insufficient documentation

## 2021-04-03 HISTORY — DX: Unspecified convulsions: R56.9

## 2021-04-03 LAB — CBC WITH DIFFERENTIAL/PLATELET
Abs Immature Granulocytes: 0.01 10*3/uL (ref 0.00–0.07)
Basophils Absolute: 0 10*3/uL (ref 0.0–0.1)
Basophils Relative: 1 %
Eosinophils Absolute: 0.1 10*3/uL (ref 0.0–0.5)
Eosinophils Relative: 2 %
HCT: 36.9 % — ABNORMAL LOW (ref 39.0–52.0)
Hemoglobin: 12.1 g/dL — ABNORMAL LOW (ref 13.0–17.0)
Immature Granulocytes: 0 %
Lymphocytes Relative: 7 %
Lymphs Abs: 0.4 10*3/uL — ABNORMAL LOW (ref 0.7–4.0)
MCH: 30 pg (ref 26.0–34.0)
MCHC: 32.8 g/dL (ref 30.0–36.0)
MCV: 91.3 fL (ref 80.0–100.0)
Monocytes Absolute: 0.2 10*3/uL (ref 0.1–1.0)
Monocytes Relative: 4 %
Neutro Abs: 5.2 10*3/uL (ref 1.7–7.7)
Neutrophils Relative %: 86 %
Platelets: 418 10*3/uL — ABNORMAL HIGH (ref 150–400)
RBC: 4.04 MIL/uL — ABNORMAL LOW (ref 4.22–5.81)
RDW: 16 % — ABNORMAL HIGH (ref 11.5–15.5)
WBC: 6 10*3/uL (ref 4.0–10.5)
nRBC: 0 % (ref 0.0–0.2)

## 2021-04-03 LAB — BASIC METABOLIC PANEL
Anion gap: 15 (ref 5–15)
BUN: 8 mg/dL (ref 6–20)
CO2: 20 mmol/L — ABNORMAL LOW (ref 22–32)
Calcium: 8.7 mg/dL — ABNORMAL LOW (ref 8.9–10.3)
Chloride: 100 mmol/L (ref 98–111)
Creatinine, Ser: 0.89 mg/dL (ref 0.61–1.24)
GFR, Estimated: >60 mL/min (ref >60)
Glucose, Bld: 143 mg/dL — ABNORMAL HIGH (ref 70–99)
Potassium: 3.1 mmol/L — ABNORMAL LOW (ref 3.5–5.1)
Sodium: 135 mmol/L (ref 135–145)

## 2021-04-03 LAB — ETHANOL: Alcohol, Ethyl (B): <10 mg/dL (ref <10)

## 2021-04-03 MED ORDER — LEVETIRACETAM IN NACL 1000 MG/100ML IV SOLN
1000.0000 mg | Freq: Once | INTRAVENOUS | Status: AC
  Administered 2021-04-03: 1000 mg via INTRAVENOUS
  Filled 2021-04-03: qty 100

## 2021-04-03 NOTE — ED Triage Notes (Signed)
Pt BIB EMS from group home. Pt was walking down some stairs and started stumbling and fell onto another resident. Pt became stiff and was lowered to the ground and pt began shaking. Upon EMS arrival that was incontinent of urine and confused. Hx of seizures.  ? ?BP 167/110 ?HR 108 ?99% RA ?CBG 104 ?20G LFA ?

## 2021-04-03 NOTE — ED Provider Notes (Signed)
?[facility]-EMERGENCY DEPT ?Provider Note ? ? ?CSN: [id number] ?Arrival date & time: 04/03/21  1055 ? ?  ? ?History ? ?Chief Complaint  ?Patient presents with  ? Seizures  ? ? ?AYDAN LEVITZ is a 61 y.o. male. ? ?Patient is a 61 year old male who presents after having a seizure.  He lives in a boardinghouse.  He was walking down some stairs and started stumbling and fell into another resident.  He had generalized shaking activity.  He does not remember this episode.  He denies any injuries from the fall.  No headache.  No facial pain.  No neck or back pain.  He has some ecchymotic areas on his face which he says is from prior injuries.  He was incontinent of urine.  He was seen here on March 17 per chart review for seizure.  That was reportedly his first seizure.  He was started on Keppra as he had had 2 episodes of seizure activity in the ED.  He has not yet filled his prescription for the Keppra.  He does use alcohol and he said the last time he drank was 2 days ago. ? ? ?  ? ?Home Medications ?Prior to Admission medications   ?Medication Sig Start Date End Date Taking? Authorizing Provider  ?folic acid (FOLVITE) 1 MG tablet Take 1 tablet (1 mg total) by mouth daily. 03/06/21   Alwyn Ren, MD  ?levETIRAcetam (KEPPRA) 500 MG tablet Take 1 tablet (500 mg total) by mouth 2 (two) times daily. 03/24/21   Dione Booze, MD  ?Multiple Vitamin (MULTIVITAMIN WITH MINERALS) TABS tablet Take 1 tablet by mouth daily. 03/06/21   Alwyn Ren, MD  ?omeprazole (PRILOSEC) 20 MG capsule Take 1 capsule (20 mg total) by mouth 2 (two) times daily for 14 days. 03/13/21 03/27/21  Lynann Bologna, MD  ?pantoprazole (PROTONIX) 40 MG tablet Take 1 tablet (40 mg total) by mouth 2 (two) times daily. 03/05/21 03/05/22  Alwyn Ren, MD  ?thiamine 100 MG tablet Take 1 tablet (100 mg total) by mouth daily. 03/06/21   Alwyn Ren, MD  ?traZODone (DESYREL) 150 MG tablet Take 150 mg by mouth at bedtime.  10/05/19   [provider]  ?   ? ?Allergies    ?Patient has no known allergies.   ? ?Review of Systems   ?Review of Systems  ?Constitutional:  Negative for chills, diaphoresis, fatigue and fever.  ?HENT:  Negative for congestion, rhinorrhea and sneezing.   ?Eyes: Negative.   ?Respiratory:  Negative for cough, chest tightness and shortness of breath.   ?Cardiovascular:  Negative for chest pain and leg swelling.  ?Gastrointestinal:  Negative for abdominal pain, blood in stool, diarrhea, nausea and vomiting.  ?Genitourinary:  Negative for difficulty urinating, flank pain, frequency and hematuria.  ?Musculoskeletal:  Negative for arthralgias and back pain.  ?Skin:  Negative for rash.  ?Neurological:  Positive for seizures. Negative for dizziness, speech difficulty, weakness, numbness and headaches.  ? ?Physical Exam ?Updated Vital Signs ?BP (!) 151/109   Pulse 84   Temp 97.6 ?F (36.4 ?C) (Oral)   Resp 16   SpO2 100%  ?Physical Exam ?Constitutional:   ?   Appearance: He is well-developed.  ?HENT:  ?   Head: Normocephalic.  ?   Comments: There is some ecchymosis under both eyes and on his nose and forehead.  He states these are injuries from prior fall and not a recent episode. ?Eyes:  ?   Pupils: Pupils  are equal, round, and reactive to light.  ?Neck:  ?   Comments: No pain along the spine ?Cardiovascular:  ?   Rate and Rhythm: Normal rate and regular rhythm.  ?   Heart sounds: Normal heart sounds.  ?Pulmonary:  ?   Effort: Pulmonary effort is normal. No respiratory distress.  ?   Breath sounds: Normal breath sounds. No wheezing or rales.  ?Chest:  ?   Chest wall: No tenderness.  ?Abdominal:  ?   General: Bowel sounds are normal.  ?   Palpations: Abdomen is soft.  ?   Tenderness: There is no abdominal tenderness. There is no guarding or rebound.  ?Musculoskeletal:     ?   General: Normal range of motion.  ?   Cervical back: Normal range of motion and neck supple.  ?Lymphadenopathy:  ?   Cervical: No cervical  adenopathy.  ?Skin: ?   General: Skin is warm and dry.  ?   Findings: No rash.  ?Neurological:  ?   General: No focal deficit present.  ?   Mental Status: He is alert and oriented to person, place, and time.  ? ? ?ED Results / Procedures / Treatments   ?Labs ?(all labs ordered are listed, but only abnormal results are displayed) ?Labs Reviewed  ?CBC WITH DIFFERENTIAL/PLATELET - Abnormal; Notable for the following components:  ?    Result Value  ? RBC 4.04 (*)   ? Hemoglobin 12.1 (*)   ? HCT 36.9 (*)   ? RDW 16.0 (*)   ? Platelets 418 (*)   ? Lymphs Abs 0.4 (*)   ? All other components within normal limits  ?BASIC METABOLIC PANEL - Abnormal; Notable for the following components:  ? Potassium 3.1 (*)   ? CO2 20 (*)   ? Glucose, Bld 143 (*)   ? Calcium 8.7 (*)   ? All other components within normal limits  ?ETHANOL  ? ? ?EKG ?None ? ?Radiology ?No results found. ? ?Procedures ?Procedures  ? ? ?Medications Ordered in ED ?Medications  ?levETIRAcetam (KEPPRA) IVPB 1000 mg/100 mL premix (0 mg Intravenous Stopped 04/03/21 1226)  ? ? ?ED Course/ Medical Decision Making/ A&P ?  ?                        ?Medical Decision Making ?Amount and/or Complexity of Data Reviewed ?Labs: ordered. ? ?Risk ?Prescription drug management. ? ? ?Patient is a 61 year old male who presents after a seizure.  He does not report any injuries from the seizure.  He has some old appearing bruises on his face but does not report any injuries today.  He is still continuing to drink alcohol although this last ingestion was 2 days ago per his report.  He was given a prescription for Keppra 10 days ago during his ED visit but did not get it filled.  He was loaded with Keppra here in the emergency department.  He has not had any further seizure activity.  His labs were done and are nonconcerning.  He was discharged home in good condition.  He was encouraged to make sure that he starts taking his Keppra.  A second referral to neurology was replaced.  He was  advised to ensure follow-up.  Return precautions were given. ? ?Final Clinical Impression(s) / ED Diagnoses ?Final diagnoses:  ?Seizure (HCC)  ? ? ?Rx / DC Orders ?ED Discharge Orders   ? ?      Ordered  ?  Ambulatory referral to Neurology       ?Comments: An appointment is requested in approximately: 1 week  ? 04/03/21 1353  ? ?  ?  ? ?  ? ? ?  ?Rolan Bucco, MD ?04/03/21 1355 ? ?

## 2021-05-04 ENCOUNTER — Emergency Department (HOSPITAL_COMMUNITY)
Admission: EM | Admit: 2021-05-04 | Discharge: 2021-05-04 | Disposition: A | Discharge status: Home / Self Care | Payer: Self-pay | Attending: Emergency Medicine | Admitting: Emergency Medicine

## 2021-05-04 ENCOUNTER — Other Ambulatory Visit: Payer: Self-pay

## 2021-05-04 ENCOUNTER — Emergency Department (HOSPITAL_COMMUNITY): Payer: Self-pay

## 2021-05-04 ENCOUNTER — Encounter (HOSPITAL_COMMUNITY): Payer: Self-pay | Admitting: Emergency Medicine

## 2021-05-04 DIAGNOSIS — R531 Weakness: Secondary | ICD-10-CM | POA: Insufficient documentation

## 2021-05-04 DIAGNOSIS — I509 Heart failure, unspecified: Secondary | ICD-10-CM | POA: Insufficient documentation

## 2021-05-04 DIAGNOSIS — R4182 Altered mental status, unspecified: Secondary | ICD-10-CM | POA: Insufficient documentation

## 2021-05-04 DIAGNOSIS — Y908 Blood alcohol level of 240 mg/100 ml or more: Secondary | ICD-10-CM | POA: Insufficient documentation

## 2021-05-04 DIAGNOSIS — R569 Unspecified convulsions: Secondary | ICD-10-CM | POA: Insufficient documentation

## 2021-05-04 LAB — CBC WITH DIFFERENTIAL/PLATELET
Abs Immature Granulocytes: 0.01 10*3/uL (ref 0.00–0.07)
Basophils Absolute: 0 10*3/uL (ref 0.0–0.1)
Basophils Relative: 1 %
Eosinophils Absolute: 0.1 10*3/uL (ref 0.0–0.5)
Eosinophils Relative: 3 %
HCT: 35.1 % — ABNORMAL LOW (ref 39.0–52.0)
Hemoglobin: 11.5 g/dL — ABNORMAL LOW (ref 13.0–17.0)
Immature Granulocytes: 0 %
Lymphocytes Relative: 14 %
Lymphs Abs: 0.5 10*3/uL — ABNORMAL LOW (ref 0.7–4.0)
MCH: 28.8 pg (ref 26.0–34.0)
MCHC: 32.8 g/dL (ref 30.0–36.0)
MCV: 88 fL (ref 80.0–100.0)
Monocytes Absolute: 0.2 10*3/uL (ref 0.1–1.0)
Monocytes Relative: 6 %
Neutro Abs: 2.8 10*3/uL (ref 1.7–7.7)
Neutrophils Relative %: 76 %
Platelets: 239 10*3/uL (ref 150–400)
RBC: 3.99 MIL/uL — ABNORMAL LOW (ref 4.22–5.81)
RDW: 17.8 % — ABNORMAL HIGH (ref 11.5–15.5)
WBC: 3.6 10*3/uL — ABNORMAL LOW (ref 4.0–10.5)
nRBC: 0 % (ref 0.0–0.2)

## 2021-05-04 LAB — COMPREHENSIVE METABOLIC PANEL
ALT: 25 U/L (ref 0–44)
AST: 44 U/L — ABNORMAL HIGH (ref 15–41)
Albumin: 3.2 g/dL — ABNORMAL LOW (ref 3.5–5.0)
Alkaline Phosphatase: 97 U/L (ref 38–126)
Anion gap: 13 (ref 5–15)
BUN: 5 mg/dL — ABNORMAL LOW (ref 6–20)
CO2: 21 mmol/L — ABNORMAL LOW (ref 22–32)
Calcium: 8.7 mg/dL — ABNORMAL LOW (ref 8.9–10.3)
Chloride: 108 mmol/L (ref 98–111)
Creatinine, Ser: 1.22 mg/dL (ref 0.61–1.24)
GFR, Estimated: >60 mL/min (ref >60)
Glucose, Bld: 89 mg/dL (ref 70–99)
Potassium: 3.7 mmol/L (ref 3.5–5.1)
Sodium: 142 mmol/L (ref 135–145)
Total Bilirubin: 0.8 mg/dL (ref 0.3–1.2)
Total Protein: 6.5 g/dL (ref 6.5–8.1)

## 2021-05-04 LAB — URINALYSIS, ROUTINE W REFLEX MICROSCOPIC
Bilirubin Urine: NEGATIVE
Glucose, UA: NEGATIVE mg/dL
Hgb urine dipstick: NEGATIVE
Ketones, ur: NEGATIVE mg/dL
Leukocytes,Ua: NEGATIVE
Nitrite: NEGATIVE
Protein, ur: NEGATIVE mg/dL
Specific Gravity, Urine: 1.003 — ABNORMAL LOW (ref 1.005–1.030)
pH: 6 (ref 5.0–8.0)

## 2021-05-04 LAB — RAPID URINE DRUG SCREEN, HOSP PERFORMED
Amphetamines: NOT DETECTED
Barbiturates: NOT DETECTED
Benzodiazepines: NOT DETECTED
Cocaine: NOT DETECTED
Opiates: NOT DETECTED
Tetrahydrocannabinol: NOT DETECTED

## 2021-05-04 LAB — LACTIC ACID, PLASMA
Lactic Acid, Venous: 3.1 mmol/L (ref 0.5–1.9)
Lactic Acid, Venous: 2.8 mmol/L (ref 0.5–1.9)

## 2021-05-04 LAB — MAGNESIUM: Magnesium: 1.7 mg/dL (ref 1.7–2.4)

## 2021-05-04 LAB — ETHANOL: Alcohol, Ethyl (B): 240 mg/dL — ABNORMAL HIGH (ref <10)

## 2021-05-04 LAB — CBG MONITORING, ED: Glucose-Capillary: 90 mg/dL (ref 70–99)

## 2021-05-04 LAB — PROTIME-INR
INR: 1.1 (ref 0.8–1.2)
Prothrombin Time: 14.1 seconds (ref 11.4–15.2)

## 2021-05-04 LAB — AMMONIA: Ammonia: 36 umol/L — ABNORMAL HIGH (ref 9–35)

## 2021-05-04 MED ORDER — LACTATED RINGERS IV BOLUS
1000.0000 mL | Freq: Once | INTRAVENOUS | Status: AC
  Administered 2021-05-04: 1000 mL via INTRAVENOUS

## 2021-05-04 MED ORDER — SODIUM CHLORIDE 0.9 % IV SOLN: 2000.0000 mg | Freq: Once | INTRAVENOUS | Status: DC

## 2021-05-04 MED ORDER — THIAMINE HCL 100 MG/ML IJ SOLN
100.0000 mg | Freq: Every day | INTRAMUSCULAR | Status: DC
  Administered 2021-05-04: 100 mg via INTRAVENOUS
  Filled 2021-05-04: qty 2

## 2021-05-04 MED ORDER — LEVETIRACETAM IN NACL 1000 MG/100ML IV SOLN
1000.0000 mg | INTRAVENOUS | Status: AC
  Administered 2021-05-04 (×2): 1000 mg via INTRAVENOUS
  Filled 2021-05-04: qty 100

## 2021-05-04 NOTE — ED Provider Notes (Signed)
?[facility] ?Provider Note ? ? ?CSN: [id number] ?Arrival date & time: 05/04/21  1317 ? ?  ? ?History ? ?Chief Complaint  ?Patient presents with  ? Seizures  ? ? ?Rodney Hartman is a 61 y.o. male who presents for altered mental status.  Patient lives in a boarding home and apparently has a history of alcohol abuse and dependence, seizures, subdural hematoma, upper GI bleed, CHF, vasovagal syncope, and has not been taking his seizure medication for a month.  He does not know what seizure medication he is supposed to take.  EMS reports that the patient may have had a seizure but was essentially found down by other tendons of the boardinghouse.  He did not have a witnessed seizure.  Patient does not remember having a seizure.  He denies any alcohol use today.   ?Seizures ? ?  ? ?Home Medications ?Prior to Admission medications   ?Medication Sig Start Date End Date Taking? Authorizing Provider  ?amLODipine (NORVASC) 10 MG tablet Take 10 mg by mouth daily. ?Patient not taking: Reported on 05/04/2021 02/27/21   [provider]  ?citalopram (CELEXA) 40 MG tablet Take 40 mg by mouth daily. ?Patient not taking: Reported on 05/04/2021 02/27/21   [provider]  ?folic acid (FOLVITE) 1 MG tablet Take 1 tablet (1 mg total) by mouth daily. ?Patient not taking: Reported on 05/04/2021 03/06/21   Alwyn RenMathews, Elizabeth G, MD  ?gabapentin (NEURONTIN) 300 MG capsule Take 300 mg by mouth 3 (three) times daily. ?Patient not taking: Reported on 05/04/2021 02/27/21   [provider]  ?hydrochlorothiazide (HYDRODIURIL) 25 MG tablet Take 25 mg by mouth daily. ?Patient not taking: Reported on 05/04/2021 02/27/21   [provider]  ?levETIRAcetam (KEPPRA) 500 MG tablet Take 1 tablet (500 mg total) by mouth 2 (two) times daily. ?Patient not taking: Reported on 05/04/2021 03/24/21   Dione BoozeGlick, David, MD  ?Multiple Vitamin (MULTIVITAMIN WITH MINERALS) TABS tablet Take 1 tablet by mouth  daily. ?Patient not taking: Reported on 05/04/2021 03/06/21   Alwyn RenMathews, Elizabeth G, MD  ?omeprazole (PRILOSEC) 20 MG capsule Take 1 capsule (20 mg total) by mouth 2 (two) times daily for 14 days. 03/13/21 03/27/21  Lynann BolognaGupta, Rajesh, MD  ?pantoprazole (PROTONIX) 40 MG tablet Take 1 tablet (40 mg total) by mouth 2 (two) times daily. ?Patient not taking: Reported on 05/04/2021 03/05/21 03/05/22  Alwyn RenMathews, Elizabeth G, MD  ?thiamine 100 MG tablet Take 1 tablet (100 mg total) by mouth daily. ?Patient not taking: Reported on 05/04/2021 03/06/21   Alwyn RenMathews, Elizabeth G, MD  ?traZODone (DESYREL) 150 MG tablet Take 150 mg by mouth at bedtime. ?Patient not taking: Reported on 05/04/2021 10/05/19   [provider]  ?   ? ?Allergies    ?Patient has no known allergies.   ? ?Review of Systems   ?Review of Systems  ?Neurological:  Positive for seizures.  ? ?Physical Exam ?Updated Vital Signs ?BP 138/75   Pulse 76   Temp 97.8 ?F (36.6 ?C) (Oral)   Resp 16   SpO2 100%  ?Physical Exam ?Vitals and nursing note reviewed.  ?Constitutional:   ?   General: He is not in acute distress. ?   Appearance: He is well-developed. He is not diaphoretic.  ?HENT:  ?   Head: Normocephalic.  ?   Comments: Multiple old and new bruises of the face no tongue trauma ?Eyes:  ?   General: No scleral icterus. ?   Conjunctiva/sclera: Conjunctivae normal.  ?Cardiovascular:  ?  Rate and Rhythm: Normal rate and regular rhythm.  ?   Heart sounds: Normal heart sounds.  ?Pulmonary:  ?   Effort: Pulmonary effort is normal. No respiratory distress.  ?   Breath sounds: Normal breath sounds.  ?Abdominal:  ?   Palpations: Abdomen is soft.  ?   Tenderness: There is no abdominal tenderness.  ?Musculoskeletal:  ?   Cervical back: Normal range of motion and neck supple.  ?Skin: ?   General: Skin is warm and dry.  ?Neurological:  ?   Mental Status: He is alert. He is disoriented.  ?   Motor: Weakness present.  ?   Comments: Mild weakness to left upper and lower  extremity ?Patient disoriented to time place and event.  He is able to tell his name but had difficulty recalling his last name.  ?Psychiatric:     ?   Behavior: Behavior normal.  ? ? ?ED Results / Procedures / Treatments   ?Labs ?(all labs ordered are listed, but only abnormal results are displayed) ?Labs Reviewed  ?COMPREHENSIVE METABOLIC PANEL - Abnormal; Notable for the following components:  ?    Result Value  ? CO2 21 (*)   ? BUN 5 (*)   ? Calcium 8.7 (*)   ? Albumin 3.2 (*)   ? AST 44 (*)   ? All other components within normal limits  ?CBC WITH DIFFERENTIAL/PLATELET - Abnormal; Notable for the following components:  ? WBC 3.6 (*)   ? RBC 3.99 (*)   ? Hemoglobin 11.5 (*)   ? HCT 35.1 (*)   ? RDW 17.8 (*)   ? Lymphs Abs 0.5 (*)   ? All other components within normal limits  ?URINALYSIS, ROUTINE W REFLEX MICROSCOPIC - Abnormal; Notable for the following components:  ? Color, Urine STRAW (*)   ? Specific Gravity, Urine 1.003 (*)   ? All other components within normal limits  ?ETHANOL - Abnormal; Notable for the following components:  ? Alcohol, Ethyl (B) 240 (*)   ? All other components within normal limits  ?AMMONIA - Abnormal; Notable for the following components:  ? Ammonia 36 (*)   ? All other components within normal limits  ?LACTIC ACID, PLASMA - Abnormal; Notable for the following components:  ? Lactic Acid, Venous 3.1 (*)   ? All other components within normal limits  ?MAGNESIUM  ?PROTIME-INR  ?RAPID URINE DRUG SCREEN, HOSP PERFORMED  ?VITAMIN B1  ?LACTIC ACID, PLASMA  ?CBG MONITORING, ED  ? ? ?EKG ?None ? ?Radiology ?CT Head Wo Contrast ? ?Result Date: 05/04/2021 ?CLINICAL DATA:  Facial trauma.  Seizure, fall EXAM: CT HEAD WITHOUT CONTRAST CT CERVICAL SPINE WITHOUT CONTRAST TECHNIQUE: Multidetector CT imaging of the head and cervical spine was performed following the standard protocol without intravenous contrast. Multiplanar CT image reconstructions of the cervical spine were also generated. RADIATION  DOSE REDUCTION: This exam was performed according to the departmental dose-optimization program which includes automated exposure control, adjustment of the mA and/or kV according to patient size and/or use of iterative reconstruction technique. COMPARISON:  CT head 03/24/2021 FINDINGS: CT HEAD FINDINGS Brain: No evidence of acute infarction, hemorrhage, hydrocephalus, extra-axial collection or mass lesion/mass effect. Mild atrophy Vascular: Negative for hyperdense vessel Skull: Negative for acute fracture. Chronic appearing fracture of the anterior wall the maxillary sinus bilaterally in the nasal bone. Sinuses/Orbits: Mild mucosal edema maxillary sinus bilaterally. No air-fluid level. No orbital mass or edema. Other: Right frontal mild scalp hematoma. CT CERVICAL SPINE FINDINGS Alignment: 3  mm anterolisthesis C7-T1. Mild retrolisthesis C5-6 and C6-7 Skull base and vertebrae: Negative for fracture Soft tissues and spinal canal: No soft tissue mass or edema. Disc levels: Posterior fusion C3 through C5 with cerclage wires and solid posterior bony fusion of the posterior elements of these levels. Disc degeneration and spurring C5-6, C6-7. Extensive facet degeneration at C7-T1 Upper chest: Lung apices clear bilaterally Other: None IMPRESSION: Negative CT head Negative for cervical spine fracture Electronically Signed   By: Marlan Palau M.D.   On: 05/04/2021 15:21  ? ?CT Cervical Spine Wo Contrast ? ?Result Date: 05/04/2021 ?CLINICAL DATA:  Facial trauma.  Seizure, fall EXAM: CT HEAD WITHOUT CONTRAST CT CERVICAL SPINE WITHOUT CONTRAST TECHNIQUE: Multidetector CT imaging of the head and cervical spine was performed following the standard protocol without intravenous contrast. Multiplanar CT image reconstructions of the cervical spine were also generated. RADIATION DOSE REDUCTION: This exam was performed according to the departmental dose-optimization program which includes automated exposure control, adjustment of the  mA and/or kV according to patient size and/or use of iterative reconstruction technique. COMPARISON:  CT head 03/24/2021 FINDINGS: CT HEAD FINDINGS Brain: No evidence of acute infarction, hemorrhage, hydrocephalus, extra-axial co

## 2021-05-04 NOTE — ED Triage Notes (Signed)
Pt arrives via EMS from a boarding house. Pt was seen stumbling around into bathroom. Pt fell on the ground. Assumed that pt had a seizure, but not witnessed. EMS reports  confusion when they arrived. Pupils are pinpoint per EMS. Pt more alert on arrival. Recent diagnosis of seizures.  ?Pt has been out of seizure meds for approx a month.  ?BP 90/58, HR 70, 100% on room air. CBG 92 ?

## 2021-05-04 NOTE — ED Provider Notes (Signed)
Transfer of Care Note ?I assumed care of Rodney Hartman on 05/04/2021 at @NOWNR @. ? ?Briefly, Rodney Hartman is a 61 y.o. male who: ?- lives boarding house, hx of ETOH use, hx of Subdural, vaso vagal and sz ?- Possible sz today (non compliant with AED), unclear, found on the floor ?- Here w AMS, concern for postictal ?-Labs are grossly unremarkable, CT head and CT cervical without acute finding ? ?  ?CT Head Wo Contrast  ?Final Result  ?  ?CT Cervical Spine Wo Contrast  ?Final Result  ?  ? ?The plan includes: ?- reassess, patient is back to baseline okay for discharge ? ? ?Please refer to the original provider?s note for additional information regarding the care of 48. ? ?### ?Reassessment: ?I personally reassessed the patient: ?-He is mildly confused and tired appearing.  However no focal finding.  He informs he has not been taking his medication. ? ?Additional MDM: ?-I have given him a bolus of LR, and loaded him with Keppra 2000 mg. ?-Further reassessment, patient is becoming more alert and awake.  He was able to ambulate to the restroom without gait difficulty.  ?-Believe patient is safe for discharge.  I have discussed strict return precaution.  In addition, I recommended that he continue his Keppra to avoid seizure.  I informed him to follow-up with his PCP and neurology soon as possible.  Patient understand and agrees with plan. ? ?Dispo: D/C  ?  ?[employer], MD ?05/04/21 2226 ? ?  ?[id number], MD ?05/04/21 2327 ? ?

## 2021-05-04 NOTE — ED Notes (Signed)
Walks to bathroom and back on own ability. ?

## 2021-05-04 NOTE — ED Notes (Signed)
Pt verbalized understanding of d/c instructions, meds, and followup care. Denies questions. VSS, no distress noted. Steady gait to exit with all belongings.  ?

## 2021-05-04 NOTE — Discharge Instructions (Signed)
Follow UP with PCP in 1-2 days.  ?Your Workup did not show anything acute. ?Come back to the ED for worsening symptoms.  ?

## 2021-05-05 LAB — VITAMIN B1: Vitamin B1 (Thiamine): 82 nmol/L (ref 66.5–200.0)

## 2021-05-29 ENCOUNTER — Encounter (HOSPITAL_COMMUNITY): Payer: Self-pay

## 2021-05-29 ENCOUNTER — Other Ambulatory Visit: Payer: Self-pay

## 2021-05-29 ENCOUNTER — Emergency Department (HOSPITAL_COMMUNITY)
Admission: EM | Admit: 2021-05-29 | Discharge: 2021-05-29 | Disposition: A | Discharge status: Home / Self Care | Payer: Self-pay | Attending: Emergency Medicine | Admitting: Emergency Medicine

## 2021-05-29 DIAGNOSIS — F1020 Alcohol dependence, uncomplicated: Secondary | ICD-10-CM | POA: Insufficient documentation

## 2021-05-29 DIAGNOSIS — I509 Heart failure, unspecified: Secondary | ICD-10-CM | POA: Insufficient documentation

## 2021-05-29 DIAGNOSIS — G40909 Epilepsy, unspecified, not intractable, without status epilepticus: Secondary | ICD-10-CM | POA: Insufficient documentation

## 2021-05-29 DIAGNOSIS — Z79899 Other long term (current) drug therapy: Secondary | ICD-10-CM | POA: Insufficient documentation

## 2021-05-29 DIAGNOSIS — I10 Essential (primary) hypertension: Secondary | ICD-10-CM

## 2021-05-29 DIAGNOSIS — E876 Hypokalemia: Secondary | ICD-10-CM | POA: Insufficient documentation

## 2021-05-29 DIAGNOSIS — H539 Unspecified visual disturbance: Secondary | ICD-10-CM | POA: Insufficient documentation

## 2021-05-29 DIAGNOSIS — R739 Hyperglycemia, unspecified: Secondary | ICD-10-CM | POA: Insufficient documentation

## 2021-05-29 DIAGNOSIS — D649 Anemia, unspecified: Secondary | ICD-10-CM | POA: Insufficient documentation

## 2021-05-29 DIAGNOSIS — R569 Unspecified convulsions: Secondary | ICD-10-CM

## 2021-05-29 DIAGNOSIS — I110 Hypertensive heart disease with heart failure: Secondary | ICD-10-CM | POA: Insufficient documentation

## 2021-05-29 LAB — COMPREHENSIVE METABOLIC PANEL
ALT: 26 U/L (ref 0–44)
AST: 35 U/L (ref 15–41)
Albumin: 3.6 g/dL (ref 3.5–5.0)
Alkaline Phosphatase: 94 U/L (ref 38–126)
Anion gap: 7 (ref 5–15)
BUN: 12 mg/dL (ref 6–20)
CO2: 26 mmol/L (ref 22–32)
Calcium: 8.6 mg/dL — ABNORMAL LOW (ref 8.9–10.3)
Chloride: 107 mmol/L (ref 98–111)
Creatinine, Ser: 0.93 mg/dL (ref 0.61–1.24)
GFR, Estimated: >60 mL/min (ref >60)
Glucose, Bld: 119 mg/dL — ABNORMAL HIGH (ref 70–99)
Potassium: 3.2 mmol/L — ABNORMAL LOW (ref 3.5–5.1)
Sodium: 140 mmol/L (ref 135–145)
Total Bilirubin: 0.7 mg/dL (ref 0.3–1.2)
Total Protein: 7.3 g/dL (ref 6.5–8.1)

## 2021-05-29 LAB — CBC WITH DIFFERENTIAL/PLATELET
Abs Immature Granulocytes: 0.02 10*3/uL (ref 0.00–0.07)
Basophils Absolute: 0 10*3/uL (ref 0.0–0.1)
Basophils Relative: 0 %
Eosinophils Absolute: 0 10*3/uL (ref 0.0–0.5)
Eosinophils Relative: 0 %
HCT: 34.9 % — ABNORMAL LOW (ref 39.0–52.0)
Hemoglobin: 11.6 g/dL — ABNORMAL LOW (ref 13.0–17.0)
Immature Granulocytes: 0 %
Lymphocytes Relative: 6 %
Lymphs Abs: 0.4 10*3/uL — ABNORMAL LOW (ref 0.7–4.0)
MCH: 28.6 pg (ref 26.0–34.0)
MCHC: 33.2 g/dL (ref 30.0–36.0)
MCV: 86 fL (ref 80.0–100.0)
Monocytes Absolute: 0.3 10*3/uL (ref 0.1–1.0)
Monocytes Relative: 4 %
Neutro Abs: 5.8 10*3/uL (ref 1.7–7.7)
Neutrophils Relative %: 90 %
Platelets: 173 10*3/uL (ref 150–400)
RBC: 4.06 MIL/uL — ABNORMAL LOW (ref 4.22–5.81)
RDW: 19.5 % — ABNORMAL HIGH (ref 11.5–15.5)
WBC: 6.5 10*3/uL (ref 4.0–10.5)
nRBC: 0 % (ref 0.0–0.2)

## 2021-05-29 LAB — ETHANOL: Alcohol, Ethyl (B): <10 mg/dL (ref <10)

## 2021-05-29 LAB — LACTIC ACID, PLASMA: Lactic Acid, Venous: 2.4 mmol/L (ref 0.5–1.9)

## 2021-05-29 LAB — CBG MONITORING, ED: Glucose-Capillary: 142 mg/dL — ABNORMAL HIGH (ref 70–99)

## 2021-05-29 LAB — AMMONIA: Ammonia: 28 umol/L (ref 9–35)

## 2021-05-29 LAB — MAGNESIUM: Magnesium: 1.8 mg/dL (ref 1.7–2.4)

## 2021-05-29 MED ORDER — IBUPROFEN 800 MG PO TABS
800.0000 mg | ORAL_TABLET | Freq: Once | ORAL | Status: AC
Start: 2021-05-29 — End: 2021-05-29
  Administered 2021-05-29: 800 mg via ORAL
  Filled 2021-05-29: qty 1

## 2021-05-29 MED ORDER — LEVETIRACETAM IN NACL 1500 MG/100ML IV SOLN
1500.0000 mg | Freq: Once | INTRAVENOUS | Status: AC
  Administered 2021-05-29: 1500 mg via INTRAVENOUS
  Filled 2021-05-29: qty 100

## 2021-05-29 MED ORDER — LORAZEPAM 2 MG/ML IJ SOLN
1.0000 mg | Freq: Once | INTRAMUSCULAR | Status: AC
  Administered 2021-05-29: 1 mg via INTRAVENOUS
  Filled 2021-05-29: qty 1

## 2021-05-29 MED ORDER — POTASSIUM CHLORIDE CRYS ER 20 MEQ PO TBCR
40.0000 meq | EXTENDED_RELEASE_TABLET | Freq: Once | ORAL | Status: AC
  Administered 2021-05-29: 40 meq via ORAL
  Filled 2021-05-29: qty 2

## 2021-05-29 NOTE — Discharge Instructions (Addendum)
Adherence to ER seizure medication regimen is as of the utmost importance, as we discussed.  Please member to take your Keppra daily, in addition to your blood pressure medications and your thiamine.  Please follow-up with your primary care within the next 2 to 3 days for reevaluation and continued medical management.  Return to the ED for new or worsening symptoms as discussed.

## 2021-05-29 NOTE — ED Notes (Signed)
I provided reinforced discharge education based off of discharge instructions. Pt acknowledged and understood my education. Pt had no further questions/concerns for provider/myself.  °

## 2021-05-29 NOTE — ED Provider Notes (Signed)
[facility]-EMERGENCY DEPT Provider Note   CSN: [id number] Arrival date & time: 05/29/21  1755     History  Chief Complaint  Patient presents with   Seizures    Rodney Hartman is a 61 y.o. male with chief complaint of possible witnessed seizure today about 4 to 6 hours ago.  Hx of seizures and recurrent noncompliance with medical regimen.  Lives in a group home.  His roommates witnessed the apparent seizure.  Patient was told he fell to his knees, began shaking, and the eyes rolled back into his head.  Unknown how long this lasted.  Was informed he did not hit his head from the individuals who witnessed the event.  Significant Hx of severe alcohol use disorder.  Drinks unknown amount of beers daily, last consumption was last night.  Also Hx of upper GI bleed, subdural hematoma, CHF, HTN uncontrolled, and vasovagal symptoms.  States he forgot to take his Keppra, and notes he took 1 pill about 2 days ago.  Denies chest pain, shortness of breath, nausea, vomiting, bowel incontinence, abdominal pain, head pain.  Endorses mild vision changes but is unable to further describe what this means.  Also believes he is dehydrated.  The history is provided by the patient and medical records.  Seizures     Home Medications Prior to Admission medications   Medication Sig Start Date End Date Taking? Authorizing Provider  citalopram (CELEXA) 40 MG tablet Take 40 mg by mouth daily. 02/27/21  Yes [provider]  folic acid (FOLVITE) 1 MG tablet Take 1 tablet (1 mg total) by mouth daily. 03/06/21  Yes Alwyn Ren, MD  gabapentin (NEURONTIN) 300 MG capsule Take 300 mg by mouth 3 (three) times daily. 02/27/21  Yes [provider]  hydrochlorothiazide (HYDRODIURIL) 25 MG tablet Take 25 mg by mouth daily. 02/27/21  Yes [provider]  levETIRAcetam (KEPPRA) 500 MG tablet Take 1 tablet (500 mg total) by mouth 2 (two) times daily. 03/24/21  Yes Dione Booze, MD   traZODone (DESYREL) 150 MG tablet Take 150 mg by mouth at bedtime. 10/05/19  Yes [provider]  Multiple Vitamin (MULTIVITAMIN WITH MINERALS) TABS tablet Take 1 tablet by mouth daily. Patient not taking: Reported on 05/04/2021 03/06/21   Alwyn Ren, MD  omeprazole (PRILOSEC) 20 MG capsule Take 1 capsule (20 mg total) by mouth 2 (two) times daily for 14 days. 03/13/21 03/27/21  Lynann Bologna, MD  pantoprazole (PROTONIX) 40 MG tablet Take 1 tablet (40 mg total) by mouth 2 (two) times daily. Patient not taking: Reported on 05/04/2021 03/05/21 03/05/22  Alwyn Ren, MD  thiamine 100 MG tablet Take 1 tablet (100 mg total) by mouth daily. Patient not taking: Reported on 05/04/2021 03/06/21   Alwyn Ren, MD      Allergies    Patient has no known allergies.    Review of Systems   Review of Systems  Neurological:  Positive for seizures.   Physical Exam Updated Vital Signs BP (!) 174/110   Pulse (!) 106   Temp 98.1 F (36.7 C) (Oral)   Resp (!) 22   SpO2 100%  Physical Exam Vitals and nursing note reviewed.  Constitutional:      General: He is not in acute distress.    Appearance: Normal appearance. He is well-developed. He is not ill-appearing or diaphoretic.     Comments: Urine appreciated on pt's pants  HENT:     Head: Normocephalic and atraumatic.  Mouth/Throat:     Comments: No tongue trauma appreciated Eyes:     General: Lids are normal. Vision grossly intact. Gaze aligned appropriately. No visual field deficit.    Extraocular Movements: Extraocular movements intact.     Right eye: No nystagmus.     Left eye: No nystagmus.     Conjunctiva/sclera: Conjunctivae normal.     Right eye: Right conjunctiva is not injected. No exudate or hemorrhage.    Left eye: Left conjunctiva is not injected. No exudate or hemorrhage.    Pupils: Pupils are equal, round, and reactive to light.     Visual Fields: Right eye visual fields normal and left eye visual  fields normal.  Cardiovascular:     Rate and Rhythm: Normal rate and regular rhythm.     Heart sounds: No murmur heard. Pulmonary:     Effort: Pulmonary effort is normal. No respiratory distress.     Breath sounds: Normal breath sounds.  Chest:     Chest wall: No mass, deformity, swelling, tenderness or crepitus.  Abdominal:     Palpations: Abdomen is soft.     Tenderness: There is no abdominal tenderness.  Musculoskeletal:        General: No swelling.     Cervical back: Neck supple.  Skin:    General: Skin is warm and dry.     Capillary Refill: Capillary refill takes less than 2 seconds.  Neurological:     General: No focal deficit present.     Mental Status: He is alert and oriented to person, place, and time.     GCS: GCS eye subscore is 4. GCS verbal subscore is 5. GCS motor subscore is 6.     Cranial Nerves: No dysarthria or facial asymmetry.     Motor: Tremor present. No weakness or pronator drift.     Coordination: Coordination is intact. Finger-Nose-Finger Test and Heel to [place] Test normal.     Comments: No slurred speech, facial asymmetry, pronator drift, abnormal EOMs, asymmetrical pupils, discoordination, focal deficit, or appreciated weakness.  Psychiatric:        Mood and Affect: Mood normal.        Behavior: Behavior is cooperative.    ED Results / Procedures / Treatments   Labs (all labs ordered are listed, but only abnormal results are displayed) Labs Reviewed  COMPREHENSIVE METABOLIC PANEL - Abnormal; Notable for the following components:      Result Value   Potassium 3.2 (*)    Glucose, Bld 119 (*)    Calcium 8.6 (*)    All other components within normal limits  CBC WITH DIFFERENTIAL/PLATELET - Abnormal; Notable for the following components:   RBC 4.06 (*)    Hemoglobin 11.6 (*)    HCT 34.9 (*)    RDW 19.5 (*)    Lymphs Abs 0.4 (*)    All other components within normal limits  LACTIC ACID, PLASMA - Abnormal; Notable for the following components:    Lactic Acid, Venous 2.4 (*)    All other components within normal limits  CBG MONITORING, ED - Abnormal; Notable for the following components:   Glucose-Capillary 142 (*)    All other components within normal limits  AMMONIA  MAGNESIUM  ETHANOL    EKG EKG Interpretation  Date/Time:  Monday May 29 2021 19:09:53 EDT Ventricular Rate:  97 PR Interval:  221 QRS Duration: 97 QT Interval:  379 QTC Calculation: 482 R Axis:   -60 Text Interpretation: Sinus or ectopic atrial rhythm  Prolonged PR interval Consider right ventricular hypertrophy Inferior infarct, age indeterminate Baseline wander in lead(s) V2 V3 Partial missing lead(s): V2 Confirmed by Geoffery Lyons ([id number]) on 05/30/2021 11:22:15 PM  Radiology No results found.  Procedures Procedures    Medications Ordered in ED Medications  levETIRAcetam (KEPPRA) IVPB 1500 mg/ 100 mL premix (0 mg Intravenous Stopped 05/29/21 2033)  ibuprofen (ADVIL) tablet 800 mg (800 mg Oral Given 05/29/21 2013)  potassium chloride SA (KLOR-CON M) CR tablet 40 mEq (40 mEq Oral Given 05/29/21 2019)    ED Course/ Medical Decision Making/ A&P                           Medical Decision Making Amount and/or Complexity of Data Reviewed External Data Reviewed: notes. Labs: ordered. Decision-making details documented in ED Course. Radiology:  Decision-making details documented in ED Course. ECG/medicine tests: ordered and independent interpretation performed. Decision-making details documented in ED Course.  Risk OTC drugs. Prescription drug management.   61 y.o. male presents to the ED for concern of Seizures   This involves an extensive number of treatment options, and is a complaint that carries with it a high risk of complications and morbidity.    Past Medical History / Co-morbidities / Social History: Hx of severe alcohol use disorder, prior SI/HI, MDD, CHF, anemia, prior subdural hematoma, prior UGIB, seizure disorder, poor medical  compliance, daily tobacco use Social Determinants of Health include non-compliance, for which improved adherence was recommended, along with tobacco and alcohol cessation counseling  Additional History:  Internal and external records from outside source obtained and reviewed including ED visits, gastroenterology  Physical Exam: Physical exam performed. The pertinent findings include: mild tremor.  No focal or neuro deficits.  AAO x 4.  Lab Tests: I ordered, and personally interpreted labs.  The pertinent results include:   CBC: Mild anemia which is consistent with baseline CMP/BMP: Potassium of 3.2 POC glucose: 142 Ammonia: Unremarkable Magnesium: 1.8 unremarkable Lactic acid: 2.4, consistent with baseline Ethanol: Unremarkable  Imaging Studies: None  Medications: I ordered medication including Keppra and IVF for seizure maintenance and fluid replenishment, and K-Dur for potassium replenishment.  Reevaluation of the patient after these medicines showed that the patient tolerated this well and shows improvement.  I have reviewed the patients home medicines and have made adjustments as needed  ED Course/Disposition: Pt well-appearing on exam.  Presenting today with witnessed seizure without head injury.  Patient with no evidence of focal neuro deficits on physical exam and is at mental baseline.  Appears clinically dehydrated.  Not suspicious of alcohol withdrawal, acute encephalopathy, ACS, or hypoglycemia.  No evidence or suspicion of head trauma, therefore I believe imaging may be deferred at this time.  Loading dose of Keppra provided with IVF.   Electrolytes appreciated to be near baseline with exception of mild hypokalemia.  K-Dur provided in ED.  Elevated lactic acid in normal baseline range per review of recent notes and labs.  Labs without any other acute abnormalities.  Without chest pain or shortness of breath.  Mild one time small dose of ibuprofen and ativan provided for  generalized mild headache, chronic in nature per pt.  Pt able to swallow liquid PO without difficulty.  Patient is advised to follow up closely with neurologist in regards to today's event.  Well-known hx of non-compliance.  Spoke with patient in detail about driving restrictions until cleared by a neurologist.  Emphasized importance of medication adherence.  Patient  verbalizes understanding.  Answered all questions.  Patient is hemodynamically stable, in good condition, and in NAD prior to discharge.  After consideration of the diagnostic results and the patient's encounter today, I feel that the emergency department workup does not suggest an emergent condition requiring admission or immediate intervention beyond what has been performed at this time.  The patient is safe for discharge and has been instructed to return immediately for worsening symptoms, change in symptoms or any other concerns.  Discussed course of treatment thoroughly with the patient, whom demonstrated understanding.  Patient in agreement and has no further questions.  I discussed this case with my attending physician Dr. Estell Harpin, who agreed with the proposed treatment course and cosigned this note including patient's presenting symptoms, physical exam, and planned diagnostics and interventions.  Attending physician stated agreement with plan or made changes to plan which were implemented.     This chart was dictated using voice recognition software.  Despite best efforts to proofread, errors can occur which can change the documentation meaning.         Final Clinical Impression(s) / ED Diagnoses Final diagnoses:  Seizure (HCC)  Hypokalemia  Alcohol use disorder, severe, dependence (HCC)  Primary hypertension    Rx / DC Orders ED Discharge Orders     None         Sandrea Hammond 06/01/21 1026    Bethann Berkshire, MD 06/03/21 [phone]

## 2021-05-29 NOTE — ED Triage Notes (Signed)
EMS reports from group home.. witnessed seizure. Hx of seizures. Non-compliant wit meds.  BP 148/98 HR 105 RR 16 Sp02 98 RA CBG 141  18ga L hand

## 2021-05-29 NOTE — ED Notes (Signed)
Pt provided bus pass 

## 2021-06-14 ENCOUNTER — Other Ambulatory Visit: Payer: Self-pay

## 2021-06-14 ENCOUNTER — Emergency Department (HOSPITAL_COMMUNITY)
Admission: EM | Admit: 2021-06-14 | Discharge: 2021-06-14 | Disposition: A | Discharge status: Home / Self Care | Payer: Self-pay | Attending: Emergency Medicine | Admitting: Emergency Medicine

## 2021-06-14 ENCOUNTER — Encounter (HOSPITAL_COMMUNITY): Payer: Self-pay | Admitting: *Deleted

## 2021-06-14 ENCOUNTER — Emergency Department (HOSPITAL_COMMUNITY): Payer: Self-pay

## 2021-06-14 DIAGNOSIS — S0031XA Abrasion of nose, initial encounter: Secondary | ICD-10-CM | POA: Insufficient documentation

## 2021-06-14 DIAGNOSIS — W19XXXA Unspecified fall, initial encounter: Secondary | ICD-10-CM

## 2021-06-14 DIAGNOSIS — S0003XA Contusion of scalp, initial encounter: Secondary | ICD-10-CM | POA: Insufficient documentation

## 2021-06-14 DIAGNOSIS — Z23 Encounter for immunization: Secondary | ICD-10-CM | POA: Insufficient documentation

## 2021-06-14 DIAGNOSIS — W1839XA Other fall on same level, initial encounter: Secondary | ICD-10-CM | POA: Insufficient documentation

## 2021-06-14 DIAGNOSIS — F10920 Alcohol use, unspecified with intoxication, uncomplicated: Secondary | ICD-10-CM

## 2021-06-14 LAB — CBC WITH DIFFERENTIAL/PLATELET
Abs Immature Granulocytes: 0.01 10*3/uL (ref 0.00–0.07)
Basophils Absolute: 0.1 10*3/uL (ref 0.0–0.1)
Basophils Relative: 2 %
Eosinophils Absolute: 0.1 10*3/uL (ref 0.0–0.5)
Eosinophils Relative: 2 %
HCT: 35.4 % — ABNORMAL LOW (ref 39.0–52.0)
Hemoglobin: 11.5 g/dL — ABNORMAL LOW (ref 13.0–17.0)
Immature Granulocytes: 0 %
Lymphocytes Relative: 37 %
Lymphs Abs: 1.2 10*3/uL (ref 0.7–4.0)
MCH: 27.3 pg (ref 26.0–34.0)
MCHC: 32.5 g/dL (ref 30.0–36.0)
MCV: 84.1 fL (ref 80.0–100.0)
Monocytes Absolute: 0.3 10*3/uL (ref 0.1–1.0)
Monocytes Relative: 10 %
Neutro Abs: 1.7 10*3/uL (ref 1.7–7.7)
Neutrophils Relative %: 49 %
Platelets: 321 10*3/uL (ref 150–400)
RBC: 4.21 MIL/uL — ABNORMAL LOW (ref 4.22–5.81)
RDW: 20.2 % — ABNORMAL HIGH (ref 11.5–15.5)
WBC: 3.4 10*3/uL — ABNORMAL LOW (ref 4.0–10.5)
nRBC: 0 % (ref 0.0–0.2)

## 2021-06-14 LAB — COMPREHENSIVE METABOLIC PANEL
ALT: 22 U/L (ref 0–44)
AST: 39 U/L (ref 15–41)
Albumin: 3.4 g/dL — ABNORMAL LOW (ref 3.5–5.0)
Alkaline Phosphatase: 107 U/L (ref 38–126)
Anion gap: 14 (ref 5–15)
BUN: 5 mg/dL — ABNORMAL LOW (ref 6–20)
CO2: 21 mmol/L — ABNORMAL LOW (ref 22–32)
Calcium: 8.7 mg/dL — ABNORMAL LOW (ref 8.9–10.3)
Chloride: 102 mmol/L (ref 98–111)
Creatinine, Ser: 0.79 mg/dL (ref 0.61–1.24)
GFR, Estimated: >60 mL/min (ref >60)
Glucose, Bld: 88 mg/dL (ref 70–99)
Potassium: 3.8 mmol/L (ref 3.5–5.1)
Sodium: 137 mmol/L (ref 135–145)
Total Bilirubin: 0.4 mg/dL (ref 0.3–1.2)
Total Protein: 6.9 g/dL (ref 6.5–8.1)

## 2021-06-14 LAB — ETHANOL: Alcohol, Ethyl (B): 467 mg/dL (ref <10)

## 2021-06-14 MED ORDER — TETANUS-DIPHTH-ACELL PERTUSSIS 5-2.5-18.5 LF-MCG/0.5 IM SUSY
0.5000 mL | PREFILLED_SYRINGE | Freq: Once | INTRAMUSCULAR | Status: AC
  Administered 2021-06-14: 0.5 mL via INTRAMUSCULAR
  Filled 2021-06-14: qty 0.5

## 2021-06-14 NOTE — Discharge Instructions (Signed)
Follow up with your doctor in the office.  Return for worsening headache, confusion, vomiting.

## 2021-06-14 NOTE — ED Triage Notes (Signed)
Pt from home with EMS, fall, etoh. Hematoma and laceration to posterior head. A&Ox2. 20g L forearm

## 2021-06-14 NOTE — ED Provider Notes (Signed)
I see the patient in signout from Dr. Dayna Barker, briefly the patient is a 61 year old male with a chief complaints of trauma to the head and altered mental status.  Found to have an alcohol level of 470.  Plan to observe for improved mental status.  Patient's mental status has improved he was able to ambulate independently to the bathroom and has been able to eat and drink here without issue.  Will discharge home.  PCP follow-up.   Deno Etienne, DO 06/14/21 1113

## 2021-06-14 NOTE — ED Provider Notes (Signed)
[facility] EMERGENCY DEPARTMENT Provider Note   CSN: [id number] Arrival date & time: 06/14/21  0358     History  Chief Complaint  Patient presents with   Rodney Hartman    Rodney Hartman is a 61 y.o. male.  61 year old male not able to offer a lot of history that comes the ER for a fall.  Reportedly had been drinking some alcohol.  Review his history he is on Keppra and has a history of multiple seizures in the past.  Patient is unsure what happened.  When asked why he is here he is stated that the emergency room is the phone place to be.   Fall      Home Medications Prior to Admission medications   Medication Sig Start Date End Date Taking? Authorizing Provider  citalopram (CELEXA) 40 MG tablet Take 40 mg by mouth daily. 02/27/21   [provider]  folic acid (FOLVITE) 1 MG tablet Take 1 tablet (1 mg total) by mouth daily. 03/06/21   Alwyn Ren, MD  gabapentin (NEURONTIN) 300 MG capsule Take 300 mg by mouth 3 (three) times daily. 02/27/21   [provider]  hydrochlorothiazide (HYDRODIURIL) 25 MG tablet Take 25 mg by mouth daily. 02/27/21   [provider]  levETIRAcetam (KEPPRA) 500 MG tablet Take 1 tablet (500 mg total) by mouth 2 (two) times daily. 03/24/21   Dione Booze, MD  Multiple Vitamin (MULTIVITAMIN WITH MINERALS) TABS tablet Take 1 tablet by mouth daily. Patient not taking: Reported on 05/04/2021 03/06/21   Alwyn Ren, MD  omeprazole (PRILOSEC) 20 MG capsule Take 1 capsule (20 mg total) by mouth 2 (two) times daily for 14 days. 03/13/21 03/27/21  Lynann Bologna, MD  pantoprazole (PROTONIX) 40 MG tablet Take 1 tablet (40 mg total) by mouth 2 (two) times daily. Patient not taking: Reported on 05/04/2021 03/05/21 03/05/22  Alwyn Ren, MD  thiamine 100 MG tablet Take 1 tablet (100 mg total) by mouth daily. Patient not taking: Reported on 05/04/2021 03/06/21   Alwyn Ren, MD  traZODone (DESYREL) 150 MG tablet  Take 150 mg by mouth at bedtime. 10/05/19   [provider]      Allergies    Patient has no known allergies.    Review of Systems   Review of Systems  Physical Exam Updated Vital Signs BP (!) 150/94 (BP Location: Left Arm)   Pulse 68   Temp 98.4 F (36.9 C) (Oral)   Resp 17   SpO2 99%  Physical Exam Vitals and nursing note reviewed.  Constitutional:      Appearance: He is well-developed.     Comments: Dried blood on front of shirt  Appears to have urine around perineal area on pants  HENT:     Head: Normocephalic.     Comments: Swelling and ecchymosis to bridge of nose.  Dried blood on front of face.  Contusion with abrasion on posterior scalp but c collar obscures bottom 1/3 of scalp. No active bleeding at this time that I can ascertain.     Mouth/Throat:     Mouth: Mucous membranes are dry.  Eyes:     Pupils: Pupils are equal, round, and reactive to light.  Cardiovascular:     Rate and Rhythm: Normal rate.  Pulmonary:     Effort: Pulmonary effort is normal. No respiratory distress.  Abdominal:     General: Abdomen is flat. There is no distension.     Tenderness: There is  no abdominal tenderness.  Musculoskeletal:        General: No tenderness or signs of injury. Normal range of motion.     Cervical back: Normal range of motion.  Skin:    General: Skin is warm and dry.  Neurological:     General: No focal deficit present.     Mental Status: He is alert.    ED Results / Procedures / Treatments   Labs (all labs ordered are listed, but only abnormal results are displayed) Labs Reviewed  CBC WITH DIFFERENTIAL/PLATELET  COMPREHENSIVE METABOLIC PANEL  ETHANOL  LEVETIRACETAM LEVEL    EKG None  Radiology No results found.  Procedures Procedures    Medications Ordered in ED Medications - No data to display  ED Course/ Medical Decision Making/ A&P                           Medical Decision Making Amount and/or Complexity of Data  Reviewed Labs: ordered. Radiology: ordered. ECG/medicine tests: ordered.  Risk Prescription drug management.  Intoxicated vs post ictal vs concussive or possible combo of all three. Will eval for same. Once c spine cleared, will evaluate posterior scalp in more detail.  After ct, c collar removed, no lacerations needign repair. Does have a hematoma with associated abrasion along with another abrasion lower down on posterior scalp as likely source for bleeding. Will allow him to metabolize his alcohol and reassess for safety for discharge or need for further workup.   Care transferred pending reevaluation after clinical sobriety.   Final Clinical Impression(s) / ED Diagnoses Final diagnoses:  None    Rx / DC Orders ED Discharge Orders     None         Korissa Horsford, Barbara Cower, MD 06/14/21 2319

## 2021-06-15 LAB — LEVETIRACETAM LEVEL: Levetiracetam Lvl: <2 ug/mL — ABNORMAL LOW (ref 10.0–40.0)

## 2021-07-08 DEATH — deceased
